# Patient Record
Sex: Male | Born: 1970
Health system: Southern US, Community
[De-identification: ages and names within clinical notes are randomized; demographics above are authoritative.]

## PROBLEM LIST (undated history)

## (undated) DIAGNOSIS — R000 Tachycardia, unspecified: Secondary | ICD-10-CM

## (undated) DIAGNOSIS — F419 Anxiety disorder, unspecified: Secondary | ICD-10-CM

## (undated) DIAGNOSIS — I471 Supraventricular tachycardia, unspecified: Secondary | ICD-10-CM

## (undated) DIAGNOSIS — I519 Heart disease, unspecified: Secondary | ICD-10-CM

## (undated) DIAGNOSIS — F32A Depression, unspecified: Secondary | ICD-10-CM

## (undated) DIAGNOSIS — T8859XA Other complications of anesthesia, initial encounter: Secondary | ICD-10-CM

## (undated) DIAGNOSIS — F329 Major depressive disorder, single episode, unspecified: Secondary | ICD-10-CM

## (undated) DIAGNOSIS — M1990 Unspecified osteoarthritis, unspecified site: Secondary | ICD-10-CM

## (undated) DIAGNOSIS — E785 Hyperlipidemia, unspecified: Secondary | ICD-10-CM

## (undated) DIAGNOSIS — F319 Bipolar disorder, unspecified: Secondary | ICD-10-CM

## (undated) DIAGNOSIS — J45909 Unspecified asthma, uncomplicated: Secondary | ICD-10-CM

## (undated) DIAGNOSIS — I10 Essential (primary) hypertension: Secondary | ICD-10-CM

## (undated) DIAGNOSIS — K602 Anal fissure, unspecified: Secondary | ICD-10-CM

## (undated) DIAGNOSIS — M5030 Other cervical disc degeneration, unspecified cervical region: Secondary | ICD-10-CM

## (undated) DIAGNOSIS — I2510 Atherosclerotic heart disease of native coronary artery without angina pectoris: Secondary | ICD-10-CM

## (undated) DIAGNOSIS — T4145XA Adverse effect of unspecified anesthetic, initial encounter: Secondary | ICD-10-CM

## (undated) DIAGNOSIS — F99 Mental disorder, not otherwise specified: Secondary | ICD-10-CM

## (undated) DIAGNOSIS — D649 Anemia, unspecified: Secondary | ICD-10-CM

## (undated) DIAGNOSIS — K219 Gastro-esophageal reflux disease without esophagitis: Secondary | ICD-10-CM

## (undated) HISTORY — DX: Other cervical disc degeneration, unspecified cervical region: M50.30

## (undated) HISTORY — DX: Hyperlipidemia, unspecified: E78.5

## (undated) HISTORY — DX: Anal fissure, unspecified: K60.2

## (undated) HISTORY — PX: CARDIAC CATHETERIZATION: SHX172

## (undated) HISTORY — DX: Unspecified osteoarthritis, unspecified site: M19.90

## (undated) HISTORY — PX: MECHANICAL AORTIC VALVE REPLACEMENT: SHX2013

## (undated) HISTORY — DX: Supraventricular tachycardia: I47.1

## (undated) HISTORY — DX: Anemia, unspecified: D64.9

## (undated) HISTORY — DX: Gastro-esophageal reflux disease without esophagitis: K21.9

## (undated) HISTORY — PX: BONE TUMOR EXCISION: SHX1254

## (undated) HISTORY — DX: Supraventricular tachycardia, unspecified: I47.10

---

## 2000-11-15 ENCOUNTER — Encounter: Payer: Self-pay | Admitting: Emergency Medicine

## 2000-11-15 ENCOUNTER — Emergency Department (HOSPITAL_COMMUNITY): Admission: EM | Admit: 2000-11-15 | Discharge: 2000-11-15 | Payer: Self-pay | Admitting: Emergency Medicine

## 2002-05-08 ENCOUNTER — Inpatient Hospital Stay (HOSPITAL_COMMUNITY): Admission: EM | Admit: 2002-05-08 | Discharge: 2002-05-14 | Payer: Self-pay | Admitting: Psychiatry

## 2003-05-06 ENCOUNTER — Emergency Department (HOSPITAL_COMMUNITY): Admission: EM | Admit: 2003-05-06 | Discharge: 2003-05-07 | Payer: Self-pay | Admitting: Emergency Medicine

## 2003-05-07 ENCOUNTER — Encounter: Payer: Self-pay | Admitting: Emergency Medicine

## 2003-12-14 ENCOUNTER — Ambulatory Visit (HOSPITAL_COMMUNITY): Admission: RE | Admit: 2003-12-14 | Discharge: 2003-12-14 | Payer: Self-pay | Admitting: Gastroenterology

## 2004-01-12 ENCOUNTER — Ambulatory Visit (HOSPITAL_COMMUNITY): Admission: RE | Admit: 2004-01-12 | Discharge: 2004-01-12 | Payer: Self-pay | Admitting: General Surgery

## 2004-05-16 ENCOUNTER — Emergency Department (HOSPITAL_COMMUNITY): Admission: EM | Admit: 2004-05-16 | Discharge: 2004-05-16 | Payer: Self-pay | Admitting: Emergency Medicine

## 2004-08-15 ENCOUNTER — Emergency Department (HOSPITAL_COMMUNITY): Admission: EM | Admit: 2004-08-15 | Discharge: 2004-08-15 | Payer: Self-pay | Admitting: Family Medicine

## 2004-08-15 ENCOUNTER — Ambulatory Visit (HOSPITAL_COMMUNITY): Admission: RE | Admit: 2004-08-15 | Discharge: 2004-08-15 | Payer: Self-pay | Admitting: Family Medicine

## 2004-11-01 ENCOUNTER — Emergency Department (HOSPITAL_COMMUNITY): Admission: EM | Admit: 2004-11-01 | Discharge: 2004-11-01 | Payer: Self-pay | Admitting: Family Medicine

## 2007-08-04 ENCOUNTER — Ambulatory Visit: Payer: Self-pay | Admitting: *Deleted

## 2007-08-04 ENCOUNTER — Inpatient Hospital Stay (HOSPITAL_COMMUNITY): Admission: RE | Admit: 2007-08-04 | Discharge: 2007-08-10 | Payer: Self-pay | Admitting: *Deleted

## 2008-03-09 ENCOUNTER — Emergency Department (HOSPITAL_COMMUNITY): Admission: EM | Admit: 2008-03-09 | Discharge: 2008-03-09 | Payer: Self-pay | Admitting: Family Medicine

## 2011-03-02 NOTE — Op Note (Signed)
NAMEMarland Kitchen  COPE, MARTE                      ACCOUNT NO.:  [id number]   MEDICAL RECORD NO.:  [id number]                   PATIENT TYPE:  AMB   LOCATION:  DAY                                  FACILITY:  [facility]   PHYSICIAN:  Anselm Pancoast. Zachery Dakins, M.D.          DATE OF BIRTH:  [birth year]   DATE OF PROCEDURE:  01/12/2004  DATE OF DISCHARGE:                                 OPERATIVE REPORT   PREOPERATIVE DIAGNOSIS:  Acute and chronic posterior anal fissure, internal.   POSTOPERATIVE DIAGNOSIS:  Acute and chronic posterior anal fissure,  internal.   OPERATION:  Internal sphincterotomy, excision of anal fissure.   General anesthesia, lithotomy position.   HISTORY:  Max Ward is a 40 year old male who was seen in our office  approximately a week ago by [employer]. Young, M.D., for anal pain and a  posterior fissure was diagnosed.  The patient was given pain medication.  It  did not appear to be improving.  He is on Zoloft for his chronic problems  with anxiety and also Xanax and stated that he had had a hard bowel movement  when the symptoms occurred.  Approximately three or four months ago he had  been seen by Dr. Arlyce Dice, who had done a Botox injection after colonoscopy  for the fissure, and the patient stated that it was better for a few days  but that the symptoms recurred.  Since then he has just had acute recurring  symptoms.  He does [employer] work.  I suggested he receive an internal  sphincterotomy.  The patient has a history of cardiac problems as a child  and I talked with Dr. Aleen Campi, his cardiologist, and he recommended that we  cover him with Unasyn.  He said that he had had a rhythm problem and had had  an ablation, and he had a bicuspid valve, but he did not think any more than  just antibiotic coverage immediately around surgery was needed and it would  be safe to proceed with a general anesthesia as an outpatient.  He was  scheduled for today.   Preoperatively  he was given 3 g of Unasyn.  The patient was quite anxious,  and this goes along with his chronic problems.  He was taken back to the  operative suite, induction of general anesthesia, endotracheal tube, and  then placed in a lithotomy position.  After the patient was fully asleep and  the sphincter relaxed, you could see a posterior acute fissure with a little  sentinel tag posterior.  He stated that this little bump bothered him and he  wanted it excised.  As far as the fissure itself did not look all that  horrible, but I thought that a left lateral sphincterotomy would be better  than actually dividing the sphincter posterior.  A little incision was made  and the internal sphincter was elevated over a hemostat and then divided  with cautery.  There was  a little bleeding that was down deep in the wound.  I could not see it, so I opened the area over the little area where there  was a hemorrhoid, then could visualize the sphincter, the bleeder in the  sphincter.  This was coagulated and sutured with a 2-0 chromic.  After  hemostasis had been obtained, I then closed this little incision, the  incision was probably three-quarters of an inch in length, with two figure-  of-eight sutures of 2-0 chromic and then a 3-0 chromic on the exterior  portion.  I did not actually remove the little hemorrhoid that appears to  have been more irritated by the sphincter spasm than actually significant  hemorrhoids.  Next I directed our attention over to this little posterior  anal tag and removed it.  There was actually two little fissures kind of  right there together, and cleaned the little area with debridement, and then  I actually sutured the mucosal edges with two sutures of 2-0 chromic to  hopefully speed up the postoperative healing period.  I then inspected the  area with an anoscope.  Did put one additional 3-0 chromic suture in the  little area  where I had done the sphincterotomy and then  anesthetized the sphincter with  about 20 mL of Marcaine with adrenalin for postoperative pain relief.  It  appeared that the sphincter internal portion is adequately divided as it was  not tight, and waking up, and we will release him after a short stay in the  recovery room.                                               Anselm Pancoast. Zachery Dakins, M.D.    WJW/MEDQ  D:  01/12/2004  T:  01/12/2004  Job:  [id number]   cc:   Barbette Hair. Arlyce Dice, M.D. [facility]
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
## 2011-07-25 LAB — URINE DRUGS OF ABUSE SCREEN W ALC, ROUTINE (REF LAB)
Barbiturate Quant, Ur: NEGATIVE
Benzodiazepines.: POSITIVE — AB
Cocaine Metabolites: NEGATIVE
Ethyl Alcohol: <5
Methadone: NEGATIVE
Phencyclidine (PCP): NEGATIVE

## 2011-07-25 LAB — BENZODIAZEPINE, QUANTITATIVE, URINE
Alprazolam (GC/LC/MS), ur confirm: 56 ng/mL
Flurazepam GC/MS Conf: NEGATIVE
Oxazepam GC/MS Conf: NEGATIVE

## 2011-07-25 LAB — URINALYSIS, ROUTINE W REFLEX MICROSCOPIC
Bilirubin Urine: NEGATIVE
Glucose, UA: NEGATIVE
Ketones, ur: NEGATIVE
pH: 6

## 2011-07-25 LAB — CBC
HCT: 45.1
Hemoglobin: 14.9
MCHC: 33.1
RDW: 11.6

## 2011-07-25 LAB — TSH: TSH: 2.521

## 2011-07-25 LAB — COMPREHENSIVE METABOLIC PANEL
BUN: 13
Calcium: 9.3
Creatinine, Ser: 1.04
Glucose, Bld: 98
Total Protein: 6.6

## 2011-08-09 DIAGNOSIS — R4589 Other symptoms and signs involving emotional state: Secondary | ICD-10-CM | POA: Insufficient documentation

## 2011-08-09 DIAGNOSIS — I471 Supraventricular tachycardia: Secondary | ICD-10-CM | POA: Insufficient documentation

## 2011-08-09 DIAGNOSIS — R079 Chest pain, unspecified: Secondary | ICD-10-CM | POA: Insufficient documentation

## 2011-08-09 DIAGNOSIS — I4719 Other supraventricular tachycardia: Secondary | ICD-10-CM | POA: Insufficient documentation

## 2011-08-09 DIAGNOSIS — F319 Bipolar disorder, unspecified: Secondary | ICD-10-CM | POA: Diagnosis present

## 2011-08-09 DIAGNOSIS — Z720 Tobacco use: Secondary | ICD-10-CM | POA: Insufficient documentation

## 2011-08-09 DIAGNOSIS — F419 Anxiety disorder, unspecified: Secondary | ICD-10-CM | POA: Insufficient documentation

## 2012-01-17 ENCOUNTER — Emergency Department (HOSPITAL_COMMUNITY)
Admission: EM | Admit: 2012-01-17 | Discharge: 2012-01-18 | Disposition: A | Discharge status: Other Acute Inpatient Hospital | Payer: Medicare Other | Source: Home / Self Care | Attending: Emergency Medicine | Admitting: Emergency Medicine

## 2012-01-17 ENCOUNTER — Encounter (HOSPITAL_COMMUNITY): Payer: Self-pay | Admitting: Licensed Clinical Social Worker

## 2012-01-17 ENCOUNTER — Other Ambulatory Visit: Payer: Self-pay

## 2012-01-17 ENCOUNTER — Encounter (HOSPITAL_COMMUNITY): Payer: Self-pay

## 2012-01-17 DIAGNOSIS — R45851 Suicidal ideations: Secondary | ICD-10-CM | POA: Insufficient documentation

## 2012-01-17 DIAGNOSIS — F319 Bipolar disorder, unspecified: Secondary | ICD-10-CM | POA: Insufficient documentation

## 2012-01-17 HISTORY — DX: Anxiety disorder, unspecified: F41.9

## 2012-01-17 HISTORY — DX: Atherosclerotic heart disease of native coronary artery without angina pectoris: I25.10

## 2012-01-17 HISTORY — DX: Tachycardia, unspecified: R00.0

## 2012-01-17 HISTORY — DX: Bipolar disorder, unspecified: F31.9

## 2012-01-17 LAB — COMPREHENSIVE METABOLIC PANEL
Albumin: 3.9 g/dL (ref 3.5–5.2)
Alkaline Phosphatase: 66 U/L (ref 39–117)
BUN: 11 mg/dL (ref 6–23)
Chloride: 103 mEq/L (ref 96–112)
Potassium: 3.7 mEq/L (ref 3.5–5.1)
Total Bilirubin: 0.4 mg/dL (ref 0.3–1.2)

## 2012-01-17 LAB — RAPID URINE DRUG SCREEN, HOSP PERFORMED
Amphetamines: NOT DETECTED
Barbiturates: NOT DETECTED
Tetrahydrocannabinol: NOT DETECTED

## 2012-01-17 LAB — CBC
HCT: 46 % (ref 39.0–52.0)
Hemoglobin: 16.1 g/dL (ref 13.0–17.0)
RDW: 12.3 % (ref 11.5–15.5)
WBC: 7.7 10*3/uL (ref 4.0–10.5)

## 2012-01-17 LAB — ETHANOL: Alcohol, Ethyl (B): <11 mg/dL (ref 0–11)

## 2012-01-17 MED ORDER — LORAZEPAM 1 MG PO TABS
1.0000 mg | ORAL_TABLET | Freq: Three times a day (TID) | ORAL | Status: DC | PRN
  Administered 2012-01-17: 1 mg via ORAL
  Filled 2012-01-17: qty 1

## 2012-01-17 MED ORDER — ONDANSETRON HCL 4 MG PO TABS: 4.0000 mg | ORAL_TABLET | Freq: Three times a day (TID) | ORAL | Status: DC | PRN

## 2012-01-17 MED ORDER — ACETAMINOPHEN 325 MG PO TABS: 650.0000 mg | ORAL_TABLET | ORAL | Status: DC | PRN

## 2012-01-17 MED ORDER — NICOTINE 21 MG/24HR TD PT24
21.0000 mg | MEDICATED_PATCH | Freq: Every day | TRANSDERMAL | Status: DC
  Administered 2012-01-17 – 2012-01-18 (×2): 21 mg via TRANSDERMAL
  Filled 2012-01-17 (×2): qty 1

## 2012-01-17 MED ORDER — ZOLPIDEM TARTRATE 5 MG PO TABS
5.0000 mg | ORAL_TABLET | Freq: Every evening | ORAL | Status: DC | PRN
  Administered 2012-01-17: 5 mg via ORAL
  Filled 2012-01-17: qty 1

## 2012-01-17 MED ORDER — PANTOPRAZOLE SODIUM 40 MG PO TBEC
40.0000 mg | DELAYED_RELEASE_TABLET | Freq: Every day | ORAL | Status: DC
  Administered 2012-01-17 – 2012-01-18 (×2): 40 mg via ORAL
  Filled 2012-01-17 (×2): qty 1

## 2012-01-17 MED ORDER — IBUPROFEN 600 MG PO TABS
600.0000 mg | ORAL_TABLET | Freq: Three times a day (TID) | ORAL | Status: DC | PRN
  Administered 2012-01-17: 600 mg via ORAL
  Filled 2012-01-17: qty 1

## 2012-01-17 MED ORDER — QUETIAPINE FUMARATE 100 MG PO TABS
900.0000 mg | ORAL_TABLET | Freq: Every day | ORAL | Status: DC
  Administered 2012-01-17: 900 mg via ORAL
  Filled 2012-01-17: qty 9

## 2012-01-17 MED ORDER — DIVALPROEX SODIUM ER 500 MG PO TB24
1000.0000 mg | ORAL_TABLET | Freq: Every day | ORAL | Status: DC
  Administered 2012-01-17: 1000 mg via ORAL
  Filled 2012-01-17 (×2): qty 2

## 2012-01-17 NOTE — ED Notes (Signed)
Pt. Belongings locked up in [place] [address] near the psy-ed. Pt. Has 1 belongings bag and 1 green bag.
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
## 2012-01-17 NOTE — ED Notes (Signed)
EKG given to EDP, Pickering, MD. 
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
## 2012-01-17 NOTE — ED Provider Notes (Signed)
History     CSN: [id number]  Arrival date & time 01/17/12  1946   First MD Initiated Contact with Patient 01/17/12 2055      Chief Complaint  Patient presents with  . Medical Clearance    (Consider location/radiation/quality/duration/timing/severity/associated sxs/prior treatment) Patient is a 41 y.o. male presenting with mental health disorder. The history is provided by the patient.  Mental Health Problem  Pt states he has had a lot of depression. States having problems with his x-wife and kids. States recently has had a lot more anxiety, anger and thoughts about hurting himself. States he was unable to eat or drink for last week because he is worried about everything. States went to [facility], sent here for med clearance. Pt denies medical problems. No other complaints.   Past Medical History  Diagnosis Date  . Heart disease   . Anxiety   . Coronary artery disease   . Bipolar 1 disorder   . Tachycardia     Past Surgical History  Procedure Date  . Cardiac catheterization     No family history on file.  History  Substance Use Topics  . Smoking status: Current Everyday Smoker -- 1.0 packs/day for 15 years  . Smokeless tobacco: Not on file  . Alcohol Use: Yes      Review of Systems  Constitutional: Negative for fever and chills.  HENT: Negative.   Eyes: Negative.   Respiratory: Negative.   Cardiovascular: Negative.   Gastrointestinal: Negative.   Genitourinary: Negative.   Musculoskeletal: Negative.   Skin: Negative.   Neurological: Negative.   Psychiatric/Behavioral: Positive for behavioral problems. The patient is nervous/anxious.     Allergies  Review of patient's allergies indicates no known allergies.  Home Medications   Current Outpatient Rx  Name Route Sig Dispense Refill  . ACETAMINOPHEN 325 MG PO TABS Oral Take 650 mg by mouth every 6 (six) hours as needed. pain    . ALPRAZOLAM 1 MG PO TABS Oral Take 1-3 mg by mouth See admin instructions. Takes 1  tablet tid ,and takes 3 tablets at bedtime    . DIVALPROEX SODIUM ER 500 MG PO TB24 Oral Take 1,000 mg by mouth at bedtime.    Marland Kitchen ESOMEPRAZOLE MAGNESIUM 40 MG PO CPDR Oral Take 40 mg by mouth 2 (two) times daily.    . MOMETASONE FUROATE 50 MCG/ACT NA SUSP Nasal Place 1-2 sprays into the nose daily.    . OXYCODONE-ACETAMINOPHEN 7.5-325 MG PO TABS Oral Take 1 tablet by mouth every 6 (six) hours as needed. pain    . QUETIAPINE FUMARATE 300 MG PO TABS Oral Take 900 mg by mouth at bedtime.    Marland Kitchen RAMELTEON 8 MG PO TABS Oral Take 8 mg by mouth at bedtime.      BP 132/93  Pulse 99  Temp(Src) 97.9 F (36.6 C) (Oral)  Resp 20  SpO2 100%  Physical Exam  Nursing note and vitals reviewed. Constitutional: He is oriented to person, place, and time. He appears well-developed and well-nourished. No distress.  HENT:  Head: Normocephalic.  Eyes: Conjunctivae are normal.  Neck: Neck supple.  Cardiovascular: Normal rate, regular rhythm and normal heart sounds.   Pulmonary/Chest: Effort normal and breath sounds normal. No respiratory distress.  Abdominal: Soft. Bowel sounds are normal. There is no tenderness.  Neurological: He is alert and oriented to person, place, and time.  Skin: Skin is warm and dry.  Psychiatric: His speech is normal. Judgment and thought content normal. His mood appears  anxious. Cognition and memory are normal. He exhibits a depressed mood.    ED Course  Procedures (including critical care time)  Results for orders placed during the hospital encounter of 01/17/12  CBC      Component Value Range   WBC 7.7  4.0 - 10.5 (K/uL)   RBC 5.26  4.22 - 5.81 (MIL/uL)   Hemoglobin 16.1  13.0 - 17.0 (g/dL)   HCT 40.9  81.1 - 91.4 (%)   MCV 87.5  78.0 - 100.0 (fL)   MCH 30.6  26.0 - 34.0 (pg)   MCHC 35.0  30.0 - 36.0 (g/dL)   RDW 78.2  95.6 - 21.3 (%)   Platelets 251  150 - 400 (K/uL)  COMPREHENSIVE METABOLIC PANEL      Component Value Range   Sodium 137  135 - 145 (mEq/L)    Potassium 3.7  3.5 - 5.1 (mEq/L)   Chloride 103  96 - 112 (mEq/L)   CO2 25  19 - 32 (mEq/L)   Glucose, Bld 109 (*) 70 - 99 (mg/dL)   BUN 11  6 - 23 (mg/dL)   Creatinine, Ser 0.86  0.50 - 1.35 (mg/dL)   Calcium 9.1  8.4 - 57.8 (mg/dL)   Total Protein 6.9  6.0 - 8.3 (g/dL)   Albumin 3.9  3.5 - 5.2 (g/dL)   AST 12  0 - 37 (U/L)   ALT 18  0 - 53 (U/L)   Alkaline Phosphatase 66  39 - 117 (U/L)   Total Bilirubin 0.4  0.3 - 1.2 (mg/dL)   GFR calc non Af Amer >90  >90 (mL/min)   GFR calc Af Amer >90  >90 (mL/min)  ETHANOL      Component Value Range   Alcohol, Ethyl (B) <11  0 - 11 (mg/dL)   No results found.  9:40 PM pts vitals are normal. He is in no distressed. Spoke with ACT. Pt has already been assessed by [facility]. Accepted? But No beds at this time.   No diagnosis found.    MDM          Lottie Mussel, PA 01/21/12 1547

## 2012-01-18 ENCOUNTER — Inpatient Hospital Stay (HOSPITAL_COMMUNITY)
Admission: RE | Admit: 2012-01-18 | Discharge: 2012-01-25 | DRG: 885 | Disposition: A | Discharge status: Home / Self Care | Payer: Medicare Other | Attending: Psychiatry | Admitting: Psychiatry

## 2012-01-18 ENCOUNTER — Encounter (HOSPITAL_COMMUNITY): Payer: Self-pay | Admitting: *Deleted

## 2012-01-18 DIAGNOSIS — R45851 Suicidal ideations: Secondary | ICD-10-CM

## 2012-01-18 DIAGNOSIS — I2510 Atherosclerotic heart disease of native coronary artery without angina pectoris: Secondary | ICD-10-CM | POA: Diagnosis present

## 2012-01-18 DIAGNOSIS — G4700 Insomnia, unspecified: Secondary | ICD-10-CM | POA: Diagnosis present

## 2012-01-18 DIAGNOSIS — F250 Schizoaffective disorder, bipolar type: Secondary | ICD-10-CM

## 2012-01-18 DIAGNOSIS — F259 Schizoaffective disorder, unspecified: Principal | ICD-10-CM | POA: Diagnosis present

## 2012-01-18 DIAGNOSIS — F319 Bipolar disorder, unspecified: Secondary | ICD-10-CM | POA: Diagnosis present

## 2012-01-18 DIAGNOSIS — F17200 Nicotine dependence, unspecified, uncomplicated: Secondary | ICD-10-CM | POA: Diagnosis present

## 2012-01-18 DIAGNOSIS — F411 Generalized anxiety disorder: Secondary | ICD-10-CM | POA: Diagnosis present

## 2012-01-18 HISTORY — DX: Heart disease, unspecified: I51.9

## 2012-01-18 HISTORY — DX: Mental disorder, not otherwise specified: F99

## 2012-01-18 HISTORY — DX: Depression, unspecified: F32.A

## 2012-01-18 HISTORY — DX: Major depressive disorder, single episode, unspecified: F32.9

## 2012-01-18 LAB — VALPROIC ACID LEVEL: Valproic Acid Lvl: 29.6 ug/mL — ABNORMAL LOW (ref 50.0–100.0)

## 2012-01-18 MED ORDER — QUETIAPINE FUMARATE 400 MG PO TABS
800.0000 mg | ORAL_TABLET | Freq: Every day | ORAL | Status: DC
  Administered 2012-01-18 – 2012-01-20 (×3): 800 mg via ORAL
  Filled 2012-01-18 (×4): qty 2

## 2012-01-18 MED ORDER — PANTOPRAZOLE SODIUM 40 MG PO TBEC
40.0000 mg | DELAYED_RELEASE_TABLET | Freq: Every day | ORAL | Status: DC
  Administered 2012-01-18 – 2012-01-24 (×7): 40 mg via ORAL
  Filled 2012-01-18: qty 1
  Filled 2012-01-18: qty 14
  Filled 2012-01-18 (×4): qty 1
  Filled 2012-01-18: qty 14
  Filled 2012-01-18 (×3): qty 1

## 2012-01-18 MED ORDER — MAGNESIUM HYDROXIDE 400 MG/5ML PO SUSP: 30.0000 mL | Freq: Every day | ORAL | Status: DC | PRN

## 2012-01-18 MED ORDER — ALUM & MAG HYDROXIDE-SIMETH 200-200-20 MG/5ML PO SUSP: 30.0000 mL | ORAL | Status: DC | PRN

## 2012-01-18 MED ORDER — ACETAMINOPHEN 325 MG PO TABS: 650.0000 mg | ORAL_TABLET | Freq: Four times a day (QID) | ORAL | Status: DC | PRN

## 2012-01-18 MED ORDER — QUETIAPINE FUMARATE 300 MG PO TABS: 900.0000 mg | ORAL_TABLET | Freq: Every day | ORAL | Status: DC

## 2012-01-18 MED ORDER — DIVALPROEX SODIUM ER 500 MG PO TB24
1000.0000 mg | ORAL_TABLET | Freq: Every day | ORAL | Status: DC
  Administered 2012-01-18 – 2012-01-20 (×3): 1000 mg via ORAL
  Filled 2012-01-18 (×4): qty 2

## 2012-01-18 MED ORDER — ACETAMINOPHEN 325 MG PO TABS
650.0000 mg | ORAL_TABLET | Freq: Four times a day (QID) | ORAL | Status: DC | PRN
  Administered 2012-01-21: 650 mg via ORAL

## 2012-01-18 MED ORDER — RAMELTEON 8 MG PO TABS
8.0000 mg | ORAL_TABLET | Freq: Every day | ORAL | Status: DC
  Administered 2012-01-18 – 2012-01-20 (×3): 8 mg via ORAL
  Filled 2012-01-18 (×4): qty 1

## 2012-01-18 MED ORDER — TRAZODONE HCL 50 MG PO TABS
150.0000 mg | ORAL_TABLET | Freq: Every evening | ORAL | Status: DC | PRN
  Administered 2012-01-18: 150 mg via ORAL
  Filled 2012-01-18: qty 3

## 2012-01-18 NOTE — Progress Notes (Addendum)
Patient ID: Max Ward, male   DOB: Sep 10, 1971, 41 y.o.   MRN: [id number] Pt is awake and active on the unit this PM. Pt denies SI/HI and AVH. Pt is interacting well in the milieu and is cooperative with staff. Pt c/o non compliance with his medications due to weight gain and would like to learn about alternatives. Pt mood and affect are appropriate to the situation. Writer will continue to monitor.
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
## 2012-01-18 NOTE — ED Provider Notes (Signed)
Patient is here with complaints of depression and suicidal ideation. Patient still awaiting evaluation by the psychiatric team.  Filed Vitals:   01/18/12 0511  BP: 120/72  Pulse: 90  Temp: 97.9 F (36.6 C)  Resp: 20   Heart: Regular rate and rhythm Lungs: Clear to auscultation  We are awaiting evaluation by the ACT team. Plan at this time is for psychiatric admission  Celene Kras, MD 01/18/12 [phone]

## 2012-01-18 NOTE — ED Notes (Signed)
Pt accepted to [facility]. Verne Spurr, PA to Dr. Allena Katz ([phone]). Completed support documentation. Updated RN & EDP.

## 2012-01-19 DIAGNOSIS — F3160 Bipolar disorder, current episode mixed, unspecified: Secondary | ICD-10-CM

## 2012-01-19 LAB — TSH: TSH: 1.735 u[IU]/mL (ref 0.350–4.500)

## 2012-01-19 MED ORDER — CHLORDIAZEPOXIDE HCL 25 MG PO CAPS
25.0000 mg | ORAL_CAPSULE | Freq: Four times a day (QID) | ORAL | Status: DC | PRN
  Administered 2012-01-21 – 2012-01-25 (×4): 25 mg via ORAL
  Filled 2012-01-19 (×4): qty 1

## 2012-01-19 MED ORDER — NICOTINE 21 MG/24HR TD PT24
21.0000 mg | MEDICATED_PATCH | Freq: Every day | TRANSDERMAL | Status: DC
  Administered 2012-01-19: 21 mg via TRANSDERMAL
  Filled 2012-01-19 (×4): qty 1

## 2012-01-19 MED ORDER — TRAZODONE HCL 100 MG PO TABS
200.0000 mg | ORAL_TABLET | Freq: Every evening | ORAL | Status: DC | PRN
  Administered 2012-01-19 – 2012-01-20 (×2): 200 mg via ORAL
  Filled 2012-01-19 (×2): qty 2

## 2012-01-19 NOTE — Progress Notes (Signed)
Pt slept most of the morning   He is up active on the milue  His thinking is logical and coherent  His interactions are appropriate  He is not suicidal or homicidal  And denies psychotic symptoms   Verbal support given  Medications administered and effectiveness monitored  Q 15 min checks  Pt safe at present

## 2012-01-19 NOTE — Progress Notes (Signed)
[facility] Group Notes:  (Counselor/Nursing/MHT/Case Management/Adjunct)  01/19/2012 3:54 PM  Type of Therapy:  Group Therapy  Participation Level:  Active  Participation Quality:  Appropriate, Attentive and Sharing  Affect:  Appropriate  Cognitive:  Appropriate  Insight:  Good  Engagement in Group:  Good  Engagement in Therapy:  Good  Modes of Intervention:  Clarification, Problem-solving, Socialization and Support  Summary of Progress/Problems: pt. Participated in counseling group on self sabotaging behaviors and discussed how he tends to live in the past and is unable to move forward. Pt. Discussed changing negative thoughts to positive and moving forward instead of looking back in the past.   Neila Gear 01/19/2012, 3:54 PM
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
## 2012-01-19 NOTE — Progress Notes (Signed)
[facility] Group Notes:  (Counselor/Nursing/MHT/Case Management/Adjunct)  01/19/2012 3:47 PM  Type of Therapy:  Discharge Planning  Participation Level:  Active  Participation Quality:  Appropriate and Attentive  Affect:  Appropriate  Cognitive:  Appropriate  Insight:  Good  Engagement in Group:  Good  Engagement in Therapy:  Good  Modes of Intervention:  Clarification, Problem-solving, Socialization and   Summary of Progress/Problems: Pt. . participated in after care planning group and was given the [facility]  pamphlet and crisis and hotline numbers and agreed to use them if needed. The pt. Stated he came from [facility] to [facility] yesterday. Pt. stated he was having an okay day and talked about his medications and his issues with Bipolar.   Neila Gear 01/19/2012, 3:47 PM
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
## 2012-01-19 NOTE — Progress Notes (Signed)
Pt in bed when I took over his care at 2330.  Pt unable to sleep, called doctor and received order.  Pt pleasant and cooperative.  Did stated that he had tried Trazodone before and it stopped up his nose. Did go ahead and take it tonight because he sated "I have to get some sleep".  Denies SI/HI/hallucinations at this time.  Support and encouragement offered, will continue to monitor.

## 2012-01-19 NOTE — Progress Notes (Signed)
Writer observed patient sitting in the dayroom watching tv. Writer met with patient 1:1 and informed patient of his medications and pt. C/o insomnia. Patient was informed of prn of trazadone available and patient requested this along with his scheduled hs medications. Writer encouraged patient to take his hs medications first and if he still had trouble sleeping or woke up later on he would have trazadone available but he requested trazadone with his hs medications. Patient currently denies having pain, -si/hi/a/v hall. Safety maintained on unit, will continue to monitor.

## 2012-01-20 MED ORDER — NICOTINE 21 MG/24HR TD PT24
21.0000 mg | MEDICATED_PATCH | Freq: Every day | TRANSDERMAL | Status: DC
  Filled 2012-01-20: qty 1

## 2012-01-20 NOTE — Progress Notes (Signed)
Pt. attended and participated in aftercare planning group. Pt. accepted information on suicide prevention, warning signs to look for with suicide and crisis line numbers to use. The pt. agreed to call crisis line numbers if having warning signs or having thoughts of suicide. Pt. listed their current energy level as "middle". He avoided eye contact and was unable to stay in group very long.

## 2012-01-20 NOTE — Progress Notes (Addendum)
[facility] Group Notes:  (Counselor/Nursing/MHT/Case Management/Adjunct)  01/20/2012 10:30 AM  Type of Therapy:  Group Therapy, Dance/Movement Therapy   Participation Level:  Minimal  Participation Quality:  Drowsy and Supportive  Affect:  Depressed  Cognitive:  Confused  Insight:  Limited  Engagement in Group:  Limited  Engagement in Therapy:  None  Modes of Intervention:  Clarification, Problem-solving, Role-play, Socialization and Support  Summary of Progress/Problems: pt spoke about supports to use at D/C. Pt was confused and unable to stay in group for very long and left after a few minutes to go "ay down".    Gevena Mart
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
## 2012-01-20 NOTE — Progress Notes (Signed)
Pt has been pleasant and appropriate   He attends and participates in groups  He denies suicidal and homicidal ideation   He is cooperative with treatment and is logical and coherent   Verbal suppoprt given  Medications administered and effectiveness monitored  Q 15 min checks  Pt safe at present

## 2012-01-20 NOTE — H&P (Signed)
I have read the H&P, interviewed the patient, and I agree with the findings above.  Iyannah Blake, MD   
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
## 2012-01-20 NOTE — Progress Notes (Signed)
[facility] Adult Inpatient Family/Significant Other Suicide Prevention Education  Suicide Prevention Education:  Education Completed; John Hutherson-brother-[phone]- has been identified by the patient as the family member/significant other with whom the patient will be residing, and identified as the person(s) who will aid the patient in the event of a mental health crisis (suicidal ideations/suicide attempt).  With written consent from the patient, the family member/significant other has been provided the following suicide prevention education, prior to the and/or following the discharge of the patient.  The suicide prevention education provided includes the following:  Suicide risk factors  Suicide prevention and interventions  National Suicide Hotline telephone number  [facility] Inpatient assessment telephone number  [facility] Emergency Assistance 911  [facility] and/or Residential Mobile Crisis Unit telephone number  Request made of family/significant other to:  Remove weapons (e.g., guns, rifles, knives), all items previously/currently identified as safety concern. Pt.'s brother states that there are no guns in the home.   Remove drugs/medications (over-the-counter, prescriptions, illicit drugs), all items previously/currently identified as a safety concern. Pt.'s brother  states he has no concerns and will secure the home before he leaves for [place] today for his job.   Pt.'s brother will be leaving to go out of town for his job in [place]. Pt.'s brother reports past SI attempt  and SI thoughts in past with the pt.  The family member/significant other verbalizes understanding of the suicide prevention education information provided.  The family member/significant other agrees to remove the items of safety concern listed above.  Lamar Blinks [place] 01/20/2012, 12:18 PM
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
## 2012-01-21 MED ORDER — QUETIAPINE FUMARATE 300 MG PO TABS
600.0000 mg | ORAL_TABLET | Freq: Every day | ORAL | Status: DC
  Administered 2012-01-21 – 2012-01-24 (×4): 600 mg via ORAL
  Filled 2012-01-21: qty 2
  Filled 2012-01-21: qty 28
  Filled 2012-01-21 (×3): qty 2
  Filled 2012-01-21: qty 28
  Filled 2012-01-21: qty 2

## 2012-01-21 MED ORDER — NICOTINE 14 MG/24HR TD PT24
14.0000 mg | MEDICATED_PATCH | Freq: Every day | TRANSDERMAL | Status: DC
  Administered 2012-01-21 – 2012-01-25 (×5): 14 mg via TRANSDERMAL
  Filled 2012-01-21 (×8): qty 1

## 2012-01-21 MED ORDER — QUETIAPINE FUMARATE 50 MG PO TABS
50.0000 mg | ORAL_TABLET | Freq: Three times a day (TID) | ORAL | Status: DC
  Administered 2012-01-21 – 2012-01-24 (×8): 50 mg via ORAL
  Filled 2012-01-21 (×11): qty 1

## 2012-01-21 MED ORDER — DIVALPROEX SODIUM ER 500 MG PO TB24
1500.0000 mg | ORAL_TABLET | Freq: Every day | ORAL | Status: DC
  Administered 2012-01-21 – 2012-01-24 (×4): 1500 mg via ORAL
  Filled 2012-01-21 (×2): qty 3
  Filled 2012-01-21: qty 42
  Filled 2012-01-21: qty 3
  Filled 2012-01-21: qty 42
  Filled 2012-01-21: qty 3

## 2012-01-21 NOTE — Progress Notes (Signed)
Pt told MHT that he would like to have his xanax re-ordered because it is what helps when he has "chest pain".  Pt was taking xanax 1 mg TID and 3 mg at HS at home per prescription.  He wishes to speak to the doctor about this tomorrow.  Pt does have anxiety attacks, he reports.

## 2012-01-21 NOTE — Progress Notes (Signed)
Pt pleasant on approach, positive for group this evening.  Pt played cards in dayroom appropriately with peer.  No complaints voiced, does continue to ask for his xanax that he was taking at home.  Denies SI/HI/hallucinaitons.  Support and encouragement offered, will continue to monitor.

## 2012-01-21 NOTE — Progress Notes (Signed)
[facility] Group Notes:  (Counselor/Nursing/MHT/Case Management/Adjunct)  01/21/2012 2:45 PM  Type of Therapy:  Group Therapy at 11:00 and 1:15  Participation Level:  Did Not Attend  Max Ward 01/21/2012, 2:45 PM

## 2012-01-21 NOTE — Discharge Planning (Addendum)
Max Ward attended AM group, good participation.  States he is here due to SI; vague about precipitant other than to say he has multiple stressors. Denies substance use/abuse.  States he was supposed to be on 500 but they were full.  Says family is not supportive, but then states he stays with brother.  Sees Dr Betti Cruz at [facility]; been with him since 39.  No individual therapist as he cannot afford it.  Open to referral to [facility]. Per State Regulation 482.30 This chart was reviewed for medical necessity with respect to the patient's Admission/Duration of stay. Max Ward, [place]  01/21/2012  Next Review Date:  01/24/12

## 2012-01-21 NOTE — Progress Notes (Signed)
[facility] MD Progress Note  01/21/2012 2:50 PM   S/O: Patient seen and evaluated. Chart reviewed. Patient stated that his mood was "ok". His affect was mood congruent and euthymic. He denied any current thoughts of self injurious behavior, suicidal ideation or homicidal ideation. Past Hx reported mania and psychotic s/s.  There were no auditory or visual hallucinations, paranoia or mania noted at this time.  Thought process was linear and goal directed.  No psychomotor agitation or retardation was noted. His speech was normal rate, tone and volume. Eye contact was good. Judgment and insight are fair.  Patient has been up and engaged on the unit.  No acute safety concerns reported from team.  Sleep:  Number of Hours: 4.5    Vital Signs:Blood pressure 113/76, pulse 101, temperature 96.9 F (36.1 C), temperature source Oral, resp. rate 18, height 5' 7.5" (1.715 m), weight 90.719 kg (200 lb), SpO2 96.00%.  Lab Results:  Results for orders placed during the hospital encounter of 01/18/12 (from the past 48 hour(s))  VALPROIC ACID LEVEL     Status: Abnormal   Collection Time   01/21/12  6:10 AM      Component Value Range Comment   Valproic Acid Lvl <10.0 (*) 50.0 - 100.0 (ug/mL)     A/P: Schizoaffective Disorder, Bipolar Type per Hx  1. Continue current medications with increase in Depakote to 1500 mg qhs per dosage that Dr. Betti Cruz had pt on in past.  Also, changed Seroquel from 800 mg qhs to 600 mg  qhs and 50 mg tid.  Also, discontinued Rozerem & Trazodone to decrease polypharmacy.  Va level suspiciously low, repeat ordered for am.  Pt denied cheeking medication.  Medication education completed.  Pros, cons, risks, potential side effects and benefits were discussed with pt.  Pt agreeable with the plan.   2. Repeat Depakote level tomorrow as noted above and adjust dose as appropriate. 3. Encouraged participation in groups. 4. Maintain standard level of observation. 5. Pt may program on [facility]. 6.  Collateral information pending. 7. EKG ordered to r/o cardiac origin of reported CP.  CP most likely anxiety, past c/o of relief with Xanax.  Improved s/p our interview.  No reported SOB, N, V, D, UE weakness/numbness or HA.  Will follow. 8. Potential transfer to [facility] if bed becomes available.  Lupe Carney 01/21/2012, 2:50 PM

## 2012-01-21 NOTE — Progress Notes (Signed)
[facility] Group Notes:  (Counselor/Nursing/MHT/Case Management/Adjunct)  01/21/2012 12:36 PM  Type of Therapy:  Group Therapy  Participation Level:  Did Not Attend   Clide Dales 01/21/2012, 12:36 PM  [facility] Group Notes:  (Counselor/Nursing/MHT/Case Management/Adjunct)  01/21/2012 12:37 PM  Type of Therapy:  Group Therapy at 1:15  Participation Level:  Minimal  Participation Quality:  Attentive  Affect:  Flat  Cognitive:  Oriented  Insight:  None shared  Engagement in Group:  Limited  Engagement in Therapy:  Limited  Modes of Intervention:  Orientation and Socialization  Summary of Progress/Problems:  Panayiotis attended entire session which included representative from [facility] sharing his own story and specifics on support available from [facility].    Clide Dales 01/21/2012, 12:37 PM

## 2012-01-22 LAB — VALPROIC ACID LEVEL: Valproic Acid Lvl: 72.1 ug/mL (ref 50.0–100.0)

## 2012-01-22 MED ORDER — TRAZODONE HCL 50 MG PO TABS
50.0000 mg | ORAL_TABLET | Freq: Every evening | ORAL | Status: DC | PRN
  Administered 2012-01-23 – 2012-01-24 (×2): 50 mg via ORAL
  Filled 2012-01-22 (×2): qty 1
  Filled 2012-01-22: qty 14

## 2012-01-22 NOTE — Progress Notes (Signed)
Patient ID: Max Ward, male   DOB: 08-27-1971, 41 y.o.   MRN: [id number]  Patient was pleasant and cooperative during the assessment. Informed the writer that he may be discharged on Thursday, the 11th. States that he plans to attend more groups tomorrow. "I need to learn to let the past go." Says he sits and thinks about things that we "wish he hadn't done and things he wished he had done.". States his plans are to live with a brother and brother's gf until his "Other brother comes back from [place]". "I wish I had never gotten married"  Pt spent most of the assessment talking about his wife and her current lover. Explained events leading up to their separation, and what he hopes happens with their relationship in the future. Writer attempted to encourage pt to move forward, and attempt to be co parents for his children. Support and encouragement was offered.

## 2012-01-22 NOTE — Progress Notes (Signed)
Patient ID: Max Ward, male   DOB: [birth year], 41 y.o.   MRN: [id number] Pt was irritable, anxious and agitated when speaking to the writer about his meds. Was upset that that his sleep med had been discharged. Informed the pt that his depakote and seroquel had been increased. "I take more than that at home".  Writer encouraged pt to speak to his Dr in the a.m. Support and encouragement was offered.

## 2012-01-22 NOTE — Progress Notes (Signed)
[facility] Group Notes:  (Counselor/Nursing/MHT/Case Management/Adjunct)  01/22/2012 5:55 PM  Type of Therapy:  Group Therapy at 11:00 and 1:15  Participation Level:  Did Not Attend  Clide Dales 01/22/2012, 5:55 PM

## 2012-01-22 NOTE — Progress Notes (Signed)
Pt rated both his depression and hopelessness both a 2 and when asked about anxiety he stated,"I don't feel like I am stressed out yet" He does have a order for prn trazodone to start tonight and to program on 500 hall.  He has not been up for many groups today.  He has remained in his bed but did get up for meals.  He is very somatic and now he is insisting that he has reflux so bad that he needs to have more medication than has been ordered.  He plans to talk with the doctor tomorrow to see if she will either raise the dosage he is on or change it altogether. He has been playing cards with his visitor in his room since coming back from dinner.  Pt had told the treatment team this morning that he was having some "stomach upset" but did not request any medication to alleviate his symptoms.  No other complaints voiced.

## 2012-01-22 NOTE — Progress Notes (Signed)
[facility] MD Progress Note  01/22/2012 1:52 PM   S/O: Patient seen and evaluated in team. Chart reviewed. Patient stated that his mood was "bad because his stomach was upset". His affect was mood congruent and dysthymic. He denied any current thoughts of self injurious behavior, suicidal ideation or homicidal ideation. Past Hx reported mania and psychotic s/s.  There were no auditory or visual hallucinations, paranoia or mania noted at this time.  Thought process was linear and goal directed.  No psychomotor agitation or retardation was noted. His speech was normal rate, tone and volume. Eye contact was good. Judgment and insight are fair.  Patient has been up and engaged on the unit.  No acute safety concerns reported from team.  Slept better last night.  Noted main problems with "racing thoughts and decreased sleep" in past.  Sleep:  Number of Hours: 5.5    Vital Signs:Blood pressure 128/89, pulse 99, temperature 97.2 F (36.2 C), temperature source Oral, resp. rate 18, height 5' 7.5" (1.715 m), weight 90.719 kg (200 lb), SpO2 95.00%.  Lab Results:  Results for orders placed during the hospital encounter of 01/18/12 (from the past 48 hour(s))  VALPROIC ACID LEVEL     Status: Abnormal   Collection Time   01/21/12  6:10 AM      Component Value Range Comment   Valproic Acid Lvl <10.0 (*) 50.0 - 100.0 (ug/mL)   VALPROIC ACID LEVEL     Status: Normal   Collection Time   01/21/12  8:04 PM      Component Value Range Comment   Valproic Acid Lvl 53.5  50.0 - 100.0 (ug/mL)   VALPROIC ACID LEVEL     Status: Normal   Collection Time   01/22/12  6:15 AM      Component Value Range Comment   Valproic Acid Lvl 72.1  50.0 - 100.0 (ug/mL)     A/P: Schizoaffective Disorder, Bipolar Type per Hx  1. Continue current medications which were changed yesterday.    2. Repeat Depakote level as noted above. 3. Encouraged participation in groups. 4. Maintain standard level of observation. 5. Pt may program on 500  Hall. 6. Collateral information pending.  Lupe Carney 01/22/2012, 1:52 PM

## 2012-01-22 NOTE — Treatment Plan (Signed)
Interdisciplinary Treatment Plan Update (Adult)  Date: 01/22/2012  Time Reviewed: 9:24 AM   Progress in Treatment: Attending groups: Yes Participating in groups: Yes Taking medication as prescribed: Yes Tolerating medication: Yes   Family/Significant other contact made: No  Patient understands diagnosis:  Yes As evidenced by asking for help with mood stabilization Discussing patient identified problems/goals with staff:  Yes  See below Medical problems stabilized or resolved:  Yes Denies suicidal/homicidal ideation: Yes  In tx team Issues/concerns per patient self-inventory:  Yes  C/O upset stomach-thinks he might have a stomach bug Other:  New problem(s) identified: N/A  Reason for Continuation of Hospitalization: Depression Medication stabilization  Interventions implemented related to continuation of hospitalization: Work with medications to address symptoms, encourage group attendance and participation on 500 hall  Additional comments:  Estimated length of stay:2-3 days Discharge Plan:Return home, follow up outpt  New goal(s): N/A  Review of initial/current patient goals per problem list:   1.  Goal(s):Eliminate SI  Met:  Yes  Target date:4/8  As evidenced WJ:XBJY report  2.  Goal (s):Decrease depression  Met:  No  Target date:4/11  As evidenced NW:GNFA inventory rating of 4 or less  3.  Goal(s):Identify comprehensive mental health wellness plan  Met:  Yes  Target date:4/9  As evidenced OZ:HYQMV reports he will return to his psychiatrist, outpt therapist and [facility] the [facility]  4.  Goal(s):  Met:  No  Target date:  As evidenced by:  Attendees: Patient:  Max Ward 01/22/2012 9:24 AM  Family:     Physician:  Lupe Carney 01/22/2012 9:24 AM   Nursing: Robbie Louis   01/22/2012 9:24 AM   Case Manager:  Richelle Ito, LCSW 01/22/2012 9:24 AM   Counselor:  Ronda Fairly, LCSWA 01/22/2012 9:24 AM   Other:     Other:     Other:       Other:      Scribe for Treatment Team:   Ida Rogue, 01/22/2012 9:24 AM

## 2012-01-23 NOTE — Progress Notes (Signed)
[facility] Group Notes:  (Counselor/Nursing/MHT/Case Management/Adjunct)  01/23/2012 2:20 PM  Type of Therapy:  Group Therapy  Participation Level:  Did Not Attend  Wilmon Arms 01/23/2012, 2:20 PM  Cosigned by Carney Bern, LCSWA 4/10/20132:44 PM

## 2012-01-23 NOTE — Progress Notes (Signed)
Patient appeared bright on approach. Reported doing well. Mood and affect seemed appropriate. He denied SI/Hi and denied hallucinations. He is attending groups. Pt reported more improvement in mood and less racing thoughts; "since they increased my Depakote". Pt encouraged and supported. Q 15 minute check continues to maintain safety.

## 2012-01-23 NOTE — Progress Notes (Signed)
Pt was in bed upon first assessment.  He did fill out his self-inventory and rated both his depression and hopelessness a 1 and denied any anxiety today.  He denies any S/H ideation or A/V hallucinations.  He did not want to get up for discharge planning group he stated,"I don't go to groups over here"  Explained to him that this is the only chance he has to meet with his case manager so it is important to attend.  He finally got up but left before group was over.  When the MHT asked why he left he stated,"I don't want to hear what everyone else is saying"  He then tried to say he felt it was d/t his medication making him "sleepy".  Pt's discharge plan is to live with his one brother until his other brother comes back from [place].  He plans to see Dr. Betti Cruz at [facility].  No complaints voiced thus far except feeling drowsy from his medication.  He plans to talk with the doctor to see if she will decrease his seroquel to 25 mg TID from 50 mg TID.
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
## 2012-01-23 NOTE — Discharge Planning (Addendum)
Pt was present in morning group. Pt states that his Depakote levels are up and that he feels like he will be ready to d/c tomorrow. Pt will f/u with his psychiatrist Dr. Betti Cruz at [facility] on Monday 4/15 and plans to start counseling sessions with Dedra Skeens, a counselor at [facility]. Per State Regulation 482.30 This chart was reviewed for medical necessity with respect to the patient's Admission/Duration of stay. Daryel Gerald, [place]  01/23/2012  Next Review Date:  01/25/2012

## 2012-01-24 MED ORDER — QUETIAPINE FUMARATE 25 MG PO TABS
25.0000 mg | ORAL_TABLET | Freq: Three times a day (TID) | ORAL | Status: DC
  Administered 2012-01-24 – 2012-01-25 (×3): 25 mg via ORAL
  Filled 2012-01-24 (×4): qty 1
  Filled 2012-01-24 (×3): qty 9
  Filled 2012-01-24 (×2): qty 1

## 2012-01-24 NOTE — Progress Notes (Signed)
Pt laying in bed resting with eyes closed. Respirations even and unlabored. No distress noted.  
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
## 2012-01-24 NOTE — Treatment Plan (Signed)
Interdisciplinary Treatment Plan Update (Adult)  Date: 01/24/2012  Time Reviewed: 9:44 AM   Progress in Treatment: Attending groups: Yes Participating in groups: Yes Taking medication as prescribed: Yes Tolerating medication: Yes   Family/Significant othe contact made:   Patient understands diagnosis:  Yes Discussing patient identified problems/goals with staff:  Yes Medical problems stabilized or resolved:  Yes Denies suicidal/homicidal ideation: Yes  In tx team Issues/concerns per patient self-inventory:  Yes  Seroquel is making me sleepy  Other:  New problem(s) identified: N/A  Reason for Continuation of Hospitalization:   Interventions implemented related to continuation of hospitalization:   Additional comments:  Estimated length of stay: D/C today, possibly tomorrow if not able to get results from Depakote level  Discharge Plan: Return home, follow up outpt.,    New goal(s): N/A  Review of initial/current patient goals per problem list:   1.  Goal(s):Eliminate SI  Met:  Yes  Target date: 4/8  As evidenced ZO:XWRU report  2.  Goal (s): Decrease depression  Met:  Yes  Target date:4/11  As evidenced by: Rating on self inventory of 1  3.  Goal(s): Identify comprehensive mental health wellness plan  Met:  Yes  Target date:4/11  As evidenced by: Chanetta Marshall states he will follow up with [facility] and [facility] groups  4.  Goal(s):  Met:  Yes  Target date:  As evidenced by:  Attendees: Patient:  Max Ward 01/24/2012 9:44 AM  Family:     Physician:  Lupe Carney 01/24/2012 9:44 AM   Nursing:  Manuela Schwartz 01/24/2012 9:44 AM   Case Manager:  Richelle Ito, LCSW 01/24/2012 9:44 AM   Counselor:  Ronda Fairly, LCSWA 01/24/2012 9:44 AM   Other:     Other:     Other:     Other:      Scribe for Treatment Team:   Ida Rogue, 01/24/2012 9:44 AM

## 2012-01-24 NOTE — Progress Notes (Signed)
Patient"s mood and affect somewhat irritable this evening. Pt stated "the doctor said I would be going home tomorrow and also that she would order my Depakote level for me, if she doesn't let me go tomorrow, I will show her my bott". Patient stated he's feeling better and he is ready to go home. He denied SI/HI, denied hallucinations and denied withdrawal symptoms.

## 2012-01-24 NOTE — Progress Notes (Signed)
[facility] Group Notes:  (Counselor/Nursing/MHT/Case Management/Adjunct)  01/24/2012 5:14 PM  Type of Therapy:  Group Therapy at 1:15 PM  Participation Level:  Active  Participation Quality:  Attentive and Sharing  Affect:  Anxious and Depressed  Cognitive:  Alert and Oriented  Insight:  Good  Engagement in Group:  Good  Engagement in Therapy:  Limited  Modes of Intervention:  Clarification, Socialization and Support  Summary of Progress/Problems:  Max Ward attended 1:15 PM group on 300 hall today and was active in group session. He shared at several points stating he enjoys being alone that isolation is something positive to him. Patient not willing to consider that isolation can be harmful. He also  participated in activity and chose two photographs to represent life out of,one a picture of a prison fence and another of a broken down bicycle. He did choose to photographs to represent life imbalance which was of a single person sitting on a park bench in reading what looked like a beautiful spring day. "I enjoy being outside and look forward to planting a garden, I enjoyed 3 days camping in [place]."   Clide Dales 01/24/2012, 5:14 PM

## 2012-01-24 NOTE — Progress Notes (Signed)
Patient ID: Max Ward, male   DOB: 11-01-1970, 41 y.o.   MRN: [id number]   Patient pleasant on approach today. Reports wanting to go home today but met with treatment team. Physician wants to change his med and order labwork prior to discharge so it may be late today or in the am. Gives depression and hopelessness "1" today. Currently denies any SI at this time. Staff will monitor and encourage group attendance.

## 2012-01-24 NOTE — Progress Notes (Signed)
Recreation Therapy Group Note  Date: 01/24/2012        Time: 1415       Group Topic/Focus: The focus of this group is on enhancing the patient's understanding of leisure, barriers to leisure, and the importance of engaging in positive leisure activities upon discharge for improved total health.  Participation Level: Active  Participation Quality: Appropriate and Attentive  Affect: Appropriate  Cognitive: Appropriate and Oriented   Additional Comments: Patient able to identify positive leisure activities he can engage in during his free time. Group also discussed adjusting to doing activities sober and using leisure activities as a coping strategy.

## 2012-01-25 LAB — HEPATIC FUNCTION PANEL
AST: 12 U/L (ref 0–37)
Albumin: 3.5 g/dL (ref 3.5–5.2)
Total Bilirubin: 0.3 mg/dL (ref 0.3–1.2)

## 2012-01-25 MED ORDER — QUETIAPINE FUMARATE 300 MG PO TABS: ORAL_TABLET | ORAL | Status: DC

## 2012-01-25 MED ORDER — ESOMEPRAZOLE MAGNESIUM 40 MG PO CPDR: 40.0000 mg | DELAYED_RELEASE_CAPSULE | Freq: Two times a day (BID) | ORAL | Status: DC

## 2012-01-25 MED ORDER — TRAZODONE HCL 50 MG PO TABS: 50.0000 mg | ORAL_TABLET | Freq: Every evening | ORAL | Status: DC | PRN

## 2012-01-25 MED ORDER — QUETIAPINE FUMARATE 25 MG PO TABS: 25.0000 mg | ORAL_TABLET | Freq: Three times a day (TID) | ORAL | Status: DC

## 2012-01-25 MED ORDER — DIVALPROEX SODIUM ER 500 MG PO TB24: 1500.0000 mg | ORAL_TABLET | Freq: Every day | ORAL | Status: DC

## 2012-01-25 MED ORDER — MOMETASONE FUROATE 50 MCG/ACT NA SUSP: 1.0000 | Freq: Every day | NASAL | Status: DC

## 2012-01-25 NOTE — Progress Notes (Signed)
Patient ID: Max Ward, male   DOB: [birth year], 41 y.o.   MRN: [id number]   Patient wanting discharge. Treatment team met and felt patient ready for discharge. Obtained all belongings, meds, prescriptions, and f/u appointment. Patient left in own vehicle.

## 2012-01-25 NOTE — Progress Notes (Signed)
[facility] Case Management Discharge Plan:  Will you be returning to the same living situation after discharge: Yes,  home with brother At discharge, do you have transportation home?:Yes,  patient will drive himself home Do you have the ability to pay for your medications:Yes,  pt has access to meds  Interagency Information:     Release of information consent forms completed and in the chart;  Patient's signature needed at discharge.  Patient to Follow up at:  Follow-up Information    Follow up with [facility] on 01/28/2012. (10:15AM with Dr. Betti Cruz. Can schedule appointment with [facility] on Monday  when you go for your psychiatric appointment)    Contact information:   [address]  [place] [phone]      Follow up with [facility]. (Call them to set up a start time and date)    Contact information:   [address]  [address]  [place]  [336] [phone]         Patient denies SI/HI:   Yes,  pt verbally denies SI/HI     Safety Planning and Suicide Prevention discussed:  Yes,  discussed with pt  Barrier to discharge identified:No.  Summary and Recommendations:Pt will f/u with Dr. Betti Cruz on 4/15. Pt is also open to returning to Bipolar support groups and to going to divorce support groups.   Gustavus Haskin 01/25/2012, 1:37 PM

## 2012-01-25 NOTE — Progress Notes (Signed)
Cosigned by Dalana Pfahler C Lynnita Somma, LCSWA 4/12/20134:04 PM   

## 2012-01-25 NOTE — Progress Notes (Signed)
[facility] Group Notes:  (Counselor/Nursing/MHT/Case Management/Adjunct)  01/25/2012 2:49 PM  Type of Therapy:  1:15PM Group Therapy  Participation Level:  Did Not Attend  Wilmon Arms 01/25/2012, 2:49 PM

## 2012-01-25 NOTE — Progress Notes (Signed)
[facility] Group Notes:  (Counselor/Nursing/MHT/Case Management/Adjunct)  01/25/2012 12:56 PM  Type of Therapy:  Group Therapy  Participation Level:  Did Not Attend  Max Ward 01/25/2012, 12:56 PM

## 2012-01-25 NOTE — Progress Notes (Signed)
Cosigned by Carney Bern, LCSWA 4/12/20134:04 PM

## 2012-01-26 NOTE — ED Provider Notes (Signed)
Medical screening examination/treatment/procedure(s) were performed by non-physician practitioner and as supervising physician I was immediately available for consultation/collaboration.    Nelia Shi, MD 01/26/12 1134

## 2012-01-29 NOTE — Progress Notes (Signed)
Patient Discharge Instructions:  Psychiatric Admission Assessment Note Provided,  01/29/2012 After Visit Summary (AVS) Provided,  01/29/2012 Face Sheet Provided, 01/29/2012 Faxed/Sent to the Next Level Care provider:  01/29/2012 Provided Suicide Risk Assessment - Discharge Assessment 01/29/2012  Faxed to [facility] - Dr. Betti Cruz @ [phone]  Wandra Scot, 01/29/2012, 4:39 PM

## 2012-02-06 NOTE — Discharge Summary (Signed)
Physician Discharge Summary Note  Patient:  Max Ward is an 41 y.o., male MRN:  [id number] DOB:  06-18-1971 Patient phone:  [phone] (home)  Patient address:   [address] [place] [address],   Date of Admission:  01/18/2012 Date of Discharge: 01/25/2012  Axis Diagnosis:   AXIS I:  Schizoaffective Disorder, Bipolar Type AXIS II:  No diagnosis AXIS III:  No diagnosis Past Medical History  Diagnosis Date  . Heart disease   . Anxiety   . Coronary artery disease   . Bipolar 1 disorder   . Tachycardia   . Mental disorder   . Depression    AXIS IV:  Marital crisis with separation AXIS V:  61-70 mild symptoms  Level of Care:  OP  Hospital Course:    Max Ward is a 41 year old male who presents with suicidal thoughts after a marital crisis, he and his wife had agreed to separate. He denied any homicidal thoughts. He was asking for help getting stabilized after the crisis. He was feeling very depressed, having fleeting suicidal thoughts without a clear plan not sleeping, feeling mildly internally agitated, with poor appetite. Also felt very anhedonic and had difficulty concentrating and focusing.   He was admitted to our detox unit initially in his home dose of alprazolam was discontinued and he was treated with Librium 25 mg as needed. He experienced no acute withdrawal symptoms. He attended group therapy and her mood disorders program to address his depressive symptoms and is homesick. He was restarted in his home dose of Seroquel 300 mg each bedtime and daytime Seroquel dose was added for mood stabilization. He was also restarted on his Depakote which he had not been taking regularly.  By April 12, he felt more stable, mood improved, and ready for outpatient treatment.  Consults:  None  Significant Diagnostic Studies:  Admitting valproic acid level less than 10, valproic acid level at discharge 61.1 on current dose. Liver enzymes normal. TSH 1.735, free T4 1.26.  Alcohol screen negative urine drug screen negative.  Discharge Vitals:   Blood pressure 120/72, pulse 82, temperature 97.4 F (36.3 C), temperature source Oral, resp. rate 18, height 5' 7.5" (1.715 m), weight 90.719 kg (200 lb), SpO2 96.00%.  Mental Status Exam: See Mental Status Examination and Suicide Risk Assessment completed by Attending Physician prior to discharge.  Discharge destination:  Home  Is patient on multiple antipsychotic therapies at discharge:  No   Has Patient had three or more failed trials of antipsychotic monotherapy by history:  No  Recommended Plan for Multiple Antipsychotic Therapies: n/a   Medication List  As of 02/06/2012  8:09 AM   STOP taking these medications         acetaminophen 325 MG tablet      ALPRAZolam 1 MG tablet      oxyCODONE-acetaminophen 7.5-325 MG per tablet      ROZEREM 8 MG tablet         TAKE these medications      Indication    divalproex 500 MG 24 hr tablet   Commonly known as: DEPAKOTE ER   Take 3 tablets (1,500 mg total) by mouth at bedtime. For mood stability       esomeprazole 40 MG capsule   Commonly known as: NEXIUM   Take 1 capsule (40 mg total) by mouth 2 (two) times daily. For acid reflux.       mometasone 50 MCG/ACT nasal spray   Commonly known as: NASONEX  Place 1-2 sprays into the nose daily. For allergies       QUEtiapine 300 MG tablet   Commonly known as: SEROQUEL   Take two tablets, (600mg  total) daily at bedtime. For psychosis.       QUEtiapine 25 MG tablet   Commonly known as: SEROQUEL   Take 1 tablet (25 mg total) by mouth 3 (three) times daily. For psychosis and mood stability.       traZODone 50 MG tablet   Commonly known as: DESYREL   Take 1 tablet (50 mg total) by mouth at bedtime as needed for sleep. For sleep            Follow-up Information    Follow up with [facility] on 01/28/2012. (10:15AM with Dr. Betti Cruz. Can schedule appointment with [facility] on Monday  when you go for your  psychiatric appointment)    Contact information:   [address]  [place] [phone]      Follow up with [facility]. (Call them to set up a start time and date)    Contact information:   [address]  [address]  [place]  [336] [phone]         Follow-up recommendations:  Activity:  unrestricted Diet:  regular  Signed: Areil Ottey A 02/06/2012, 8:09 AM

## 2014-09-27 ENCOUNTER — Encounter (HOSPITAL_COMMUNITY): Payer: Self-pay

## 2014-09-27 ENCOUNTER — Emergency Department (HOSPITAL_COMMUNITY): Payer: Medicare Other

## 2014-09-27 ENCOUNTER — Emergency Department (HOSPITAL_COMMUNITY)
Admission: EM | Admit: 2014-09-27 | Discharge: 2014-09-27 | Disposition: A | Discharge status: Home / Self Care | Payer: Medicare Other | Attending: Emergency Medicine | Admitting: Emergency Medicine

## 2014-09-27 DIAGNOSIS — I10 Essential (primary) hypertension: Secondary | ICD-10-CM | POA: Insufficient documentation

## 2014-09-27 DIAGNOSIS — R1084 Generalized abdominal pain: Secondary | ICD-10-CM

## 2014-09-27 DIAGNOSIS — R079 Chest pain, unspecified: Secondary | ICD-10-CM | POA: Insufficient documentation

## 2014-09-27 DIAGNOSIS — Z79899 Other long term (current) drug therapy: Secondary | ICD-10-CM | POA: Insufficient documentation

## 2014-09-27 DIAGNOSIS — Z7951 Long term (current) use of inhaled steroids: Secondary | ICD-10-CM | POA: Insufficient documentation

## 2014-09-27 DIAGNOSIS — F319 Bipolar disorder, unspecified: Secondary | ICD-10-CM | POA: Insufficient documentation

## 2014-09-27 DIAGNOSIS — R531 Weakness: Secondary | ICD-10-CM | POA: Diagnosis not present

## 2014-09-27 DIAGNOSIS — Z7901 Long term (current) use of anticoagulants: Secondary | ICD-10-CM | POA: Insufficient documentation

## 2014-09-27 DIAGNOSIS — R0602 Shortness of breath: Secondary | ICD-10-CM | POA: Insufficient documentation

## 2014-09-27 DIAGNOSIS — G40909 Epilepsy, unspecified, not intractable, without status epilepticus: Secondary | ICD-10-CM | POA: Diagnosis not present

## 2014-09-27 DIAGNOSIS — Z720 Tobacco use: Secondary | ICD-10-CM | POA: Diagnosis not present

## 2014-09-27 DIAGNOSIS — I2510 Atherosclerotic heart disease of native coronary artery without angina pectoris: Secondary | ICD-10-CM | POA: Insufficient documentation

## 2014-09-27 DIAGNOSIS — F419 Anxiety disorder, unspecified: Secondary | ICD-10-CM | POA: Insufficient documentation

## 2014-09-27 DIAGNOSIS — Z954 Presence of other heart-valve replacement: Secondary | ICD-10-CM | POA: Diagnosis not present

## 2014-09-27 HISTORY — DX: Essential (primary) hypertension: I10

## 2014-09-27 LAB — CBC WITH DIFFERENTIAL/PLATELET
BASOS ABS: 0 10*3/uL (ref 0.0–0.1)
BASOS PCT: 0 % (ref 0–1)
Eosinophils Absolute: 0.2 10*3/uL (ref 0.0–0.7)
Eosinophils Relative: 2 % (ref 0–5)
HEMATOCRIT: 45.6 % (ref 39.0–52.0)
Hemoglobin: 15.6 g/dL (ref 13.0–17.0)
LYMPHS PCT: 16 % (ref 12–46)
Lymphs Abs: 1.7 10*3/uL (ref 0.7–4.0)
MCH: 31 pg (ref 26.0–34.0)
MCHC: 34.2 g/dL (ref 30.0–36.0)
MCV: 90.5 fL (ref 78.0–100.0)
MONO ABS: 0.7 10*3/uL (ref 0.1–1.0)
Monocytes Relative: 7 % (ref 3–12)
NEUTROS ABS: 8.1 10*3/uL — AB (ref 1.7–7.7)
NEUTROS PCT: 75 % (ref 43–77)
Platelets: 238 10*3/uL (ref 150–400)
RBC: 5.04 MIL/uL (ref 4.22–5.81)
RDW: 12.8 % (ref 11.5–15.5)
WBC: 10.8 10*3/uL — AB (ref 4.0–10.5)

## 2014-09-27 LAB — BASIC METABOLIC PANEL
ANION GAP: 13 (ref 5–15)
BUN: 18 mg/dL (ref 6–23)
CHLORIDE: 102 meq/L (ref 96–112)
CO2: 25 meq/L (ref 19–32)
CREATININE: 0.97 mg/dL (ref 0.50–1.35)
Calcium: 9.5 mg/dL (ref 8.4–10.5)
GFR calc non Af Amer: >90 mL/min (ref >90)
Glucose, Bld: 100 mg/dL — ABNORMAL HIGH (ref 70–99)
POTASSIUM: 4.3 meq/L (ref 3.7–5.3)
SODIUM: 140 meq/L (ref 137–147)

## 2014-09-27 LAB — VALPROIC ACID LEVEL

## 2014-09-27 LAB — PROTIME-INR
INR: 1.99 — ABNORMAL HIGH (ref 0.00–1.49)
PROTHROMBIN TIME: 22.8 s — AB (ref 11.6–15.2)

## 2014-09-27 LAB — I-STAT TROPONIN, ED: TROPONIN I, POC: 0 ng/mL (ref 0.00–0.08)

## 2014-09-27 MED ORDER — IOHEXOL 350 MG/ML SOLN
100.0000 mL | Freq: Once | INTRAVENOUS | Status: AC | PRN
Start: 2014-09-27 — End: 2014-09-27
  Administered 2014-09-27: 100 mL via INTRAVENOUS

## 2014-09-27 MED ORDER — WARFARIN SODIUM 5 MG PO TABS: 5.0000 mg | ORAL_TABLET | Freq: Every day | ORAL | Status: DC

## 2014-09-27 NOTE — Discharge Instructions (Signed)
Abdominal Pain °Many things can cause abdominal pain. Usually, abdominal pain is not caused by a disease and will improve without treatment. It can often be observed and treated at home. Your health care provider will do a physical exam and possibly order blood tests and X-rays to help determine the seriousness of your pain. However, in many cases, more time must pass before a clear cause of the pain can be found. Before that point, your health care provider may not know if you need more testing or further treatment. °HOME CARE INSTRUCTIONS  °Monitor your abdominal pain for any changes. The following actions may help to alleviate any discomfort you are experiencing: °· Only take over-the-counter or prescription medicines as directed by your health care provider. °· Do not take laxatives unless directed to do so by your health care provider. °· Try a clear liquid diet (broth, tea, or water) as directed by your health care provider. Slowly move to a bland diet as tolerated. °SEEK MEDICAL CARE IF: °· You have unexplained abdominal pain. °· You have abdominal pain associated with nausea or diarrhea. °· You have pain when you urinate or have a bowel movement. °· You experience abdominal pain that wakes you in the night. °· You have abdominal pain that is worsened or improved by eating food. °· You have abdominal pain that is worsened with eating fatty foods. °· You have a fever. °SEEK IMMEDIATE MEDICAL CARE IF:  °· Your pain does not go away within 2 hours. °· You keep throwing up (vomiting). °· Your pain is felt only in portions of the abdomen, such as the right side or the left lower portion of the abdomen. °· You pass bloody or black tarry stools. °MAKE SURE YOU: °· Understand these instructions.   °· Will watch your condition.   °· Will get help right away if you are not doing well or get worse.   °Document Released: 07/11/2005 Document Revised: 10/06/2013 Document Reviewed: 06/10/2013 °ExitCare® Patient Information  ©2015 [employer]. This information is not intended to replace advice given to you by your health care provider. Make sure you discuss any questions you have with your health care provider. ° °Chest Pain (Nonspecific) °It is often hard to give a specific diagnosis for the cause of chest pain. There is always a chance that your pain could be related to something serious, such as a heart attack or a blood clot in the lungs. You need to follow up with your health care provider for further evaluation. °CAUSES  °· Heartburn. °· Pneumonia or bronchitis. °· Anxiety or stress. °· Inflammation around your heart (pericarditis) or lung (pleuritis or pleurisy). °· A blood clot in the lung. °· A collapsed lung (pneumothorax). It can develop suddenly on its own (spontaneous pneumothorax) or from trauma to the chest. °· Shingles infection (herpes zoster virus). °The chest wall is composed of bones, muscles, and cartilage. Any of these can be the source of the pain. °· The bones can be bruised by injury. °· The muscles or cartilage can be strained by coughing or overwork. °· The cartilage can be affected by inflammation and become sore (costochondritis). °DIAGNOSIS  °Lab tests or other studies may be needed to find the cause of your pain. Your health care provider may have you take a test called an ambulatory electrocardiogram (ECG). An ECG records your heartbeat patterns over a 24-hour period. You may also have other tests, such as: °· Transthoracic echocardiogram (TTE). During echocardiography, sound waves are used to evaluate how blood   flows through your heart. °· Transesophageal echocardiogram (TEE). °· Cardiac monitoring. This allows your health care provider to monitor your heart rate and rhythm in real time. °· Holter monitor. This is a portable device that records your heartbeat and can help diagnose heart arrhythmias. It allows your health care provider to track your heart activity for several days, if needed. °· Stress  tests by exercise or by giving medicine that makes the heart beat faster. °TREATMENT  °· Treatment depends on what may be causing your chest pain. Treatment may include: °¨ Acid blockers for heartburn. °¨ Anti-inflammatory medicine. °¨ Pain medicine for inflammatory conditions. °¨ Antibiotics if an infection is present. °· You may be advised to change lifestyle habits. This includes stopping smoking and avoiding alcohol, caffeine, and chocolate. °· You may be advised to keep your head raised (elevated) when sleeping. This reduces the chance of acid going backward from your stomach into your esophagus. °Most of the time, nonspecific chest pain will improve within 2-3 days with rest and mild pain medicine.  °HOME CARE INSTRUCTIONS  °· If antibiotics were prescribed, take them as directed. Finish them even if you start to feel better. °· For the next few days, avoid physical activities that bring on chest pain. Continue physical activities as directed. °· Do not use any tobacco products, including cigarettes, chewing tobacco, or electronic cigarettes. °· Avoid drinking alcohol. °· Only take medicine as directed by your health care provider. °· Follow your health care provider's suggestions for further testing if your chest pain does not go away. °· Keep any follow-up appointments you made. If you do not go to an appointment, you could develop lasting (chronic) problems with pain. If there is any problem keeping an appointment, call to reschedule. °SEEK MEDICAL CARE IF:  °· Your chest pain does not go away, even after treatment. °· You have a rash with blisters on your chest. °· You have a fever. °SEEK IMMEDIATE MEDICAL CARE IF:  °· You have increased chest pain or pain that spreads to your arm, neck, jaw, back, or abdomen. °· You have shortness of breath. °· You have an increasing cough, or you cough up blood. °· You have severe back or abdominal pain. °· You feel nauseous or vomit. °· You have severe weakness. °· You  faint. °· You have chills. °This is an emergency. Do not wait to see if the pain will go away. Get medical help at once. Call your local emergency services (911 in [place] Do not drive yourself to the hospital. °MAKE SURE YOU:  °· Understand these instructions. °· Will watch your condition. °· Will get help right away if you are not doing well or get worse. °Document Released: 07/11/2005 Document Revised: 10/06/2013 Document Reviewed: 05/06/2008 °ExitCare® Patient Information ©2015 [employer]. This information is not intended to replace advice given to you by your health care provider. Make sure you discuss any questions you have with your health care provider. ° °

## 2014-09-27 NOTE — ED Notes (Signed)
Per EMS, patient c/o weakness and dizziness x1 week and CP x2-3 days. States CP in central, no radiation, 5/10. Hx of mechanical aortic valve placement and aortic and ventricle dilation. Pt. On warfarin, states he has been trying to check levels and states "my blood is too thick".
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
## 2015-05-30 DIAGNOSIS — I10 Essential (primary) hypertension: Secondary | ICD-10-CM | POA: Insufficient documentation

## 2015-05-30 DIAGNOSIS — Z7901 Long term (current) use of anticoagulants: Secondary | ICD-10-CM | POA: Insufficient documentation

## 2015-05-30 DIAGNOSIS — R55 Syncope and collapse: Secondary | ICD-10-CM

## 2015-05-30 DIAGNOSIS — R42 Dizziness and giddiness: Secondary | ICD-10-CM | POA: Insufficient documentation

## 2015-10-18 DIAGNOSIS — G4700 Insomnia, unspecified: Secondary | ICD-10-CM | POA: Insufficient documentation

## 2015-10-18 DIAGNOSIS — G894 Chronic pain syndrome: Secondary | ICD-10-CM | POA: Insufficient documentation

## 2015-10-18 DIAGNOSIS — J45909 Unspecified asthma, uncomplicated: Secondary | ICD-10-CM | POA: Insufficient documentation

## 2015-10-18 DIAGNOSIS — I471 Supraventricular tachycardia, unspecified: Secondary | ICD-10-CM | POA: Insufficient documentation

## 2015-10-18 DIAGNOSIS — J309 Allergic rhinitis, unspecified: Secondary | ICD-10-CM | POA: Insufficient documentation

## 2015-10-18 DIAGNOSIS — F909 Attention-deficit hyperactivity disorder, unspecified type: Secondary | ICD-10-CM | POA: Insufficient documentation

## 2015-10-18 DIAGNOSIS — M5126 Other intervertebral disc displacement, lumbar region: Secondary | ICD-10-CM | POA: Insufficient documentation

## 2015-10-18 DIAGNOSIS — K219 Gastro-esophageal reflux disease without esophagitis: Secondary | ICD-10-CM | POA: Insufficient documentation

## 2015-10-18 DIAGNOSIS — D649 Anemia, unspecified: Secondary | ICD-10-CM | POA: Insufficient documentation

## 2015-11-03 ENCOUNTER — Encounter (HOSPITAL_COMMUNITY): Payer: Self-pay | Admitting: Family Medicine

## 2015-11-03 ENCOUNTER — Emergency Department (HOSPITAL_COMMUNITY)
Admission: EM | Admit: 2015-11-03 | Discharge: 2015-11-03 | Disposition: A | Discharge status: Home / Self Care | Payer: Medicare Other | Attending: Emergency Medicine | Admitting: Emergency Medicine

## 2015-11-03 DIAGNOSIS — K0381 Cracked tooth: Secondary | ICD-10-CM | POA: Diagnosis not present

## 2015-11-03 DIAGNOSIS — K002 Abnormalities of size and form of teeth: Secondary | ICD-10-CM | POA: Diagnosis not present

## 2015-11-03 DIAGNOSIS — Z7951 Long term (current) use of inhaled steroids: Secondary | ICD-10-CM | POA: Insufficient documentation

## 2015-11-03 DIAGNOSIS — Z9889 Other specified postprocedural states: Secondary | ICD-10-CM | POA: Insufficient documentation

## 2015-11-03 DIAGNOSIS — Z7901 Long term (current) use of anticoagulants: Secondary | ICD-10-CM | POA: Diagnosis not present

## 2015-11-03 DIAGNOSIS — J45909 Unspecified asthma, uncomplicated: Secondary | ICD-10-CM | POA: Insufficient documentation

## 2015-11-03 DIAGNOSIS — F419 Anxiety disorder, unspecified: Secondary | ICD-10-CM | POA: Insufficient documentation

## 2015-11-03 DIAGNOSIS — K0889 Other specified disorders of teeth and supporting structures: Secondary | ICD-10-CM

## 2015-11-03 DIAGNOSIS — I2510 Atherosclerotic heart disease of native coronary artery without angina pectoris: Secondary | ICD-10-CM | POA: Insufficient documentation

## 2015-11-03 DIAGNOSIS — F319 Bipolar disorder, unspecified: Secondary | ICD-10-CM | POA: Insufficient documentation

## 2015-11-03 DIAGNOSIS — I10 Essential (primary) hypertension: Secondary | ICD-10-CM | POA: Insufficient documentation

## 2015-11-03 DIAGNOSIS — Z79899 Other long term (current) drug therapy: Secondary | ICD-10-CM | POA: Insufficient documentation

## 2015-11-03 DIAGNOSIS — F17210 Nicotine dependence, cigarettes, uncomplicated: Secondary | ICD-10-CM | POA: Insufficient documentation

## 2015-11-03 HISTORY — DX: Unspecified asthma, uncomplicated: J45.909

## 2015-11-03 MED ORDER — PENICILLIN V POTASSIUM 500 MG PO TABS: 500.0000 mg | ORAL_TABLET | Freq: Four times a day (QID) | ORAL | Status: AC

## 2015-11-03 NOTE — ED Notes (Signed)
Pt stable, ambulatory, states understanding of discharge instructions 

## 2015-11-03 NOTE — ED Notes (Signed)
Pt here for broken tooth on the bottom left side. sts no dentist will see him because he is on blood thinner.

## 2015-11-03 NOTE — Discharge Instructions (Signed)
Dental Care: Organization         Address  Phone  Notes  [facility] Department of [facility] [place] [phone] Accepts children up to age 45 who are enrolled in [place] or [place]; pregnant women with a Medicaid card; and children who have applied for Medicaid or [place] Health Choice, but were declined, whose parents can pay a reduced fee at time of service.  [facility] Department of [facility]  [address], [place] [phone] Accepts children up to age 2 who are enrolled in [place] or [place]; pregnant women with a Medicaid card; and children who have applied for Medicaid or [place] Health Choice, but were declined, whose parents can pay a reduced fee at time of service.  [facility]  [place] [phone] Patients are seen by appointment only. Walk-ins are not accepted. [place] will see patients 55 years of age and older. Monday - Tuesday (8am-5pm) Most Wednesdays (8:30-5pm) $30 per visit, cash only  [facility]  [address], [place] [phone] Patients are seen by appointment only. Walk-ins are not accepted. [place] will see patients 34 years of age and older. One Wednesday Evening (Monthly: Volunteer Based).  $30 per visit, cash only  [place]  [phone] for adults; Children under age 53, call Graduate Pediatric Dentistry at [phone]. Children aged 90-14, please call [phone] to request a pediatric application.  Dental services are provided in all areas of dental care including fillings, crowns and bridges, complete and partial dentures, implants, gum treatment, root canals, and extractions. Preventive care is also provided. Treatment is provided to both adults and children. Patients are selected via a lottery and there is often a waiting list.    [facility] [address], [place]  [phone] [email or web address]   [facility] [address] [place], [place] [phone], Ext. [phone] Second and Fourth Thursday of each month, opens at 6:30 AM; Clinic ends at 9 AM.  Patients are seen on a first-come first-served basis, and a limited number are seen during each clinic.   [facility]  [address] [place]er [place], [place] [phone]   Eligibility Requirements You must have lived in [place], [place], or [place] counties for at least the last three months.   You cannot be eligible for state or federal sponsored [employer], including [employer], [place], or [employer].   You generally cannot be eligible for healthcare insurance through your employer.    How to apply: Eligibility screenings are held every Tuesday and Wednesday afternoon from 1:00 pm until 4:00 pm. You do not need an appointment for the interview!  [facility] [address] [place], [place]   [place]  [place] Department  [place] Department  [phone]    Dental Pain    Dental pain may be caused by many things, including:  Tooth decay (cavities or caries). Cavities expose the nerve of your tooth to air and hot or cold temperatures. This can cause pain or discomfort.  Abscess or infection. A dental abscess is a collection of infected pus from a bacterial infection in the inner part of the tooth (pulp). It usually occurs at the end of the tooth's  root.  Injury.  An unknown reason (idiopathic). Your pain may be mild or severe. It may only occur when:  You are chewing.  You are exposed to hot or cold temperature.  You are eating or drinking sugary foods or beverages, such as soda or candy. Your pain may also be constant.  HOME CARE INSTRUCTIONS  Watch your dental pain for any changes.  The following actions may help to lessen any discomfort that you are feeling:  Take medicines only as directed by your dentist.  If you were prescribed an antibiotic medicine, finish all of it even if you start to feel better.  Keep all follow-up visits as directed by your dentist. This is important.  Do not apply heat to the outside of your face.  Rinse your mouth or gargle with salt water if directed by your dentist. This helps with pain and swelling.  You can make salt water by adding  tsp of salt to 1 cup of warm water. Apply ice to the painful area of your face:  Put ice in a plastic bag.  Place a towel between your skin and the bag.  Leave the ice on for 20 minutes, 2-3 times per day. Avoid foods or drinks that cause you pain, such as:  Very hot or very cold foods or drinks.  Sweet or sugary foods or drinks. SEEK MEDICAL CARE IF:  Your pain is not controlled with medicines.  Your symptoms are worse.  You have new symptoms. SEEK IMMEDIATE MEDICAL CARE IF:  You are unable to open your mouth.  You are having trouble breathing or swallowing.  You have a fever.  Your face, neck, or jaw is swollen. This information is not intended to replace advice given to you by your health care provider. Make sure you discuss any questions you have with your health care provider.  Document Released: 10/01/2005 Document Revised: 02/15/2015 Document Reviewed: 09/27/2014  Elsevier Interactive Patient Education [employer].

## 2015-11-03 NOTE — ED Provider Notes (Signed)
History  By signing my name below, I, Marlowe Kays, attest that this documentation has been prepared under the direction and in the presence of Kierria Feigenbaum, PA-C. Electronically Signed: Marlowe Kays, ED Scribe. 11/03/2015. 8:07 PM.  Chief Complaint  Patient presents with  . Dental Pain   The history is provided by the patient and medical records. No language interpreter was used.    HPI Comments:  Max Ward is a 45 y.o. male who presents to the Emergency Department complaining of severe lower left sided dental pain secondary to having a fractured tooth that occurred about one month ago. He states the pain began worsening in the past few days. He states he is on anticoagulant therapy so he has not been able to have the tooth surgically removed but has not seen a dentist yet. He states "I figured you guys could do emergency dental surgery like this". Denies difficulty swallowing or breathing. He has not taken anything for pain. Eating increases the pain. He denies alleviating factors. He denies fever, chills, nausea, vomiting. He reports being in pain management and cannot receive a prescription for narcotics.  Past Medical History  Diagnosis Date  . Heart disease   . Anxiety   . Coronary artery disease   . Bipolar 1 disorder ([place])   . Tachycardia   . Mental disorder   . Depression   . Hypertension   . Seizures ([place])   . Asthma    Past Surgical History  Procedure Laterality Date  . Cardiac catheterization    . Mechanical aortic valve replacement    . Bone tumor excision      skull   History reviewed. No pertinent family history. Social History  Substance Use Topics  . Smoking status: Current Every Day Smoker -- 1.00 packs/day for 15 years    Types: Cigarettes  . Smokeless tobacco: None  . Alcohol Use: Yes    Review of Systems  HENT: Positive for dental problem.   All other systems reviewed and are negative.   Allergies  Molds & smuts and  Valium  Home Medications   Prior to Admission medications   Medication Sig Start Date End Date Taking? Authorizing Provider  divalproex (DEPAKOTE ER) 500 MG 24 hr tablet Take 3 tablets (1,500 mg total) by mouth at bedtime. For mood stability 01/25/12 01/24/13  Greig Castilla, FNP  esomeprazole (NEXIUM) 40 MG capsule Take 1 capsule (40 mg total) by mouth 2 (two) times daily. For acid reflux. 01/25/12   Greig Castilla, FNP  mometasone (NASONEX) 50 MCG/ACT nasal spray Place 1-2 sprays into the nose daily. For allergies 01/25/12   Greig Castilla, FNP  penicillin v potassium (VEETID) 500 MG tablet Take 1 tablet (500 mg total) by mouth 4 (four) times daily. 11/03/15 11/10/15  Reggie Bise, PA-C  QUEtiapine (SEROQUEL) 25 MG tablet Take 1 tablet (25 mg total) by mouth 3 (three) times daily. For psychosis and mood stability. 01/25/12 02/24/12  Greig Castilla, FNP  QUEtiapine (SEROQUEL) 300 MG tablet Take two tablets, (600mg  total) daily at bedtime. For psychosis. 01/25/12   Greig Castilla, FNP  traZODone (DESYREL) 50 MG tablet Take 1 tablet (50 mg total) by mouth at bedtime as needed for sleep. For sleep 01/25/12 02/24/12  Greig Castilla, FNP  warfarin (COUMADIN) 5 MG tablet Take 1 tablet (5 mg total) by mouth daily. 09/27/14   Kristen N Ward, DO   Triage Vitals: BP 140/108 mmHg  Pulse 63  Temp(Src) 98.1  F (36.7 C) (Oral)  Resp 18  SpO2 98% Physical Exam  Constitutional: He appears well-developed and well-nourished. No distress.  HENT:  Head: Normocephalic and atraumatic.  Right Ear: External ear normal.  Left Ear: External ear normal.  Mouth/Throat: Uvula is midline, oropharynx is clear and moist and mucous membranes are normal. Mucous membranes are not dry. No trismus in the jaw. Abnormal dentition. No dental abscesses or uvula swelling.    Fractured tooth noted in left lower jawline. No erythema of the surrounding gumline. No obvious dental abscess. No trismus. No erythema, induration or  tenderness of sublingual area.   Eyes: Conjunctivae are normal. Right eye exhibits no discharge. Left eye exhibits no discharge. No scleral icterus.  Neck: Normal range of motion. Neck supple.  FROM of the neck without pain. No soft tissue swelling of the neck.  Cardiovascular: Normal rate.   Pulmonary/Chest: Effort normal.  Musculoskeletal: Normal range of motion.  Moves all extremities spontaneously  Lymphadenopathy:    He has no cervical adenopathy.  Neurological: He is alert. Coordination normal.  Skin: Skin is warm and dry.  Psychiatric: He has a normal mood and affect. His behavior is normal.  Nursing note and vitals reviewed.   ED Course  Procedures (including critical care time) DIAGNOSTIC STUDIES: Oxygen Saturation is 98% on RA, normal by my interpretation.   COORDINATION OF CARE: 8:06 PM- Will give dental referral and prescribe antibiotics. Pt verbalizes understanding and agrees to plan.  Medications - No data to display   MDM   Final diagnoses:  Dentalgia   Patient presenting with tooth pain. No obvious abscess on exam. No soft tissue swelling of the cheek or neck. Exam unconcerning for Ludwig's angina or spread of infection. Will discharge with penicillin and encouraged pt to follow-up with dentist. Resource guide given in discharge paperwork. Return precautions discussed with pt and given in discharge paperwork. Stable for discharge.   I personally performed the services described in this documentation, which was scribed in my presence. The recorded information has been reviewed and is accurate.     Lahoma Crocker Halen [place]ger, PA-C 11/03/15 2032  Jola Schmidt, MD 11/04/15 [phone]

## 2015-11-25 DIAGNOSIS — Z952 Presence of prosthetic heart valve: Secondary | ICD-10-CM | POA: Insufficient documentation

## 2016-02-15 DIAGNOSIS — M542 Cervicalgia: Secondary | ICD-10-CM | POA: Insufficient documentation

## 2016-02-15 DIAGNOSIS — G43009 Migraine without aura, not intractable, without status migrainosus: Secondary | ICD-10-CM | POA: Insufficient documentation

## 2016-02-15 DIAGNOSIS — M545 Low back pain, unspecified: Secondary | ICD-10-CM | POA: Insufficient documentation

## 2016-02-15 DIAGNOSIS — Z87828 Personal history of other (healed) physical injury and trauma: Secondary | ICD-10-CM | POA: Insufficient documentation

## 2016-02-15 DIAGNOSIS — Z79899 Other long term (current) drug therapy: Secondary | ICD-10-CM | POA: Insufficient documentation

## 2016-02-16 DIAGNOSIS — Z5181 Encounter for therapeutic drug level monitoring: Secondary | ICD-10-CM | POA: Insufficient documentation

## 2016-02-21 DIAGNOSIS — J301 Allergic rhinitis due to pollen: Secondary | ICD-10-CM | POA: Insufficient documentation

## 2016-02-21 DIAGNOSIS — E782 Mixed hyperlipidemia: Secondary | ICD-10-CM | POA: Insufficient documentation

## 2016-07-11 DIAGNOSIS — R791 Abnormal coagulation profile: Secondary | ICD-10-CM | POA: Insufficient documentation

## 2016-07-11 DIAGNOSIS — H6981 Other specified disorders of Eustachian tube, right ear: Secondary | ICD-10-CM | POA: Insufficient documentation

## 2016-07-11 DIAGNOSIS — H6983 Other specified disorders of Eustachian tube, bilateral: Secondary | ICD-10-CM | POA: Insufficient documentation

## 2016-07-11 DIAGNOSIS — H9201 Otalgia, right ear: Secondary | ICD-10-CM | POA: Insufficient documentation

## 2016-07-11 DIAGNOSIS — Z206 Contact with and (suspected) exposure to human immunodeficiency virus [HIV]: Secondary | ICD-10-CM | POA: Insufficient documentation

## 2016-10-15 HISTORY — PX: CARDIAC CATHETERIZATION: SHX172

## 2017-03-03 ENCOUNTER — Encounter (HOSPITAL_COMMUNITY): Payer: Self-pay | Admitting: Emergency Medicine

## 2017-03-03 ENCOUNTER — Ambulatory Visit (HOSPITAL_COMMUNITY)
Admission: EM | Admit: 2017-03-03 | Discharge: 2017-03-03 | Disposition: A | Discharge status: Home / Self Care | Payer: Medicare Other | Attending: Internal Medicine | Admitting: Internal Medicine

## 2017-03-03 DIAGNOSIS — K611 Rectal abscess: Secondary | ICD-10-CM | POA: Diagnosis not present

## 2017-03-03 MED ORDER — CEPHALEXIN 500 MG PO CAPS: 500.0000 mg | ORAL_CAPSULE | Freq: Four times a day (QID) | ORAL | 0 refills | Status: DC

## 2017-03-03 NOTE — ED Triage Notes (Signed)
The patient presented to the [facility] with a possible abscess on his left buttock x 3 days.

## 2017-03-03 NOTE — ED Provider Notes (Signed)
CSN: [id number]     Arrival date & time 03/03/17  1426 History   First MD Initiated Contact with Patient 03/03/17 1603     Chief Complaint  Patient presents with  . Abscess   (Consider location/radiation/quality/duration/timing/severity/associated sxs/prior Treatment) HPI Patient comes in today with complaints of a perirectal abscess. States that pain and swelling started about 3 days ago. No injury. Early on this was about the size of a "golf ball". Swelling has decreased but this still continues to be very painful. No points of fever chills dysuria hematuria constipation diarrhea abdominal pain nausea vomiting. Has not noticed any drainage. Pain with sitting and walking. Has had abscesses drained on his back before but never in this area. Past Medical History:  Diagnosis Date  . Anxiety   . Asthma   . Bipolar 1 disorder ([place])   . Coronary artery disease   . Depression   . Heart disease   . Hypertension   . Mental disorder   . Seizures ([place])   . Tachycardia    Past Surgical History:  Procedure Laterality Date  . BONE TUMOR EXCISION     skull  . CARDIAC CATHETERIZATION    . MECHANICAL AORTIC VALVE REPLACEMENT     History reviewed. No pertinent family history. Social History  Substance Use Topics  . Smoking status: Current Every Day Smoker    Packs/day: 1.00    Years: 15.00    Types: Cigarettes  . Smokeless tobacco: Not on file  . Alcohol use Yes    Review of Systems  Constitutional: Negative for chills, fatigue and fever.  HENT: Negative.   Respiratory: Negative.   Cardiovascular: Negative.   Gastrointestinal: Negative for abdominal pain, anal bleeding, blood in stool, constipation, diarrhea, nausea, rectal pain and vomiting.  Genitourinary: Negative.   Musculoskeletal: Negative.   Skin:       Skin abscess  Neurological: Negative.   Psychiatric/Behavioral: Negative.     Allergies  Molds & smuts and Valium [diazepam]  Home Medications   Prior to Admission  medications   Medication Sig Start Date End Date Taking? Authorizing Provider  traZODone (DESYREL) 50 MG tablet Take 1 tablet (50 mg total) by mouth at bedtime as needed for sleep. For sleep 01/25/12 03/03/17 Yes Greig Castilla, FNP  warfarin (COUMADIN) 5 MG tablet Take 1 tablet (5 mg total) by mouth daily. 09/27/14  Yes Ward, Delice Bison, DO  cephALEXin (KEFLEX) 500 MG capsule Take 1 capsule (500 mg total) by mouth 4 (four) times daily. 03/03/17   Lanae Crumbly, PA-C   Meds Ordered and Administered this Visit  Medications - No data to display  BP 117/83 (BP Location: Right Arm)   Pulse 80   Temp 98.6 F (37 C) (Oral)   Resp 16   SpO2 97%  No data found.   Physical Exam  Constitutional: He is oriented to person, place, and time. He appears well-developed. No distress.  HENT:  Head: Normocephalic and atraumatic.  Eyes: EOM are normal. Pupils are equal, round, and reactive to light.  Pulmonary/Chest: No respiratory distress.  Abdominal: He exhibits no distension.  Genitourinary: Rectal exam shows tenderness.     Genitourinary Comments: There is some redness.  Neurological: He is alert and oriented to person, place, and time.  Skin: Skin is warm and dry.  Psychiatric: He has a normal mood and affect.    Urgent Care Course     Procedures (including critical care time)  Labs Review Labs Reviewed - No  data to display  Imaging Review No results found.   Visual Acuity Review  Right Eye Distance:   Left Eye Distance:   Bilateral Distance:    Right Eye Near:   Left Eye Near:    Bilateral Near:         MDM   1. Perirectal abscess    I do not think this area needs to be drained today. Patient's conservative measures have been helping. Given a prescription for Keflex 500 mg 1 tab by mouth every 6 hours 10 days. If the area worsens or not getting better over the next 5-7 days he will return to the clinic. All questions answered. Can use over-the-counter Tylenol  and/or an appropriate and when necessary for pain. Patient is under a pain med contract so no narcotics are given today.    Lanae Crumbly, PA-C 03/03/17 [phone]

## 2017-03-03 NOTE — Discharge Instructions (Signed)
Can use over-the-counter Tylenol and/or ibuprofen as needed for pain.  Patient states that he is being followed by pain management so no narcotic pain medication given today.  If not getting better over the next 5-7 days return back to the urgent care or go to the emergency room.

## 2017-03-10 ENCOUNTER — Emergency Department (HOSPITAL_COMMUNITY)
Admission: EM | Admit: 2017-03-10 | Discharge: 2017-03-10 | Disposition: A | Discharge status: Home / Self Care | Payer: Medicare Other | Attending: Emergency Medicine | Admitting: Emergency Medicine

## 2017-03-10 ENCOUNTER — Encounter (HOSPITAL_COMMUNITY): Payer: Self-pay

## 2017-03-10 ENCOUNTER — Emergency Department (HOSPITAL_COMMUNITY): Payer: Medicare Other

## 2017-03-10 DIAGNOSIS — I2510 Atherosclerotic heart disease of native coronary artery without angina pectoris: Secondary | ICD-10-CM | POA: Diagnosis not present

## 2017-03-10 DIAGNOSIS — Y999 Unspecified external cause status: Secondary | ICD-10-CM | POA: Insufficient documentation

## 2017-03-10 DIAGNOSIS — Y929 Unspecified place or not applicable: Secondary | ICD-10-CM | POA: Diagnosis not present

## 2017-03-10 DIAGNOSIS — S80811A Abrasion, right lower leg, initial encounter: Secondary | ICD-10-CM | POA: Diagnosis not present

## 2017-03-10 DIAGNOSIS — Z23 Encounter for immunization: Secondary | ICD-10-CM | POA: Diagnosis not present

## 2017-03-10 DIAGNOSIS — F17210 Nicotine dependence, cigarettes, uncomplicated: Secondary | ICD-10-CM | POA: Insufficient documentation

## 2017-03-10 DIAGNOSIS — Y939 Activity, unspecified: Secondary | ICD-10-CM | POA: Diagnosis not present

## 2017-03-10 DIAGNOSIS — S80812A Abrasion, left lower leg, initial encounter: Secondary | ICD-10-CM | POA: Insufficient documentation

## 2017-03-10 DIAGNOSIS — Z79899 Other long term (current) drug therapy: Secondary | ICD-10-CM | POA: Diagnosis not present

## 2017-03-10 DIAGNOSIS — I10 Essential (primary) hypertension: Secondary | ICD-10-CM | POA: Insufficient documentation

## 2017-03-10 DIAGNOSIS — J45909 Unspecified asthma, uncomplicated: Secondary | ICD-10-CM | POA: Diagnosis not present

## 2017-03-10 LAB — CBC
HEMATOCRIT: 41.1 % (ref 39.0–52.0)
HEMOGLOBIN: 14.3 g/dL (ref 13.0–17.0)
MCH: 31.8 pg (ref 26.0–34.0)
MCHC: 34.8 g/dL (ref 30.0–36.0)
MCV: 91.3 fL (ref 78.0–100.0)
Platelets: 227 10*3/uL (ref 150–400)
RBC: 4.5 MIL/uL (ref 4.22–5.81)
RDW: 12.6 % (ref 11.5–15.5)
WBC: 6.3 10*3/uL (ref 4.0–10.5)

## 2017-03-10 LAB — BASIC METABOLIC PANEL
ANION GAP: 8 (ref 5–15)
BUN: 19 mg/dL (ref 6–20)
CALCIUM: 8.6 mg/dL — AB (ref 8.9–10.3)
CO2: 20 mmol/L — AB (ref 22–32)
Chloride: 112 mmol/L — ABNORMAL HIGH (ref 101–111)
Creatinine, Ser: 0.76 mg/dL (ref 0.61–1.24)
GFR calc Af Amer: >60 mL/min (ref >60)
GFR calc non Af Amer: >60 mL/min (ref >60)
Glucose, Bld: 123 mg/dL — ABNORMAL HIGH (ref 65–99)
POTASSIUM: 3.7 mmol/L (ref 3.5–5.1)
Sodium: 140 mmol/L (ref 135–145)

## 2017-03-10 LAB — I-STAT TROPONIN, ED: Troponin i, poc: 0 ng/mL (ref 0.00–0.08)

## 2017-03-10 MED ORDER — TETANUS-DIPHTH-ACELL PERTUSSIS 5-2.5-18.5 LF-MCG/0.5 IM SUSP
0.5000 mL | Freq: Once | INTRAMUSCULAR | Status: AC
  Administered 2017-03-10: 0.5 mL via INTRAMUSCULAR
  Filled 2017-03-10: qty 0.5

## 2017-03-10 MED ORDER — BACITRACIN ZINC 500 UNIT/GM EX OINT
TOPICAL_OINTMENT | Freq: Two times a day (BID) | CUTANEOUS | Status: DC
  Filled 2017-03-10: qty 0.9

## 2017-03-10 NOTE — Discharge Instructions (Signed)
Wash the abrasions on your legs and elbow daily with soap and water and place a thin layer of bacitracin ointment over the wounds The cut and cover with a sterile bandage. Signs of infection include redness around, swelling, drainage from the wounds or fever. See your doctor or an urgent care center if you think he might be developing an infection. Don't remove the splint from your finger until you're seen by the hand surgeon Dr. Amedeo Plenty. Call his office in 2 days to schedule the next available appointment. Return if concern for any reason
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
## 2017-03-10 NOTE — ED Notes (Signed)
Per physician apply antibiotic ointment and apply dressing to wounds. Wounds were cleaned. Ortho tech applied finger splint.
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
## 2017-03-10 NOTE — Progress Notes (Signed)
Orthopedic Tech Progress Note Patient Details:  Max Ward [facility] 19-Feb-1971 [id number]  Ortho Devices Type of Ortho Device: Finger splint Ortho Device/Splint Location: RUE pinky Ortho Device/Splint Interventions: Ordered, Application   Braulio Bosch 03/10/2017, 3:22 PM

## 2017-03-13 DIAGNOSIS — M25561 Pain in right knee: Secondary | ICD-10-CM

## 2017-03-13 DIAGNOSIS — G8929 Other chronic pain: Secondary | ICD-10-CM | POA: Insufficient documentation

## 2017-06-27 DIAGNOSIS — K029 Dental caries, unspecified: Secondary | ICD-10-CM | POA: Insufficient documentation

## 2017-08-29 DIAGNOSIS — R002 Palpitations: Secondary | ICD-10-CM | POA: Insufficient documentation

## 2017-12-19 DIAGNOSIS — M6740 Ganglion, unspecified site: Secondary | ICD-10-CM

## 2017-12-19 DIAGNOSIS — S6991XS Unspecified injury of right wrist, hand and finger(s), sequela: Secondary | ICD-10-CM | POA: Insufficient documentation

## 2018-06-20 ENCOUNTER — Encounter (HOSPITAL_COMMUNITY): Payer: Self-pay

## 2018-06-20 ENCOUNTER — Ambulatory Visit (HOSPITAL_COMMUNITY)
Admission: EM | Admit: 2018-06-20 | Discharge: 2018-06-20 | Disposition: A | Discharge status: Home / Self Care | Payer: Medicare Other | Attending: Family Medicine | Admitting: Family Medicine

## 2018-06-20 DIAGNOSIS — M67431 Ganglion, right wrist: Secondary | ICD-10-CM

## 2018-06-20 DIAGNOSIS — R0602 Shortness of breath: Secondary | ICD-10-CM

## 2018-06-20 NOTE — ED Notes (Signed)
Pt advised to go down to the ER given symptoms, pt refused. Stated he just wanted Dr. To look at his cyst on his wrist and that is it. Refusing to go to er. Dr. Meda Coffee at bedside

## 2018-06-20 NOTE — ED Provider Notes (Signed)
[facility]    CSN: [id number] Arrival date & time: 06/20/18  0806     History   Chief Complaint Chief Complaint  Patient presents with  . Shortness of Breath  . Palpitations  . Cyst  . Otalgia    HPI Max Ward is a 47 y.o. male.   HPI  Patient comes in with multiple complaints.  Initially complained of palpitations and shortness of breath.  An EKG was performed.  The EKG is abnormal.  It shows sinus rhythm with frequent PVCs, possible left atrial enlargement, T wave abnormality consider lateral ischemia.  When triage reported the EKG to me with history of palpitations and shortness of breath, I advised the patient go to the emergency room for care.  He refused.  I went in to speak with the patient.  He tells me that he always has irregular heartbeats and frequently has shortness of breath, and that he has a "dilated heart".  He is not under the care of a cardiologist.  He has had a cardiac ablation and a valve replacement in the past and is on long-standing Coumadin.  I looked at his prior EKG from 2018 and it is the same.  The findings are therefore not acute. I asked the patient about his shortness of breath and he states the reason he is here is because of his ganglion.  He shows me a ganglion on the volar aspect of his right wrist and states it is making his thumb Numbness.  He states it severely painful.  He requests treatment for his ganglion today.  I explained to him that this is not something we can do at an urgent care center.  Told him I will refer him to an orthopedist.  He has been seen by [facility] orthopedics in the past.  I called this office and they stated that they could see him next week.  I told the patient I would not be giving him pain medicine but if it was uncomfortable because it was inflamed, some prednisone might help.  He refused prednisone.  I reviewed the medical record and his last family practice visit was in March of this year.  At that  time his ganglion was evaluated.  He was offered an orthopedic referral at that time.   Past Medical History:  Diagnosis Date  . Anxiety   . Asthma   . Bipolar 1 disorder ([place])   . Coronary artery disease   . Depression   . Heart disease   . Hypertension   . Mental disorder   . Seizures ([place])   . Tachycardia     Patient Active Problem List   Diagnosis Date Noted  . Schizoaffective disorder, bipolar type ([place]) 01/18/2012    Past Surgical History:  Procedure Laterality Date  . BONE TUMOR EXCISION     skull  . CARDIAC CATHETERIZATION    . MECHANICAL AORTIC VALVE REPLACEMENT         Home Medications    Prior to Admission medications   Medication Sig Start Date End Date Taking? Authorizing Provider  albuterol (ACCUNEB) 1.25 MG/3ML nebulizer solution Take 1 ampule by nebulization every 6 (six) hours as needed for wheezing.   Yes [provider]  alprazolam Duanne Moron) 2 MG tablet Take 2 mg by mouth 4 (four) times daily as needed for sleep.   Yes [provider]  amphetamine-dextroamphetamine (ADDERALL XR) 25 MG 24 hr capsule Take 25 mg by mouth 2 (two) times daily.  Yes [provider]  Asenapine Maleate (SAPHRIS) 10 MG SUBL Place 30 mg under the tongue at bedtime.   Yes [provider]  azelastine (ASTELIN) 0.1 % nasal spray Place 1 spray into both nostrils 2 (two) times daily. Use in each nostril as directed   Yes [provider]  cetirizine (ZYRTEC) 10 MG chewable tablet Chew 10 mg by mouth daily.   Yes [provider]  metoprolol succinate (TOPROL-XL) 50 MG 24 hr tablet Take 50 mg by mouth daily. Take with or immediately following a meal.   Yes [provider]  warfarin (COUMADIN) 5 MG tablet Take 1 tablet (5 mg total) by mouth daily. 09/27/14  Yes Ward, Delice Bison, DO  traZODone (DESYREL) 50 MG tablet Take 1 tablet (50 mg total) by mouth at bedtime as needed for sleep. For sleep Patient taking differently: Take  100 mg by mouth at bedtime as needed for sleep.  01/25/12 03/10/17  Greig Castilla, FNP    Family History Family History  Problem Relation Age of Onset  . Cancer Mother   . Emphysema Father     Social History Social History   Tobacco Use  . Smoking status: Current Every Day Smoker    Packs/day: 1.00    Years: 15.00    Pack years: 15.00    Types: Cigarettes  . Smokeless tobacco: Never Used  Substance Use Topics  . Alcohol use: Yes  . Drug use: No     Allergies   Molds & smuts and Valium [diazepam]   Review of Systems Review of Systems  Constitutional: Negative for chills and fever.  HENT: Negative for ear pain and sore throat.   Eyes: Negative for pain and visual disturbance.  Respiratory: Positive for shortness of breath. Negative for cough.   Cardiovascular: Positive for palpitations. Negative for chest pain.  Gastrointestinal: Negative for abdominal pain and vomiting.  Genitourinary: Negative for dysuria and hematuria.  Musculoskeletal: Positive for joint swelling. Negative for arthralgias and back pain.  Skin: Negative for color change and rash.  Neurological: Negative for seizures and syncope.  All other systems reviewed and are negative.    Physical Exam Triage Vital Signs ED Triage Vitals [06/20/18 0835]  Enc Vitals Group     BP 140/84     Pulse Rate (!) 141     Resp 18     Temp 97.9 F (36.6 C)     Temp src      SpO2 100 %     Weight      Height      Head Circumference      Peak Flow      Pain Score 8     Pain Loc      Pain Edu?      Excl. in [place]?    No data found.  Updated Vital Signs BP 140/84   Pulse (!) 141   Temp 97.9 F (36.6 C)   Resp 18   SpO2 100%  :     Physical Exam  Constitutional: He appears well-developed and well-nourished. He does not appear ill. No distress.  HENT:  Head: Normocephalic and atraumatic.  Mouth/Throat: Oropharynx is clear and moist.  Eyes: Pupils are equal, round, and reactive to light.  Conjunctivae are normal.  Neck: Normal range of motion.  Cardiovascular: Normal rate.  Murmur heard. Frequent ectopy  Pulmonary/Chest: Effort normal. No respiratory distress.  Abdominal: Soft. He exhibits no distension.  Musculoskeletal: Normal range of motion. He exhibits  no edema.  Right wrist has a ganglion volar aspect over the radial carpal region.  It is nontender to touch.  Neurological: He is alert.  Skin: Skin is warm and dry.  Psychiatric:  Patient is irritable.  Somewhat angry when challenged about his heart disease.     UC Treatments / Results  Labs (all labs ordered are listed, but only abnormal results are displayed) Labs Reviewed - No data to display  EKG None  Radiology No results found.  Procedures Procedures (including critical care time)  Medications Ordered in UC Medications - No data to display  Initial Impression / Assessment and Plan / UC Course  I have reviewed the triage vital signs and the nursing notes.  Pertinent labs & imaging results that were available during my care of the patient were reviewed by me and considered in my medical decision making (see chart for details).     I explained to the patient that he needs to be under the care of cardiologist.  I recommend he go back to his primary care doctor for referral.  I did call [facility] orthopedics for him.  They can see him next week.  Patient is advised to call the office for an appointment. He went back into discharge the patient discuss his follow-up appointment with him but he would not get off the telephone on it up to speak with me.  When the nurse went to discharge him he complained that his ear had not been examined.  Patient did not complain to me about ear pain when asked in the room with him, and since he did not get off the phone long enough to speak with me when I went in to discharge him this was omitted.  He was advised he could wait if he wanted me to come back in the room and  see him, and he decided to leave. Final Clinical Impressions(s) / UC Diagnoses   Final diagnoses:  Ganglion cyst of volar aspect of right wrist  Shortness of breath     Discharge Instructions     [facility] is now in [place] Their phone number is [phone] The earliest appointment is next week.  You must call the office to schedule an appointment    ED Prescriptions    None     Controlled Substance Prescriptions [place] Controlled Substance Registry consulted? Not Applicable   Raylene Everts, MD 06/20/18 1202

## 2018-06-20 NOTE — Discharge Instructions (Addendum)
[facility] is now in [place] Their phone number is [phone] The earliest appointment is next week.  You must call the office to schedule an appointment

## 2018-06-20 NOTE — ED Triage Notes (Signed)
Pt presents with multiple complaints. Complains of shortness of breath, palpitations, a cyst on his right hand and pain in right ear. Patient is tachycardic in triage. Reports history of same.

## 2018-06-24 ENCOUNTER — Ambulatory Visit (INDEPENDENT_AMBULATORY_CARE_PROVIDER_SITE_OTHER): Payer: Medicare Other

## 2018-06-24 ENCOUNTER — Encounter (INDEPENDENT_AMBULATORY_CARE_PROVIDER_SITE_OTHER): Payer: Self-pay | Admitting: Orthopaedic Surgery

## 2018-06-24 ENCOUNTER — Ambulatory Visit (INDEPENDENT_AMBULATORY_CARE_PROVIDER_SITE_OTHER): Payer: Medicare Other | Admitting: Orthopaedic Surgery

## 2018-06-24 DIAGNOSIS — M25531 Pain in right wrist: Secondary | ICD-10-CM

## 2018-06-24 DIAGNOSIS — M25521 Pain in right elbow: Secondary | ICD-10-CM

## 2018-06-24 DIAGNOSIS — M79644 Pain in right finger(s): Secondary | ICD-10-CM
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
## 2018-07-02 ENCOUNTER — Other Ambulatory Visit (INDEPENDENT_AMBULATORY_CARE_PROVIDER_SITE_OTHER): Payer: Self-pay | Admitting: Orthopaedic Surgery

## 2018-07-02 DIAGNOSIS — T1590XA Foreign body on external eye, part unspecified, unspecified eye, initial encounter: Secondary | ICD-10-CM

## 2018-07-07 ENCOUNTER — Ambulatory Visit
Admission: RE | Admit: 2018-07-07 | Discharge: 2018-07-07 | Disposition: A | Discharge status: Home / Self Care | Payer: Medicare Other | Source: Ambulatory Visit | Attending: Orthopaedic Surgery | Admitting: Orthopaedic Surgery

## 2018-07-07 DIAGNOSIS — T1590XA Foreign body on external eye, part unspecified, unspecified eye, initial encounter: Secondary | ICD-10-CM

## 2018-07-13 ENCOUNTER — Ambulatory Visit
Admission: RE | Admit: 2018-07-13 | Discharge: 2018-07-13 | Disposition: A | Discharge status: Home / Self Care | Payer: Medicare Other | Source: Ambulatory Visit | Attending: Orthopaedic Surgery | Admitting: Orthopaedic Surgery

## 2018-07-13 DIAGNOSIS — M25521 Pain in right elbow: Secondary | ICD-10-CM

## 2018-07-13 DIAGNOSIS — M25531 Pain in right wrist: Secondary | ICD-10-CM

## 2018-07-14 DIAGNOSIS — I493 Ventricular premature depolarization: Secondary | ICD-10-CM | POA: Insufficient documentation

## 2018-07-14 DIAGNOSIS — R0602 Shortness of breath: Secondary | ICD-10-CM | POA: Insufficient documentation

## 2018-07-15 ENCOUNTER — Encounter (INDEPENDENT_AMBULATORY_CARE_PROVIDER_SITE_OTHER): Payer: Self-pay | Admitting: Orthopaedic Surgery

## 2018-07-15 ENCOUNTER — Ambulatory Visit (INDEPENDENT_AMBULATORY_CARE_PROVIDER_SITE_OTHER): Payer: Medicare Other | Admitting: Orthopaedic Surgery

## 2018-07-15 DIAGNOSIS — M25521 Pain in right elbow: Secondary | ICD-10-CM | POA: Diagnosis not present

## 2018-07-15 DIAGNOSIS — M25531 Pain in right wrist: Secondary | ICD-10-CM

## 2018-07-15 NOTE — Progress Notes (Signed)
Office Visit Note   Patient: Max Ward           Date of Birth: 29-Dec-1970           MRN: [id number] Visit Date: 07/15/2018              Requested by: Houston Siren., MD [address] [place], [place] [address] PCP: Houston Siren., MD   Assessment & Plan: Visit Diagnoses:  1. Pain in right elbow   2. Pain in right wrist     Plan: MRI findings are consistent with subcutaneous ganglion cysts of his right medial elbow and right volar wrist.  The wrist one seems to be more symptomatic and causing him problems and pain with ADLs.  Unfortunately he has been having issues with his heart and he is supposed to follow-up with his cardiologist regarding this on Friday.  I recommend that he get this sorted out first before considering any elective surgery is to have his ganglion cyst removed.  He is in agreement with the plan.  Follow-up as needed.  He will call us back when he is ready to schedule surgery.  He is on Coumadin for mechanical heart valve which means that he will need to be bridged with Lovenox preoperatively. Total face to face encounter time was greater than 25 minutes and over half of this time was spent in counseling and/or coordination of care.  Follow-Up Instructions: Return if symptoms worsen or fail to improve.   Orders:  No orders of the defined types were placed in this encounter.  No orders of the defined types were placed in this encounter.     Procedures: No procedures performed   Clinical Data: No additional findings.   Subjective: Chief Complaint  Patient presents with  . Right Elbow - Pain    Max Ward comes in today for MRI review of his right wrist and right elbow.  He states that he has to see his cardiologist this Friday regarding possible heart blockage.   Review of Systems  Constitutional: Negative.   All other systems reviewed and are negative.    Objective: Vital Signs: There were no vitals taken for this visit.  Physical  Exam  Constitutional: He is oriented to person, place, and time. He appears well-developed and well-nourished.  Pulmonary/Chest: Effort normal.  Abdominal: Soft.  Neurological: He is alert and oriented to person, place, and time.  Skin: Skin is warm.  Psychiatric: He has a normal mood and affect. His behavior is normal. Judgment and thought content normal.  Nursing note and vitals reviewed.   Ortho Exam Right wrist and right elbow exam show to subcutaneous ganglion cyst that transilluminates with light.  There is no overlying skin changes. Specialty Comments:  No specialty comments available.  Imaging: No results found.   PMFS History: Patient Active Problem List   Diagnosis Date Noted  . Finger pain, right 06/24/2018  . Pain in right elbow 06/24/2018  . Pain in right wrist 06/24/2018  . Schizoaffective disorder, bipolar type ([place]) 01/18/2012   Past Medical History:  Diagnosis Date  . Anxiety   . Asthma   . Bipolar 1 disorder ([place])   . Coronary artery disease   . Depression   . Heart disease   . Hypertension   . Mental disorder   . Seizures ([place])   . Tachycardia     Family History  Problem Relation Age of Onset  . Cancer Mother   . Emphysema Father  Past Surgical History:  Procedure Laterality Date  . BONE TUMOR EXCISION     skull  . CARDIAC CATHETERIZATION    . MECHANICAL AORTIC VALVE REPLACEMENT     Social History   Occupational History  . Not on file  Tobacco Use  . Smoking status: Current Every Day Smoker    Packs/day: 1.00    Years: 15.00    Pack years: 15.00    Types: Cigarettes  . Smokeless tobacco: Never Used  Substance and Sexual Activity  . Alcohol use: Yes  . Drug use: No  . Sexual activity: Not on file

## 2018-07-30 ENCOUNTER — Telehealth (INDEPENDENT_AMBULATORY_CARE_PROVIDER_SITE_OTHER): Payer: Self-pay | Admitting: Orthopaedic Surgery

## 2018-07-30 NOTE — Telephone Encounter (Signed)
Patient left a message stating that he is needing for Dr. Erlinda Hong to request clearance from [facility] (Fax:[phone]) his cardiologist so that he can get his Ganglion Cyst removed.  Patient's CB#[phone].  Thank you.

## 2018-07-30 NOTE — Telephone Encounter (Signed)
See message below °

## 2018-07-30 NOTE — Telephone Encounter (Signed)
Ok let's get clearance.  Thanks.

## 2018-07-31 ENCOUNTER — Ambulatory Visit (INDEPENDENT_AMBULATORY_CARE_PROVIDER_SITE_OTHER): Payer: Medicare Other | Admitting: Pulmonary Disease

## 2018-07-31 ENCOUNTER — Encounter: Payer: Self-pay | Admitting: Pulmonary Disease

## 2018-07-31 VITALS — BP 104/64 | HR 85 | Ht 68.5 in | Wt 193.0 lb

## 2018-07-31 DIAGNOSIS — R0602 Shortness of breath: Secondary | ICD-10-CM | POA: Diagnosis not present

## 2018-07-31 DIAGNOSIS — Z952 Presence of prosthetic heart valve: Secondary | ICD-10-CM | POA: Diagnosis not present

## 2018-07-31 MED ORDER — ALBUTEROL SULFATE HFA 108 (90 BASE) MCG/ACT IN AERS: INHALATION_SPRAY | RESPIRATORY_TRACT | 5 refills | Status: DC

## 2018-07-31 NOTE — Patient Instructions (Signed)
We will order breathing test (full pulmonary function test) before you next visit next week. We discussed smoking cessation. Quit Smoking handout was given today.  Albuterol inhaler refilled. Please continue your albuterol every 3-4 hours as needed for shortness of breath or wheezing. We will place referral to Cardiology at [facility] as requested

## 2018-07-31 NOTE — Progress Notes (Signed)
Synopsis: Self-referred in 2019 for shortness of breath  Subjective:   PATIENT ID: Max Ward GENDER: male DOB: 21-Jul-1971, MRN: [id number]   HPI  Chief Complaint  Patient presents with  . 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  Breathed in dust and feels this was the cause.  Was told he has asthma and emphysema.  Wants another opinion   Max Ward is a 47 year old male with asthma, bicuspid AV with regurg s/p mechanical aortic valve replacement (in 2014 at [facility]), SVT and anxiety/depression who presents to Pulmonary clinic for chronic dyspnea.  Since childhood, he reports having respiratory issues however was not on any medications due to financial burden. He was clinically diagnosed with asthma/COPD in the 1990s and has intermittently used albuterol. For the last two months, he has had worsening dyspnea and wheezing and began using his Advair and albuterol more routinely. He has been taking Advair 2 puffs daily and and albuterol inhaler two times daily. He will wake up short of breath along with productive cough. He feels his symptoms are limiting his ability to perform regular activities. He is able to go grocery shopping and perform activities however feels dyspneic. Symptoms are aggravated by activity, weather changes and dust. Symptoms are improved with rest. Denies any hospitalizations or urgent visits related to his lungs.  He complains of headaches, sore throat, cough, wheezing and shortness of breath. Also has ear aches, nasal drainage, reflux.  Has not received influenza vaccine. States he need to receive high dose due to his multiple health problems.  Social: Current smoker.15 pack years Previously worked in Teacher, music, Architect, Biomedical scientist  Past Medical History:  Diagnosis Date  . Anxiety   . Asthma   . Bipolar 1 disorder ([place])   . Coronary artery disease   . Depression   . Heart disease    . Hypertension   . Mental disorder   . Seizures ([place])   . Tachycardia      Family History  Problem Relation Age of Onset  . Cancer Mother   . Emphysema Father      Social History   Socioeconomic History  . Marital status: Legally Separated    Spouse name: Not on file  . Number of children: Not on file  . Years of education: Not on file  . Highest education level: Not on file  Occupational History  . Not on file  Social Needs  . Financial resource strain: Not on file  . Food insecurity:    Worry: Not on file    Inability: Not on file  . Transportation needs:    Medical: Not on file    Non-medical: Not on file  Tobacco Use  . Smoking status: Current Every Day Smoker    Packs/day: 1.00    Years: 15.00    Pack years: 15.00    Types: Cigarettes  . Smokeless tobacco: Never Used  Substance and Sexual Activity  . Alcohol use: Yes  . Drug use: No  . Sexual activity: Not on file  Lifestyle  . Physical activity:    Days per week: Not on file    Minutes per session: Not on file  . Stress: Not on file  Relationships  . Social connections:    Talks on phone: Not on file    Gets together: Not on file    Attends religious service: Not on file    Active member of  club or organization: Not on file    Attends meetings of clubs or organizations: Not on file    Relationship status: Not on file  . Intimate partner violence:    Fear of current or ex partner: Not on file    Emotionally abused: Not on file    Physically abused: Not on file    Forced sexual activity: Not on file  Other Topics Concern  . Not on file  Social History Narrative  . Not on file     Allergies  Allergen Reactions  . Molds & Smuts Other (See Comments)    Unknown  . Valium [Diazepam] Palpitations     Outpatient Medications Prior to Visit  Medication Sig Dispense Refill  . alprazolam (XANAX) 2 MG tablet Take 2 mg by mouth 4 (four) times daily as needed for sleep.    Marland Kitchen  amphetamine-dextroamphetamine (ADDERALL XR) 25 MG 24 hr capsule Take 25 mg by mouth 2 (two) times daily.    . Asenapine Maleate (SAPHRIS) 10 MG SUBL Place 30 mg under the tongue at bedtime.    Marland Kitchen azelastine (ASTELIN) 0.1 % nasal spray Place 1 spray into both nostrils 2 (two) times daily. Use in each nostril as directed    . metoprolol succinate (TOPROL-XL) 50 MG 24 hr tablet Take 50 mg by mouth daily. Take with or immediately following a meal.     . warfarin (COUMADIN) 5 MG tablet Take 1 tablet (5 mg total) by mouth daily. 30 tablet 0  . albuterol (ACCUNEB) 1.25 MG/3ML nebulizer solution Take 1 ampule by nebulization every 6 (six) hours as needed for wheezing.     . cetirizine (ZYRTEC) 10 MG chewable tablet Chew 10 mg by mouth daily.    . traZODone (DESYREL) 50 MG tablet Take 1 tablet (50 mg total) by mouth at bedtime as needed for sleep. For sleep (Patient taking differently: Take 100 mg by mouth at bedtime as needed for sleep. ) 30 tablet 0   No facility-administered medications prior to visit.     Review of Systems  Constitutional: Positive for malaise/fatigue. Negative for chills, fever and weight loss.  HENT: Positive for congestion, ear discharge and sore throat.   Eyes: Negative for blurred vision, pain and redness.  Respiratory: Positive for cough, sputum production, shortness of breath and wheezing. Negative for hemoptysis.   Cardiovascular: Positive for chest pain and PND. Negative for palpitations, orthopnea and leg swelling.  Gastrointestinal: Positive for heartburn. Negative for abdominal pain and nausea.  Musculoskeletal: Positive for joint pain.  Skin: Negative for rash.  Neurological: Positive for dizziness and headaches.  Endo/Heme/Allergies: Bruises/bleeds easily.  Psychiatric/Behavioral: Positive for depression. Negative for substance abuse and suicidal ideas.      Objective:  Physical Exam   Vitals:   07/31/18 1342  BP: 104/64  Pulse: 85  SpO2: 95%  Weight: 193  lb (87.5 kg)  Height: 5' 8.5" (1.74 m)    Gen: well appearing, no acute distress HENT: NCAT, OP clear, neck supple without masses Eyes: PERRL, EOMi Lymph: no cervical lymphadenopathy PULM: CTA B CV: irregular heart rate, no mgr, no JVD GI: BS+, soft, nontender, no hsm Derm: no rash or skin breakdown MSK: normal bulk and tone Neuro: A&Ox4, CN II-XII intact, strength 5/5 in all 4 extremities Psyche: normal mood and affect  CBC    Component Value Date/Time   WBC 6.3 03/10/2017 1220   RBC 4.50 03/10/2017 1220   HGB 14.3 03/10/2017 1220   HCT 41.1 03/10/2017 1220  PLT 227 03/10/2017 1220   MCV 91.3 03/10/2017 1220   MCH 31.8 03/10/2017 1220   MCHC 34.8 03/10/2017 1220   RDW 12.6 03/10/2017 1220   LYMPHSABS 1.7 09/27/2014 2002   MONOABS 0.7 09/27/2014 2002   EOSABS 0.2 09/27/2014 2002   BASOSABS 0.0 09/27/2014 2002    Chest imaging: CXR 03/10/17 - No acute abnormality CTA 09/27/14 - Lung fields with no abnormality  PFT: None on file  Echo: TTE@ [facility] 07/18/18 SUMMARY  The left ventricular size is normal.  There is normal left ventricular wall thickness.  LV ejection fraction = 60-65%.    Left ventricular systolic function is normal.  No segmental wall motion abnormalities seen in the left ventricle  The right ventricle is normal in size and function.  There is a mechanical aortic valve.  Aortic valve mean pressure gradient is 23.3 mmHg.  There is mild tricuspid regurgitation.  Trace pulmonic valvular regurgitation.  The aortic root is normal size.  IVC size was mildly dilated.  There is no pericardial effusion.  There is no comparison study available.  -   I personally reviewed the above images and test results as noted.   Assessment & Plan:   Shortness of breath - Plan: Pulmonary function test  H/O aortic valve replacement - Plan: Ambulatory referral to Cardiology  Discussion: 47 year old male with mechanical aortic valve,  palpitations who presents for shortness of breath. Patient with  Shortness of breath: We will order breathing test (full pulmonary function test) before you next visit next week. We discussed smoking cessation. Quit Smoking handout was given today.  Albuterol inhaler refilled. Continue Advair as directed. Will reassess need after breathing test. Please continue your albuterol every 3-4 hours as needed for shortness of breath or wheezing. We will place referral to Cardiology at [facility] as requested Administer high dose influenza vaccine at next visit  Follow-up next week to discuss PFTs and additional work-up/treatment as warranted.  Foster Sonnier Rodman Pickle, MD [facility] 07/31/2018 2:45 PM  Personal pager: [phone] If unanswered, please page CCM On-call: [phone]    Current Outpatient Medications:  .  alprazolam (XANAX) 2 MG tablet, Take 2 mg by mouth 4 (four) times daily as needed for sleep., Disp: , Rfl:  .  amphetamine-dextroamphetamine (ADDERALL XR) 25 MG 24 hr capsule, Take 25 mg by mouth 2 (two) times daily., Disp: , Rfl:  .  Asenapine Maleate (SAPHRIS) 10 MG SUBL, Place 30 mg under the tongue at bedtime., Disp: , Rfl:  .  azelastine (ASTELIN) 0.1 % nasal spray, Place 1 spray into both nostrils 2 (two) times daily. Use in each nostril as directed, Disp: , Rfl:  .  metoprolol succinate (TOPROL-XL) 50 MG 24 hr tablet, Take 50 mg by mouth daily. Take with or immediately following a meal. , Disp: , Rfl:  .  warfarin (COUMADIN) 5 MG tablet, Take 1 tablet (5 mg total) by mouth daily., Disp: 30 tablet, Rfl: 0 .  albuterol (ACCUNEB) 1.25 MG/3ML nebulizer solution, Take 1 ampule by nebulization every 6 (six) hours as needed for wheezing. , Disp: , Rfl:  .  cetirizine (ZYRTEC) 10 MG chewable tablet, Chew 10 mg by mouth daily., Disp: , Rfl:  .  traZODone (DESYREL) 50 MG tablet, Take 1 tablet (50 mg total) by mouth at bedtime as needed for sleep. For sleep (Patient  taking differently: Take 100 mg by mouth at bedtime as needed for sleep. ), Disp: 30 tablet, Rfl: 0

## 2018-08-05 ENCOUNTER — Ambulatory Visit (INDEPENDENT_AMBULATORY_CARE_PROVIDER_SITE_OTHER): Payer: Medicare Other | Admitting: Pulmonary Disease

## 2018-08-05 ENCOUNTER — Encounter: Payer: Self-pay | Admitting: Pulmonary Disease

## 2018-08-05 ENCOUNTER — Other Ambulatory Visit (INDEPENDENT_AMBULATORY_CARE_PROVIDER_SITE_OTHER): Payer: Medicare Other

## 2018-08-05 VITALS — BP 106/78 | HR 65 | Ht 67.5 in | Wt 193.0 lb

## 2018-08-05 DIAGNOSIS — J45909 Unspecified asthma, uncomplicated: Secondary | ICD-10-CM

## 2018-08-05 DIAGNOSIS — R0602 Shortness of breath: Secondary | ICD-10-CM

## 2018-08-05 DIAGNOSIS — J4540 Moderate persistent asthma, uncomplicated: Secondary | ICD-10-CM

## 2018-08-05 LAB — PULMONARY FUNCTION TEST
DL/VA % pred: 85 %
DL/VA: 3.82 ml/min/mmHg/L
DLCO UNC % PRED: 89 %
DLCO unc: 25.83 ml/min/mmHg
FEF 25-75 PRE: 2.23 L/s
FEF 25-75 Post: 4.06 L/sec
FEF2575-%CHANGE-POST: 81 %
FEF2575-%PRED-POST: 119 %
FEF2575-%PRED-PRE: 65 %
FEV1-%Change-Post: 21 %
FEV1-%Pred-Post: 99 %
FEV1-%Pred-Pre: 81 %
FEV1-PRE: 3.01 L
FEV1-Post: 3.68 L
FEV1FVC-%CHANGE-POST: 7 %
FEV1FVC-%Pred-Pre: 92 %
FEV6-%Change-Post: 13 %
FEV6-%PRED-POST: 103 %
FEV6-%Pred-Pre: 91 %
FEV6-PRE: 4.17 L
FEV6-Post: 4.73 L
FEV6FVC-%CHANGE-POST: 0 %
FEV6FVC-%PRED-PRE: 103 %
FEV6FVC-%Pred-Post: 103 %
FVC-%Change-Post: 13 %
FVC-%PRED-POST: 100 %
FVC-%Pred-Pre: 88 %
FVC-Post: 4.73 L
FVC-Pre: 4.17 L
POST FEV1/FVC RATIO: 78 %
POST FEV6/FVC RATIO: 100 %
PRE FEV1/FVC RATIO: 72 %
Pre FEV6/FVC Ratio: 100 %

## 2018-08-05 LAB — CBC WITH DIFFERENTIAL/PLATELET
Basophils Absolute: 0 10*3/uL (ref 0.0–0.1)
Basophils Relative: 0.4 % (ref 0.0–3.0)
EOS PCT: 1.1 % (ref 0.0–5.0)
Eosinophils Absolute: 0.1 10*3/uL (ref 0.0–0.7)
HCT: 50 % (ref 39.0–52.0)
Hemoglobin: 17.1 g/dL — ABNORMAL HIGH (ref 13.0–17.0)
LYMPHS ABS: 1.8 10*3/uL (ref 0.7–4.0)
Lymphocytes Relative: 16.8 % (ref 12.0–46.0)
MCHC: 34.1 g/dL (ref 30.0–36.0)
MCV: 91.8 fl (ref 78.0–100.0)
MONO ABS: 0.7 10*3/uL (ref 0.1–1.0)
MONOS PCT: 6.9 % (ref 3.0–12.0)
NEUTROS ABS: 8 10*3/uL — AB (ref 1.4–7.7)
NEUTROS PCT: 74.8 % (ref 43.0–77.0)
PLATELETS: 234 10*3/uL (ref 150.0–400.0)
RBC: 5.45 Mil/uL (ref 4.22–5.81)
RDW: 13 % (ref 11.5–15.5)
WBC: 10.6 10*3/uL — ABNORMAL HIGH (ref 4.0–10.5)

## 2018-08-05 MED ORDER — FLUTICASONE-SALMETEROL 250-50 MCG/DOSE IN AEPB: 1.0000 | INHALATION_SPRAY | Freq: Two times a day (BID) | RESPIRATORY_TRACT | 11 refills | Status: DC

## 2018-08-05 MED ORDER — ALBUTEROL SULFATE (2.5 MG/3ML) 0.083% IN NEBU: 2.5000 mg | INHALATION_SOLUTION | Freq: Four times a day (QID) | RESPIRATORY_TRACT | 5 refills | Status: DC | PRN

## 2018-08-05 NOTE — Progress Notes (Signed)
Synopsis: Initially seen in 07/2018 for shortness of breath. Previously diagnosed with asthma/COPD in the 1990s and recently had worsening dyspnea and wheezing.  Subjective:   PATIENT ID: Max Ward GENDER: male DOB: 1971/03/05, MRN: [id number]   HPI  Chief Complaint  Patient presents with  . CONSULT    Still has some cough.  Throat feels "dry".  PFT today.  Doing about the same overall he says   Mr. Max Ward is a 47 year old male with asthma, bicuspid AV with regurg s/p mechanical aortic valve replacement (in 2014 at [facility]), SVT and anxiety/depression who presents for follow-up of chronic dyspnea.  Last seen on 07/31/18 with me in clinic. For the last two months, he has had worsening dyspnea and wheezing. Has symptoms during the day but also reports paroxysmal nocturnal dyspnea associated with productive cough. He feels his symptoms are limiting his ability to perform regular activities. Symptoms are aggravated by smoking and activity. Symptoms are improved with bronchodilators. Denies any hospitalizations or urgent visits related to his lungs. Since our last visit, he has used his albuterol more consistently however has not taken the Advair over the weekend.  Still active smoker. Trying to cut down. Only smoked 1/2 ppd, which is improved from 1ppd.   Social: Current smoker. 15 pack years Previously worked in Teacher, music, Architect, Biomedical scientist  Past Medical History:  Diagnosis Date  . Anxiety   . Asthma   . Bipolar 1 disorder ([place])   . Coronary artery disease   . Depression   . Heart disease   . Hypertension   . Mental disorder   . Seizures ([place])   . Tachycardia      Family History  Problem Relation Age of Onset  . Cancer Mother   . Emphysema Father      Social History   Socioeconomic History  . Marital status: Legally Separated    Spouse name: Not on file  . Number of children: Not on file  . Years of education: Not on file  . Highest  education level: Not on file  Occupational History  . Not on file  Social Needs  . Financial resource strain: Not on file  . Food insecurity:    Worry: Not on file    Inability: Not on file  . Transportation needs:    Medical: Not on file    Non-medical: Not on file  Tobacco Use  . Smoking status: Current Every Day Smoker    Packs/day: 1.00    Years: 15.00    Pack years: 15.00    Types: Cigarettes  . Smokeless tobacco: Never Used  Substance and Sexual Activity  . Alcohol use: Yes  . Drug use: No  . Sexual activity: Not on file  Lifestyle  . Physical activity:    Days per week: Not on file    Minutes per session: Not on file  . Stress: Not on file  Relationships  . Social connections:    Talks on phone: Not on file    Gets together: Not on file    Attends religious service: Not on file    Active member of club or organization: Not on file    Attends meetings of clubs or organizations: Not on file    Relationship status: Not on file  . Intimate partner violence:    Fear of current or ex partner: Not on file    Emotionally abused: Not on file    Physically abused: Not on file  Forced sexual activity: Not on file  Other Topics Concern  . Not on file  Social History Narrative  . Not on file     Allergies  Allergen Reactions  . Molds & Smuts Other (See Comments)    Unknown  . Valium [Diazepam] Palpitations     Outpatient Medications Prior to Visit  Medication Sig Dispense Refill  . albuterol (ACCUNEB) 1.25 MG/3ML nebulizer solution Take 1 ampule by nebulization every 6 (six) hours as needed for wheezing.     Marland Kitchen albuterol (PROVENTIL HFA;VENTOLIN HFA) 108 (90 Base) MCG/ACT inhaler INHALE 1-2 PUFFS BY MOUTH EVERY 4-6 HOURS AS NEEDED AND AS DIRECTED 1 Inhaler 5  . alprazolam (XANAX) 2 MG tablet Take 2 mg by mouth 4 (four) times daily as needed for sleep.    Marland Kitchen amphetamine-dextroamphetamine (ADDERALL XR) 25 MG 24 hr capsule Take 25 mg by mouth 2 (two) times daily.    .  Asenapine Maleate (SAPHRIS) 10 MG SUBL Place 30 mg under the tongue at bedtime.    Marland Kitchen azelastine (ASTELIN) 0.1 % nasal spray Place 1 spray into both nostrils 2 (two) times daily. Use in each nostril as directed    . cetirizine (ZYRTEC) 10 MG chewable tablet Chew 10 mg by mouth daily.    . metoprolol succinate (TOPROL-XL) 50 MG 24 hr tablet Take 50 mg by mouth daily. Take with or immediately following a meal.     . warfarin (COUMADIN) 5 MG tablet Take 1 tablet (5 mg total) by mouth daily. 30 tablet 0  . traZODone (DESYREL) 50 MG tablet Take 1 tablet (50 mg total) by mouth at bedtime as needed for sleep. For sleep (Patient taking differently: Take 100 mg by mouth at bedtime as needed for sleep. ) 30 tablet 0   No facility-administered medications prior to visit.     Review of Systems  Constitutional: Positive for malaise/fatigue. Negative for chills and fever.  HENT: Positive for congestion and sinus pain.   Eyes: Negative for redness.  Respiratory: Positive for cough, sputum production and shortness of breath. Negative for hemoptysis.   Cardiovascular: Positive for palpitations and PND. Negative for chest pain and leg swelling.  Gastrointestinal: Negative for heartburn and nausea.  Musculoskeletal: Negative for myalgias.  Skin: Negative for rash.  Neurological: Positive for dizziness. Negative for tremors, weakness and headaches.  Endo/Heme/Allergies: Positive for environmental allergies.  Psychiatric/Behavioral: Positive for depression. The patient is nervous/anxious.       Objective:  Physical Exam   Vitals:   08/05/18 1449  BP: 106/78  Pulse: 65  SpO2: 97%  Weight: 193 lb (87.5 kg)  Height: 5' 7.5" (1.715 m)    Gen: well appearing, no acute distress HENT: NCAT Eyes: EOMi, no scleral icterus PULM: CTA B, no wheezing or rhonchi CV: irregular heart rate, no mgr, no JVD Derm: no rash or skin breakdown MSK: normal bulk and tone Neuro: A&Ox4, CN II-XII intact, strength 5/5 in  all 4 extremities Psyche: anxious, normal judgement  CBC    Component Value Date/Time   WBC 6.3 03/10/2017 1220   RBC 4.50 03/10/2017 1220   HGB 14.3 03/10/2017 1220   HCT 41.1 03/10/2017 1220   PLT 227 03/10/2017 1220   MCV 91.3 03/10/2017 1220   MCH 31.8 03/10/2017 1220   MCHC 34.8 03/10/2017 1220   RDW 12.6 03/10/2017 1220   LYMPHSABS 1.7 09/27/2014 2002   MONOABS 0.7 09/27/2014 2002   EOSABS 0.2 09/27/2014 2002   BASOSABS 0.0 09/27/2014 2002   PFT  08/05/18: Significant bronchodilator response in FEV1 and FVC consistent asthma. Mild restrictive defect also noted.    I personally reviewed and interpreted test results as noted.   Assessment & Plan:   Uncomplicated asthma, unspecified asthma severity, unspecified whether persistent - Plan: Ambulatory Referral for DME  Moderate persistent asthma in adult without complication - Plan: IgE, CBC w/Diff  Discussion: 47 year old male with mechanical aortic valve, palpitations who presents for follow-up for moderate persistent asthma. Reviewed PFTs in-clinic. Testing and symptoms consistent with asthma. Will treat with medium dose ICS-LABA and PRN albuterol for symptom control. Will also obtain labs to assess severity of allergic component of asthma.   Moderate persistent asthma, uncontrolled, not in active exacerbation START ADVAIR 250-59mcg 1 puff twice daily CONTINUE albuterol every 4-6 hours as needed for shortness of breath or wheezing. We have ORDERED your nebulizer Labs ordered: CBC w/diff and IgE Declined influenza vaccine  Follow-up in 3 months  [place], MD [facility] 08/05/2018 3:06 PM  Personal pager: [phone] If unanswered, please page CCM On-call: [phone]    Current Outpatient Medications:  .  albuterol (ACCUNEB) 1.25 MG/3ML nebulizer solution, Take 1 ampule by nebulization every 6 (six) hours as needed for wheezing. , Disp: , Rfl:  .  albuterol (PROVENTIL  HFA;VENTOLIN HFA) 108 (90 Base) MCG/ACT inhaler, INHALE 1-2 PUFFS BY MOUTH EVERY 4-6 HOURS AS NEEDED AND AS DIRECTED, Disp: 1 Inhaler, Rfl: 5 .  alprazolam (XANAX) 2 MG tablet, Take 2 mg by mouth 4 (four) times daily as needed for sleep., Disp: , Rfl:  .  amphetamine-dextroamphetamine (ADDERALL XR) 25 MG 24 hr capsule, Take 25 mg by mouth 2 (two) times daily., Disp: , Rfl:  .  Asenapine Maleate (SAPHRIS) 10 MG SUBL, Place 30 mg under the tongue at bedtime., Disp: , Rfl:  .  azelastine (ASTELIN) 0.1 % nasal spray, Place 1 spray into both nostrils 2 (two) times daily. Use in each nostril as directed, Disp: , Rfl:  .  cetirizine (ZYRTEC) 10 MG chewable tablet, Chew 10 mg by mouth daily., Disp: , Rfl:  .  metoprolol succinate (TOPROL-XL) 50 MG 24 hr tablet, Take 50 mg by mouth daily. Take with or immediately following a meal. , Disp: , Rfl:  .  warfarin (COUMADIN) 5 MG tablet, Take 1 tablet (5 mg total) by mouth daily., Disp: 30 tablet, Rfl: 0 .  traZODone (DESYREL) 50 MG tablet, Take 1 tablet (50 mg total) by mouth at bedtime as needed for sleep. For sleep (Patient taking differently: Take 100 mg by mouth at bedtime as needed for sleep. ), Disp: 30 tablet, Rfl: 0

## 2018-08-05 NOTE — Patient Instructions (Addendum)
Moderate Persistent Asthma START ADVAIR 250-31mcg 1 puff twice daily CONTINUE albuterol every 4-6 hours as needed for shortness of breath or wheezing. We have ORDERED your nebulizer  Follow-up in 3 months   Asthma, Adult Asthma is a recurring condition in which the airways tighten and narrow. Asthma can make it difficult to breathe. It can cause coughing, wheezing, and shortness of breath. Asthma episodes, also called asthma attacks, range from minor to life-threatening. Asthma cannot be cured, but medicines and lifestyle changes can help control it. What are the causes? Asthma is believed to be caused by inherited (genetic) and environmental factors, but its exact cause is unknown. Asthma may be triggered by allergens, lung infections, or irritants in the air. Asthma triggers are different for each person. Common triggers include:  Animal dander.  Dust mites.  Cockroaches.  Pollen from trees or grass.  Mold.  Smoke.  Air pollutants such as dust, household cleaners, hair sprays, aerosol sprays, paint fumes, strong chemicals, or strong odors.  Cold air, weather changes, and winds (which increase molds and pollens in the air).  Strong emotional expressions such as crying or laughing hard.  Stress.  Certain medicines (such as aspirin) or types of drugs (such as beta-blockers).  Sulfites in foods and drinks. Foods and drinks that may contain sulfites include dried fruit, potato chips, and sparkling grape juice.  Infections or inflammatory conditions such as the flu, a cold, or an inflammation of the nasal membranes (rhinitis).  Gastroesophageal reflux disease (GERD).  Exercise or strenuous activity.  What are the signs or symptoms? Symptoms may occur immediately after asthma is triggered or many hours later. Symptoms include:  Wheezing.  Excessive nighttime or early morning coughing.  Frequent or severe coughing with a common cold.  Chest tightness.  Shortness of  breath.  How is this diagnosed? The diagnosis of asthma is made by a review of your medical history and a physical exam. Tests may also be performed. These may include:  Lung function studies. These tests show how much air you breathe in and out.  Allergy tests.  Imaging tests such as X-rays.  How is this treated? Asthma cannot be cured, but it can usually be controlled. Treatment involves identifying and avoiding your asthma triggers. It also involves medicines. There are 2 classes of medicine used for asthma treatment:  Controller medicines. These prevent asthma symptoms from occurring. They are usually taken every day.  Reliever or rescue medicines. These quickly relieve asthma symptoms. They are used as needed and provide short-term relief.  Your health care provider will help you create an asthma action plan. An asthma action plan is a written plan for managing and treating your asthma attacks. It includes a list of your asthma triggers and how they may be avoided. It also includes information on when medicines should be taken and when their dosage should be changed. An action plan may also involve the use of a device called a peak flow meter. A peak flow meter measures how well the lungs are working. It helps you monitor your condition. Follow these instructions at home:  Take medicines only as directed by your health care provider. Speak with your health care provider if you have questions about how or when to take the medicines.  Use a peak flow meter as directed by your health care provider. Record and keep track of readings.  Understand and use the action plan to help minimize or stop an asthma attack without needing to seek medical care.  Control your home environment in the following ways to help prevent asthma attacks: ? Do not smoke. Avoid being exposed to secondhand smoke. ? Change your heating and air conditioning filter regularly. ? Limit your use of fireplaces and wood  stoves. ? Get rid of pests (such as roaches and mice) and their droppings. ? Throw away plants if you see mold on them. ? Clean your floors and dust regularly. Use unscented cleaning products. ? Try to have someone else vacuum for you regularly. Stay out of rooms while they are being vacuumed and for a short while afterward. If you vacuum, use a dust mask from a hardware store, a double-layered or microfilter vacuum cleaner bag, or a vacuum cleaner with a HEPA filter. ? Replace carpet with wood, tile, or vinyl flooring. Carpet can trap dander and dust. ? Use allergy-proof pillows, mattress covers, and box spring covers. ? Wash bed sheets and blankets every week in hot water and dry them in a dryer. ? Use blankets that are made of polyester or cotton. ? Clean bathrooms and kitchens with bleach. If possible, have someone repaint the walls in these rooms with mold-resistant paint. Keep out of the rooms that are being cleaned and painted. ? Wash hands frequently. Contact a health care provider if:  You have wheezing, shortness of breath, or a cough even if taking medicine to prevent attacks.  The colored mucus you cough up (sputum) is thicker than usual.  Your sputum changes from clear or white to yellow, green, gray, or bloody.  You have any problems that may be related to the medicines you are taking (such as a rash, itching, swelling, or trouble breathing).  You are using a reliever medicine more than 2-3 times per week.  Your peak flow is still at 50-79% of your personal best after following your action plan for 1 hour.  You have a fever. Get help right away if:  You seem to be getting worse and are unresponsive to treatment during an asthma attack.  You are short of breath even at rest.  You get short of breath when doing very little physical activity.  You have difficulty eating, drinking, or talking due to asthma symptoms.  You develop chest pain.  You develop a fast  heartbeat.  You have a bluish color to your lips or fingernails.  You are light-headed, dizzy, or faint.  Your peak flow is less than 50% of your personal best. This information is not intended to replace advice given to you by your health care provider. Make sure you discuss any questions you have with your health care provider. Document Released: 10/01/2005 Document Revised: 03/14/2016 Document Reviewed: 04/30/2013 Elsevier Interactive Patient Education  2017 [employer].

## 2018-08-05 NOTE — Progress Notes (Signed)
PFT completed today.  

## 2018-08-06 ENCOUNTER — Telehealth (INDEPENDENT_AMBULATORY_CARE_PROVIDER_SITE_OTHER): Payer: Self-pay | Admitting: *Deleted

## 2018-08-06 LAB — IGE: IgE (Immunoglobulin E), Serum: 10 kU/L (ref <=114)

## 2018-08-06 NOTE — Telephone Encounter (Signed)
Pt called upset because he has not heard anything about his surgery that is suppose to be scheduled for his wrist. He states that his cardiologist has cleared him to have the surgery but has not heard back from our office. Pt has threatened that if he does not get something done soon or receive anything about his surgery he is going to change doctors. I advised pt I do not see anything where something was sent from his cardiologist to clear him and he stated that they said they had called our office and gave a verbal. I again informed him there was nothing in his chart stating this. Pt would like to have something done soon.

## 2018-08-06 NOTE — Progress Notes (Signed)
Labs reviewed. Please contact patient regarding labs. The labs related to a possible allergic component of his asthma are normal. He should continue his inhalers as directed and keep follow-up in January.

## 2018-08-06 NOTE — Telephone Encounter (Signed)
I called patient and left voice mail for return call. °

## 2018-08-07 ENCOUNTER — Telehealth: Payer: Self-pay | Admitting: Pulmonary Disease

## 2018-08-07 NOTE — Telephone Encounter (Signed)
Notes recorded by Margaretha Seeds, MD on 08/06/2018 at 10:04 PM EDT Labs reviewed. Please contact patient regarding labs. The labs related to a possible allergic component of his asthma are normal. He should continue his inhalers as directed and keep follow-up in January.  Spoke with pt and notified of results per Dr. Loanne Drilling. Pt verbalized understanding and denied any questions.

## 2018-08-19 ENCOUNTER — Ambulatory Visit (INDEPENDENT_AMBULATORY_CARE_PROVIDER_SITE_OTHER): Payer: Medicare Other | Admitting: Orthopaedic Surgery

## 2018-08-19 NOTE — Telephone Encounter (Signed)
Spoke with patient and scheduled.  He will contact Coumadin prescriber to get Lovenox bridge.

## 2018-08-21 ENCOUNTER — Telehealth: Payer: Self-pay | Admitting: Pulmonary Disease

## 2018-08-21 MED ORDER — FLUTICASONE PROPIONATE 50 MCG/ACT NA SUSP: 2.0000 | Freq: Every day | NASAL | 2 refills | Status: DC

## 2018-08-21 MED ORDER — ALBUTEROL SULFATE HFA 108 (90 BASE) MCG/ACT IN AERS: INHALATION_SPRAY | RESPIRATORY_TRACT | 5 refills | Status: DC

## 2018-08-21 MED ORDER — ALBUTEROL SULFATE (2.5 MG/3ML) 0.083% IN NEBU: 2.5000 mg | INHALATION_SOLUTION | Freq: Four times a day (QID) | RESPIRATORY_TRACT | 5 refills | Status: DC | PRN

## 2018-08-21 NOTE — Telephone Encounter (Signed)
I have called and confirmed the pharmacy  With patient and I have also received a verbal order for Flonase from the Karle Starch NP. Order placed and nothing further needed.

## 2018-08-28 ENCOUNTER — Telehealth: Payer: Self-pay

## 2018-08-28 ENCOUNTER — Encounter: Payer: Self-pay | Admitting: Internal Medicine

## 2018-08-28 ENCOUNTER — Telehealth: Payer: Self-pay | Admitting: Internal Medicine

## 2018-08-28 ENCOUNTER — Ambulatory Visit (INDEPENDENT_AMBULATORY_CARE_PROVIDER_SITE_OTHER): Payer: Medicare Other | Admitting: Internal Medicine

## 2018-08-28 VITALS — BP 122/74 | HR 81 | Ht 67.0 in | Wt 193.0 lb

## 2018-08-28 DIAGNOSIS — I471 Supraventricular tachycardia: Secondary | ICD-10-CM

## 2018-08-28 DIAGNOSIS — Q231 Congenital insufficiency of aortic valve: Secondary | ICD-10-CM | POA: Diagnosis not present

## 2018-08-28 DIAGNOSIS — F250 Schizoaffective disorder, bipolar type: Secondary | ICD-10-CM | POA: Diagnosis not present

## 2018-08-28 DIAGNOSIS — Z952 Presence of prosthetic heart valve: Secondary | ICD-10-CM

## 2018-08-28 DIAGNOSIS — I493 Ventricular premature depolarization: Secondary | ICD-10-CM

## 2018-08-28 DIAGNOSIS — M67431 Ganglion, right wrist: Secondary | ICD-10-CM

## 2018-08-28 NOTE — Progress Notes (Signed)
Cardiology Office Note:    Date:  09/16/2018   ID:  Max Ward, DOB 06-03-1971, MRN [id number]  PCP:  Houston Siren., MD  Cardiologist:  No primary care provider on file.  Electrophysiologist:  None   Referring MD: Houston Siren., MD   History of AVR for BAV  History of Present Illness:    Max Ward is a 47 y.o. male with a hx of anxiety, bipolar 1 disorder, CAD, and bicuspid aortic valve s/p AVR. He is here to establish cardiovascular care at [facility].  He has been followed by Dr. Alexandria Lodge Pu in the [facility] system. He was most recently seen on 07/14/2018.  He had been seen in the hospital just prior to that visit for atypical chest pain and an abnormal nuclear stress test with concern for inferior ischemia.  He underwent a left heart catheterization which showed no occlusive disease and very mild calcifications.  He endorsed intermittent chest discomfort associated with anxiety and panic, and he endorses this for me again today.  Per Dr. Maryjo Rochester note, adjustment of his bipolar and schizoaffective disorder medications have led to less panic attacks which then lead to less chest discomfort.  He has a history of PVCs and SVT.  The medical record also suggest that he had Wolff-Parkinson-White syndrome.  He has been told that he has an enlarged heart, likely related to his previous history of aortic valve regurgitation.  An echocardiogram was performed in the [facility] system in October.  The patient was quite keen to know if he had an enlarged heart, and his last echocardiogram demonstrates an LV end-diastolic dimension of 4.6 cm with a preserved ejection fraction.  I described to the patient that this is within the normal limits.  There was only trace aortic valve regurgitation.  Mechanical aortic valve had a mean pressure gradient of 23 mmHg, with no comparison study available for review at that time.  I have not independently reviewed these images which were  unavailable at the time of consultation.  With his history of PVCs he again demonstrates PVCs on ECG today and his metoprolol succinate had been increased to 100 mg a day, however this medication was subsequently decreased back to what the patient is taking today 50 mg.  He has an AVR for BAV with regurgitation performed in 2014.  His primary care physician is Dr. Malen Gauze in [facility].  Past Medical History:  Diagnosis Date  . Anxiety   . Asthma   . Bipolar 1 disorder ([place])   . Complication of anesthesia    woke up during colonoscoy  . Coronary artery disease   . Depression   . Heart disease   . Hypertension   . Mental disorder   . Tachycardia     Past Surgical History:  Procedure Laterality Date  . BONE TUMOR EXCISION     skull  . CARDIAC CATHETERIZATION    . GANGLION CYST EXCISION Right 09/03/2018   Procedure: REMOVAL GANGLION OF RIGHT WRIST AND RIGHT ELBOW;  Surgeon: Leandrew Koyanagi, MD;  Location: [place];  Service: Orthopedics;  Laterality: Right;  OTHER SITE IS ELBOW  . MECHANICAL AORTIC VALVE REPLACEMENT      Current Medications: Current Meds  Medication Sig  . albuterol (PROVENTIL HFA;VENTOLIN HFA) 108 (90 Base) MCG/ACT inhaler INHALE 1-2 PUFFS BY MOUTH EVERY 4-6 HOURS AS NEEDED AND AS DIRECTED  . alprazolam (XANAX) 2 MG tablet Take 2 mg by mouth 4 (four)  times daily as needed for sleep.  Marland Kitchen amphetamine-dextroamphetamine (ADDERALL XR) 25 MG 24 hr capsule Take 25 mg by mouth 2 (two) times daily.  . Asenapine Maleate (SAPHRIS) 10 MG SUBL Place 30 mg under the tongue at bedtime.  Marland Kitchen azelastine (ASTELIN) 0.1 % nasal spray Place 1 spray into both nostrils 2 (two) times daily. Use in each nostril as directed  . cetirizine (ZYRTEC) 10 MG chewable tablet Chew 10 mg by mouth daily.  . metoprolol succinate (TOPROL-XL) 50 MG 24 hr tablet Take 50 mg by mouth daily. Take with or immediately following a meal.   . warfarin (COUMADIN) 5 MG tablet Take 1 tablet (5 mg  total) by mouth daily.     Allergies:   Molds & smuts; Valium [diazepam]; and Vicodin [hydrocodone-acetaminophen]   Social History   Socioeconomic History  . Marital status: Legally Separated    Spouse name: Not on file  . Number of children: Not on file  . Years of education: Not on file  . Highest education level: Not on file  Occupational History  . Not on file  Social Needs  . Financial resource strain: Not on file  . Food insecurity:    Worry: Not on file    Inability: Not on file  . Transportation needs:    Medical: Not on file    Non-medical: Not on file  Tobacco Use  . Smoking status: Current Every Day Smoker    Packs/day: 1.00    Years: 15.00    Pack years: 15.00    Types: Cigarettes  . Smokeless tobacco: Never Used  Substance and Sexual Activity  . Alcohol use: Yes  . Drug use: No  . Sexual activity: Not on file  Lifestyle  . Physical activity:    Days per week: Not on file    Minutes per session: Not on file  . Stress: Not on file  Relationships  . Social connections:    Talks on phone: Not on file    Gets together: Not on file    Attends religious service: Not on file    Active member of club or organization: Not on file    Attends meetings of clubs or organizations: Not on file    Relationship status: Not on file  Other Topics Concern  . Not on file  Social History Narrative  . Not on file     Family History: The patient's family history includes Cancer in his mother; Emphysema in his father.  ROS:   Please see the history of present illness.    All other systems reviewed and are negative.  EKGs/Labs/Other Studies Reviewed:    The following studies were reviewed today:  EKG:  EKG is ordered today.  The ekg ordered today demonstrates SR with frequent PVCs and T wave abnormality in the inferior leads  Recent Labs: 08/05/2018: Hemoglobin 17.1; Platelets 234.0  Recent Lipid Panel No results found for: CHOL, TRIG, HDL, CHOLHDL, VLDL,  LDLCALC, LDLDIRECT  Physical Exam:    VS:  BP 122/74   Pulse 81   Ht 5\' 7"  (1.702 m)   Wt 193 lb (87.5 kg)   BMI 30.23 kg/m     Wt Readings from Last 3 Encounters:  09/03/18 190 lb 14.7 oz (86.6 kg)  08/28/18 193 lb (87.5 kg)  08/05/18 193 lb (87.5 kg)     Constitutional: No acute distress Eyes: pupils equally round and reactive to light, sclera non-icteric, normal conjunctiva and lids ENMT: normal dentition, moist mucous  membranes Cardiovascular: regular rhythm, normal rate, no murmurs.  Crisp mechanical valve closure sound at S2, normal S1.  Radial pulses normal bilaterally. No jugular venous distention.  Respiratory: clear to auscultation bilaterally GI : normal bowel sounds, soft and nontender. No distention.   MSK: extremities warm, well perfused. No edema.  NEURO: grossly nonfocal exam, moves all extremities. PSYCH: alert and oriented x 3, very anxious, talkative  ASSESSMENT:    1. Status post aortic valve replacement   2. Ganglion cyst of volar aspect of right wrist   3. Schizoaffective disorder, bipolar type ([place])   4. Bicuspid aortic valve   5. SVT (supraventricular tachycardia) ([place])   6. PVC's (premature ventricular contractions)    PLAN:    Bicuspid aortic valve with regurgitation s/p mechanical AVR - I have attempted to provide counseling on first degree family member screening for both BAV and aorta dilatation, however the patient had other questions about his extra-cardiac health and was not inclined to participate in a discussion on family screening.  We will address this again at our next appointment.  He recently had an echocardiogram and the report was relatively unremarkable for acute findings.  He would like for me to independently reviewed the images from this echocardiogram, so we will request a disc from the [facility] system.  I have informed the patient that the report was satisfactory and we can likely repeat our own echocardiogram in 1  years time if necessary.  I would certainly be happy to review any images as they become available.  He continues to have PVCs but is not inclined to change medication therapy today, I will see him back in 3 months and we will reassess at that time.  The patient was fixated on measurements from his echocardiogram, but described measurements and numbers that were not accurate anatomically or for the study.  I have encouraged him to focus on the overall cardiovascular picture and to not be overly concerned with variable measurements as there is variability from study to study.  I have assured him that we will meticulously review the gradients of his mechanical aortic prosthesis, the measurement of his acsending aorta, and his left ventricular size so as not to miss any important changes in dimensions or gradients that could impact his care.  I answered Mr. Matarese's varied questions to the best of my ability.  I will see him back in 3 months.   Greater than 60 minutes was spent in review of the record, review of testing, and medication review.  40 minutes of that time was spent in direct face-to-face patient care.   Medication Adjustments/Labs and Tests Ordered: Current medicines are reviewed at length with the patient today.  Concerns regarding medicines are outlined above.  Orders Placed This Encounter  Procedures  . EKG 12-Lead   No orders of the defined types were placed in this encounter.   Patient Instructions  Medication Instructions:  Your physician recommends that you continue on your current medications as directed. Please refer to the Current Medication list given to you today.  If you need a refill on your cardiac medications before your next appointment, please call your pharmacy.   Lab work: None ordered   Testing/Procedures: None ordered  Follow-Up: At [facility], you and your health needs are our priority.  As part of our continuing mission to provide you with  exceptional heart care, we have created designated Provider Care Teams.  These Care Teams include your primary Cardiologist (physician)  and Advanced Practice Providers (APPs -  Physician Assistants and Nurse Practitioners) who all work together to provide you with the care you need, when you need it. You will need a follow up appointment in 3 months.    You may see  Dr. Margaretann Loveless  or one of the following Advanced Practice Providers on your designated Care Team:   Rosaria Ferries, PA-C . Jory Sims, DNP, ANP  Any Other Special Instructions Will Be Listed Below (If Applicable). We will request a copy of your recent echocardiogram images       Signed, Elouise Munroe, MD  09/16/2018 9:34 PM    [place]

## 2018-08-28 NOTE — Telephone Encounter (Signed)
ROI form signed by patient and given to medical records to request a CD with images of the patients most recent echocardiogram/tee from [facility] ([place]).

## 2018-08-28 NOTE — Telephone Encounter (Signed)
Echo images requested from [facility] 08/28/18

## 2018-08-28 NOTE — Patient Instructions (Signed)
Medication Instructions:  Your physician recommends that you continue on your current medications as directed. Please refer to the Current Medication list given to you today.  If you need a refill on your cardiac medications before your next appointment, please call your pharmacy.   Lab work: None ordered   Testing/Procedures: None ordered  Follow-Up: At [facility], you and your health needs are our priority.  As part of our continuing mission to provide you with exceptional heart care, we have created designated Provider Care Teams.  These Care Teams include your primary Cardiologist (physician) and Advanced Practice Providers (APPs -  Physician Assistants and Nurse Practitioners) who all work together to provide you with the care you need, when you need it. You will need a follow up appointment in 3 months.    You may see  Dr. Margaretann Loveless  or one of the following Advanced Practice Providers on your designated Care Team:   Rosaria Ferries, PA-C . Jory Sims, DNP, ANP  Any Other Special Instructions Will Be Listed Below (If Applicable). We will request a copy of your recent echocardiogram images

## 2018-08-29 ENCOUNTER — Other Ambulatory Visit: Payer: Self-pay

## 2018-08-29 ENCOUNTER — Encounter (HOSPITAL_BASED_OUTPATIENT_CLINIC_OR_DEPARTMENT_OTHER): Payer: Self-pay | Admitting: *Deleted

## 2018-08-29 NOTE — Progress Notes (Signed)
Card notes, echo and clearance reviewed with Dr Smith Robert. Scobey for surgery 09/03/18.

## 2018-09-02 ENCOUNTER — Telehealth: Payer: Self-pay | Admitting: Internal Medicine

## 2018-09-02 ENCOUNTER — Encounter (HOSPITAL_BASED_OUTPATIENT_CLINIC_OR_DEPARTMENT_OTHER)
Admission: RE | Admit: 2018-09-02 | Discharge: 2018-09-02 | Disposition: A | Discharge status: Home / Self Care | Payer: Medicare Other | Source: Ambulatory Visit | Attending: Orthopaedic Surgery | Admitting: Orthopaedic Surgery

## 2018-09-02 DIAGNOSIS — F419 Anxiety disorder, unspecified: Secondary | ICD-10-CM | POA: Diagnosis not present

## 2018-09-02 DIAGNOSIS — I10 Essential (primary) hypertension: Secondary | ICD-10-CM | POA: Diagnosis not present

## 2018-09-02 DIAGNOSIS — F319 Bipolar disorder, unspecified: Secondary | ICD-10-CM | POA: Diagnosis not present

## 2018-09-02 DIAGNOSIS — Z952 Presence of prosthetic heart valve: Secondary | ICD-10-CM | POA: Diagnosis not present

## 2018-09-02 DIAGNOSIS — J45909 Unspecified asthma, uncomplicated: Secondary | ICD-10-CM | POA: Diagnosis not present

## 2018-09-02 DIAGNOSIS — M67431 Ganglion, right wrist: Secondary | ICD-10-CM | POA: Diagnosis not present

## 2018-09-02 DIAGNOSIS — F209 Schizophrenia, unspecified: Secondary | ICD-10-CM | POA: Diagnosis not present

## 2018-09-02 DIAGNOSIS — I2510 Atherosclerotic heart disease of native coronary artery without angina pectoris: Secondary | ICD-10-CM | POA: Diagnosis not present

## 2018-09-02 DIAGNOSIS — L720 Epidermal cyst: Secondary | ICD-10-CM | POA: Diagnosis not present

## 2018-09-02 DIAGNOSIS — Z79899 Other long term (current) drug therapy: Secondary | ICD-10-CM | POA: Diagnosis not present

## 2018-09-02 DIAGNOSIS — Z7901 Long term (current) use of anticoagulants: Secondary | ICD-10-CM | POA: Diagnosis not present

## 2018-09-02 DIAGNOSIS — F17210 Nicotine dependence, cigarettes, uncomplicated: Secondary | ICD-10-CM | POA: Diagnosis not present

## 2018-09-02 DIAGNOSIS — M71821 Other specified bursopathies, right elbow: Secondary | ICD-10-CM | POA: Diagnosis not present

## 2018-09-02 LAB — PROTIME-INR
INR: 1.27
PROTHROMBIN TIME: 15.8 s — AB (ref 11.4–15.2)

## 2018-09-02 NOTE — Telephone Encounter (Signed)
Received records from [facility] on 09/02/18, Appt 11/27/18 @ 9:20AM.[place]

## 2018-09-03 ENCOUNTER — Other Ambulatory Visit: Payer: Self-pay

## 2018-09-03 ENCOUNTER — Ambulatory Visit (HOSPITAL_BASED_OUTPATIENT_CLINIC_OR_DEPARTMENT_OTHER): Payer: Medicare Other | Admitting: Anesthesiology

## 2018-09-03 ENCOUNTER — Encounter (HOSPITAL_BASED_OUTPATIENT_CLINIC_OR_DEPARTMENT_OTHER)
Admission: RE | Disposition: A | Discharge status: Home / Self Care | Payer: Self-pay | Source: Ambulatory Visit | Attending: Orthopaedic Surgery

## 2018-09-03 ENCOUNTER — Ambulatory Visit (HOSPITAL_BASED_OUTPATIENT_CLINIC_OR_DEPARTMENT_OTHER)
Admission: RE | Admit: 2018-09-03 | Discharge: 2018-09-03 | Disposition: A | Discharge status: Home / Self Care | Payer: Medicare Other | Source: Ambulatory Visit | Attending: Orthopaedic Surgery | Admitting: Orthopaedic Surgery

## 2018-09-03 ENCOUNTER — Encounter (HOSPITAL_BASED_OUTPATIENT_CLINIC_OR_DEPARTMENT_OTHER): Payer: Self-pay | Admitting: Anesthesiology

## 2018-09-03 DIAGNOSIS — L720 Epidermal cyst: Secondary | ICD-10-CM | POA: Insufficient documentation

## 2018-09-03 DIAGNOSIS — M71821 Other specified bursopathies, right elbow: Secondary | ICD-10-CM | POA: Diagnosis not present

## 2018-09-03 DIAGNOSIS — M6740 Ganglion, unspecified site: Secondary | ICD-10-CM

## 2018-09-03 DIAGNOSIS — Z79899 Other long term (current) drug therapy: Secondary | ICD-10-CM | POA: Insufficient documentation

## 2018-09-03 DIAGNOSIS — J45909 Unspecified asthma, uncomplicated: Secondary | ICD-10-CM | POA: Insufficient documentation

## 2018-09-03 DIAGNOSIS — M67431 Ganglion, right wrist: Secondary | ICD-10-CM

## 2018-09-03 DIAGNOSIS — Z952 Presence of prosthetic heart valve: Secondary | ICD-10-CM | POA: Insufficient documentation

## 2018-09-03 DIAGNOSIS — F419 Anxiety disorder, unspecified: Secondary | ICD-10-CM | POA: Insufficient documentation

## 2018-09-03 DIAGNOSIS — F319 Bipolar disorder, unspecified: Secondary | ICD-10-CM | POA: Insufficient documentation

## 2018-09-03 DIAGNOSIS — I10 Essential (primary) hypertension: Secondary | ICD-10-CM | POA: Insufficient documentation

## 2018-09-03 DIAGNOSIS — M71321 Other bursal cyst, right elbow: Secondary | ICD-10-CM

## 2018-09-03 DIAGNOSIS — Z7901 Long term (current) use of anticoagulants: Secondary | ICD-10-CM | POA: Insufficient documentation

## 2018-09-03 DIAGNOSIS — F209 Schizophrenia, unspecified: Secondary | ICD-10-CM | POA: Insufficient documentation

## 2018-09-03 DIAGNOSIS — F17210 Nicotine dependence, cigarettes, uncomplicated: Secondary | ICD-10-CM | POA: Insufficient documentation

## 2018-09-03 DIAGNOSIS — I2510 Atherosclerotic heart disease of native coronary artery without angina pectoris: Secondary | ICD-10-CM | POA: Insufficient documentation

## 2018-09-03 HISTORY — DX: Other complications of anesthesia, initial encounter: T88.59XA

## 2018-09-03 HISTORY — PX: GANGLION CYST EXCISION: SHX1691

## 2018-09-03 HISTORY — DX: Adverse effect of unspecified anesthetic, initial encounter: T41.45XA

## 2018-09-03 SURGERY — EXCISION, GANGLION CYST, WRIST
Anesthesia: General | Site: Wrist | Laterality: Right

## 2018-09-03 MED ORDER — OXYCODONE HCL 5 MG PO TABS
5.0000 mg | ORAL_TABLET | Freq: Once | ORAL | Status: AC | PRN
  Administered 2018-09-03: 5 mg via ORAL

## 2018-09-03 MED ORDER — HYDROMORPHONE HCL 1 MG/ML IJ SOLN
INTRAMUSCULAR | Status: AC
  Filled 2018-09-03: qty 0.5

## 2018-09-03 MED ORDER — CHLORHEXIDINE GLUCONATE 4 % EX LIQD: 60.0000 mL | Freq: Once | CUTANEOUS | Status: DC

## 2018-09-03 MED ORDER — PROMETHAZINE HCL 25 MG/ML IJ SOLN: 6.2500 mg | INTRAMUSCULAR | Status: DC | PRN

## 2018-09-03 MED ORDER — CEFAZOLIN SODIUM-DEXTROSE 2-4 GM/100ML-% IV SOLN
2.0000 g | INTRAVENOUS | Status: AC
  Administered 2018-09-03: 2 g via INTRAVENOUS

## 2018-09-03 MED ORDER — MEPERIDINE HCL 25 MG/ML IJ SOLN: 6.2500 mg | INTRAMUSCULAR | Status: DC | PRN

## 2018-09-03 MED ORDER — OXYCODONE HCL 5 MG PO TABS
ORAL_TABLET | ORAL | Status: AC
  Filled 2018-09-03: qty 1

## 2018-09-03 MED ORDER — PROPOFOL 10 MG/ML IV BOLUS
INTRAVENOUS | Status: AC
  Filled 2018-09-03: qty 20

## 2018-09-03 MED ORDER — LIDOCAINE HCL (CARDIAC) PF 100 MG/5ML IV SOSY
PREFILLED_SYRINGE | INTRAVENOUS | Status: DC | PRN
  Administered 2018-09-03: 100 mg via INTRAVENOUS

## 2018-09-03 MED ORDER — CEFAZOLIN SODIUM-DEXTROSE 2-4 GM/100ML-% IV SOLN
INTRAVENOUS | Status: AC
  Filled 2018-09-03: qty 100

## 2018-09-03 MED ORDER — PROPOFOL 500 MG/50ML IV EMUL
INTRAVENOUS | Status: AC
  Filled 2018-09-03: qty 50

## 2018-09-03 MED ORDER — FENTANYL CITRATE (PF) 100 MCG/2ML IJ SOLN
INTRAMUSCULAR | Status: AC
  Filled 2018-09-03: qty 2

## 2018-09-03 MED ORDER — PROPOFOL 10 MG/ML IV BOLUS
INTRAVENOUS | Status: DC | PRN
  Administered 2018-09-03: 300 mg via INTRAVENOUS

## 2018-09-03 MED ORDER — FENTANYL CITRATE (PF) 100 MCG/2ML IJ SOLN
50.0000 ug | INTRAMUSCULAR | Status: DC | PRN
  Administered 2018-09-03: 100 ug via INTRAVENOUS

## 2018-09-03 MED ORDER — OXYCODONE HCL 5 MG/5ML PO SOLN: 5.0000 mg | Freq: Once | ORAL | Status: AC | PRN

## 2018-09-03 MED ORDER — SCOPOLAMINE 1 MG/3DAYS TD PT72: 1.0000 | MEDICATED_PATCH | Freq: Once | TRANSDERMAL | Status: DC | PRN

## 2018-09-03 MED ORDER — OXYCODONE-ACETAMINOPHEN 5-325 MG PO TABS: 1.0000 | ORAL_TABLET | Freq: Two times a day (BID) | ORAL | 0 refills | Status: DC | PRN

## 2018-09-03 MED ORDER — MIDAZOLAM HCL 2 MG/2ML IJ SOLN
INTRAMUSCULAR | Status: AC
  Filled 2018-09-03: qty 2

## 2018-09-03 MED ORDER — ONDANSETRON HCL 4 MG/2ML IJ SOLN
INTRAMUSCULAR | Status: AC
  Filled 2018-09-03: qty 2

## 2018-09-03 MED ORDER — LACTATED RINGERS IV SOLN
INTRAVENOUS | Status: DC
  Administered 2018-09-03: 08:00:00 via INTRAVENOUS

## 2018-09-03 MED ORDER — MIDAZOLAM HCL 2 MG/2ML IJ SOLN
1.0000 mg | INTRAMUSCULAR | Status: DC | PRN
  Administered 2018-09-03: 2 mg via INTRAVENOUS

## 2018-09-03 MED ORDER — ONDANSETRON HCL 4 MG PO TABS: 4.0000 mg | ORAL_TABLET | Freq: Three times a day (TID) | ORAL | 0 refills | Status: DC | PRN

## 2018-09-03 MED ORDER — ONDANSETRON HCL 4 MG/2ML IJ SOLN
INTRAMUSCULAR | Status: DC | PRN
  Administered 2018-09-03: 4 mg via INTRAVENOUS

## 2018-09-03 MED ORDER — LACTATED RINGERS IV SOLN: INTRAVENOUS | Status: DC

## 2018-09-03 MED ORDER — DEXAMETHASONE SODIUM PHOSPHATE 10 MG/ML IJ SOLN
INTRAMUSCULAR | Status: DC | PRN
  Administered 2018-09-03: 10 mg via INTRAVENOUS

## 2018-09-03 MED ORDER — HYDROMORPHONE HCL 1 MG/ML IJ SOLN
0.2500 mg | INTRAMUSCULAR | Status: DC | PRN
  Administered 2018-09-03: 0.5 mg via INTRAVENOUS

## 2018-09-03 MED ORDER — BUPIVACAINE HCL (PF) 0.25 % IJ SOLN
INTRAMUSCULAR | Status: DC | PRN
  Administered 2018-09-03: 10 mL

## 2018-09-03 SURGICAL SUPPLY — 62 items
BANDAGE ACE 3X5.8 VEL STRL LF (GAUZE/BANDAGES/DRESSINGS) ×3 IMPLANT
BANDAGE ACE 4X5 VEL STRL LF (GAUZE/BANDAGES/DRESSINGS) ×2 IMPLANT
BLADE HEX COATED 2.75 (ELECTRODE) IMPLANT
BLADE SURG 15 STRL LF DISP TIS (BLADE) ×2 IMPLANT
BLADE SURG 15 STRL SS (BLADE) ×6
BNDG CMPR 9X4 STRL LF SNTH (GAUZE/BANDAGES/DRESSINGS) ×1
BNDG ESMARK 4X9 LF (GAUZE/BANDAGES/DRESSINGS) ×3 IMPLANT
BRUSH SCRUB EZ PLAIN DRY (MISCELLANEOUS) ×3 IMPLANT
CANISTER SUCT 1200ML W/VALVE (MISCELLANEOUS) IMPLANT
CORD BIPOLAR FORCEPS 12FT (ELECTRODE) ×3 IMPLANT
COVER BACK TABLE 60X90IN (DRAPES) ×3 IMPLANT
COVER WAND RF STERILE (DRAPES) IMPLANT
CUFF TOURN SGL LL 18 NRW (TOURNIQUET CUFF) ×2 IMPLANT
CUFF TOURNIQUET SINGLE 18IN (TOURNIQUET CUFF) IMPLANT
DECANTER SPIKE VIAL GLASS SM (MISCELLANEOUS) ×2 IMPLANT
DRAPE EXTREMITY T 121X128X90 (DRAPE) ×3 IMPLANT
DRAPE IMP U-DRAPE 54X76 (DRAPES) ×3 IMPLANT
DRAPE SURG 17X23 STRL (DRAPES) ×3 IMPLANT
ELECT REM PT RETURN 9FT ADLT (ELECTROSURGICAL)
ELECTRODE REM PT RTRN 9FT ADLT (ELECTROSURGICAL) IMPLANT
GAUZE 4X4 16PLY RFD (DISPOSABLE) IMPLANT
GAUZE SPONGE 4X4 12PLY STRL (GAUZE/BANDAGES/DRESSINGS) ×3 IMPLANT
GAUZE XEROFORM 1X8 LF (GAUZE/BANDAGES/DRESSINGS) ×3 IMPLANT
GLOVE BIOGEL PI IND STRL 7.0 (GLOVE) ×1 IMPLANT
GLOVE BIOGEL PI INDICATOR 7.0 (GLOVE) ×6
GLOVE ECLIPSE 6.5 STRL STRAW (GLOVE) ×3 IMPLANT
GLOVE ECLIPSE 7.0 STRL STRAW (GLOVE) ×3 IMPLANT
GLOVE SKINSENSE NS SZ7.5 (GLOVE) ×2
GLOVE SKINSENSE STRL SZ7.5 (GLOVE) ×1 IMPLANT
GLOVE SURG SYN 7.5  E (GLOVE) ×2
GLOVE SURG SYN 7.5 E (GLOVE) ×1 IMPLANT
GOWN STRL REIN XL XLG (GOWN DISPOSABLE) ×3 IMPLANT
GOWN STRL REUS W/ TWL LRG LVL3 (GOWN DISPOSABLE) ×1 IMPLANT
GOWN STRL REUS W/ TWL XL LVL3 (GOWN DISPOSABLE) ×1 IMPLANT
GOWN STRL REUS W/TWL LRG LVL3 (GOWN DISPOSABLE) ×3
GOWN STRL REUS W/TWL XL LVL3 (GOWN DISPOSABLE) ×3
NEEDLE HYPO 25X1 1.5 SAFETY (NEEDLE) IMPLANT
NS IRRIG 1000ML POUR BTL (IV SOLUTION) ×2 IMPLANT
PACK BASIN DAY SURGERY FS (CUSTOM PROCEDURE TRAY) ×3 IMPLANT
PAD CAST 3X4 CTTN HI CHSV (CAST SUPPLIES) ×1 IMPLANT
PAD CAST 4YDX4 CTTN HI CHSV (CAST SUPPLIES) IMPLANT
PADDING CAST COTTON 3X4 STRL (CAST SUPPLIES) ×3
PADDING CAST COTTON 4X4 STRL (CAST SUPPLIES) ×3
PENCIL BUTTON HOLSTER BLD 10FT (ELECTRODE) IMPLANT
RUBBERBAND STERILE (MISCELLANEOUS) ×2 IMPLANT
SLEEVE SCD COMPRESS KNEE MED (MISCELLANEOUS) ×3 IMPLANT
SPLINT UNIVERSAL RIGHT (SOFTGOODS) IMPLANT
SPLINT WRIST 10 LEFT UNV (SOFTGOODS) IMPLANT
SPONGE LAP 18X18 RF (DISPOSABLE) IMPLANT
STOCKINETTE 4X48 STRL (DRAPES) ×3 IMPLANT
SUCTION FRAZIER HANDLE 10FR (MISCELLANEOUS)
SUCTION TUBE FRAZIER 10FR DISP (MISCELLANEOUS) IMPLANT
SUT ETHILON 4 0 PS 2 18 (SUTURE) ×3 IMPLANT
SUT VIC AB 0 CT1 27 (SUTURE)
SUT VIC AB 0 CT1 27XBRD ANBCTR (SUTURE) IMPLANT
SUT VIC AB 2-0 CT1 27 (SUTURE)
SUT VIC AB 2-0 CT1 TAPERPNT 27 (SUTURE) IMPLANT
SUT VICRYL 3-0 RB1 18 ABS (SUTURE) ×2 IMPLANT
SYR BULB 3OZ (MISCELLANEOUS) ×3 IMPLANT
SYR CONTROL 10ML LL (SYRINGE) IMPLANT
TOWEL GREEN STERILE FF (TOWEL DISPOSABLE) ×3 IMPLANT
TOWEL OR NON WOVEN STRL DISP B (DISPOSABLE) ×3 IMPLANT

## 2018-09-03 NOTE — Anesthesia Preprocedure Evaluation (Addendum)
Anesthesia Evaluation  Patient identified by MRN, date of birth, ID band Patient awake    Reviewed: Allergy & Precautions, NPO status , Patient's Chart, lab work & pertinent test results, reviewed documented beta blocker date and time   Airway Mallampati: II  TM Distance: >3 FB Neck ROM: Full    Dental no notable dental hx.    Pulmonary asthma , Current Smoker,    Pulmonary exam normal breath sounds clear to auscultation       Cardiovascular hypertension, Pt. on medications and Pt. on home beta blockers + CAD  Normal cardiovascular exam+ Valvular Problems/Murmurs  Rhythm:Regular Rate:Normal     Neuro/Psych PSYCHIATRIC DISORDERS Anxiety Depression Bipolar Disorder Schizophrenia negative neurological ROS     GI/Hepatic negative GI ROS, Neg liver ROS,   Endo/Other  negative endocrine ROS  Renal/GU negative Renal ROS  negative genitourinary   Musculoskeletal negative musculoskeletal ROS (+)   Abdominal   Peds negative pediatric ROS (+)  Hematology negative hematology ROS (+)   Anesthesia Other Findings   Reproductive/Obstetrics negative OB ROS                            Anesthesia Physical Anesthesia Plan  ASA: III  Anesthesia Plan: General   Post-op Pain Management:    Induction: Intravenous  PONV Risk Score and Plan: 1 and Ondansetron  Airway Management Planned: LMA  Additional Equipment:   Intra-op Plan:   Post-operative Plan: Extubation in OR  Informed Consent: I have reviewed the patients History and Physical, chart, labs and discussed the procedure including the risks, benefits and alternatives for the proposed anesthesia with the patient or authorized representative who has indicated his/her understanding and acceptance.   Dental advisory given  Plan Discussed with: CRNA  Anesthesia Plan Comments:         Anesthesia Quick Evaluation

## 2018-09-03 NOTE — Anesthesia Procedure Notes (Signed)
Procedure Name: LMA Insertion Date/Time: 09/03/2018 8:45 AM Performed by: Maryella Shivers, CRNA Pre-anesthesia Checklist: Patient identified, Emergency Drugs available, Suction available and Patient being monitored Patient Re-evaluated:Patient Re-evaluated prior to induction Oxygen Delivery Method: Circle system utilized Preoxygenation: Pre-oxygenation with 100% oxygen Induction Type: IV induction Ventilation: Mask ventilation without difficulty LMA: LMA inserted LMA Size: 5.0 Number of attempts: 1 Airway Equipment and Method: Bite block Placement Confirmation: positive ETCO2 Tube secured with: Tape Dental Injury: Teeth and Oropharynx as per pre-operative assessment

## 2018-09-03 NOTE — H&P (Signed)
PREOPERATIVE H&P  Chief Complaint: right wrist and elbow ganglion cyst  HPI: Max Ward is a 47 y.o. male who presents for surgical treatment of right wrist and elbow ganglion cyst.  He denies any changes in medical history.  Past Medical History:  Diagnosis Date  . Anxiety   . Asthma   . Bipolar 1 disorder ([place])   . Complication of anesthesia    woke up during colonoscoy  . Coronary artery disease   . Depression   . Heart disease   . Hypertension   . Mental disorder   . Tachycardia    Past Surgical History:  Procedure Laterality Date  . BONE TUMOR EXCISION     skull  . CARDIAC CATHETERIZATION    . MECHANICAL AORTIC VALVE REPLACEMENT     Social History   Socioeconomic History  . Marital status: Legally Separated    Spouse name: Not on file  . Number of children: Not on file  . Years of education: Not on file  . Highest education level: Not on file  Occupational History  . Not on file  Social Needs  . Financial resource strain: Not on file  . Food insecurity:    Worry: Not on file    Inability: Not on file  . Transportation needs:    Medical: Not on file    Non-medical: Not on file  Tobacco Use  . Smoking status: Current Every Day Smoker    Packs/day: 1.00    Years: 15.00    Pack years: 15.00    Types: Cigarettes  . Smokeless tobacco: Never Used  Substance and Sexual Activity  . Alcohol use: Yes  . Drug use: No  . Sexual activity: Not on file  Lifestyle  . Physical activity:    Days per week: Not on file    Minutes per session: Not on file  . Stress: Not on file  Relationships  . Social connections:    Talks on phone: Not on file    Gets together: Not on file    Attends religious service: Not on file    Active member of club or organization: Not on file    Attends meetings of clubs or organizations: Not on file    Relationship status: Not on file  Other Topics Concern  . Not on file  Social History Narrative  . Not on file    Family History  Problem Relation Age of Onset  . Cancer Mother   . Emphysema Father    Allergies  Allergen Reactions  . Molds & Smuts Other (See Comments)    Unknown  . Valium [Diazepam] Palpitations  . Vicodin [Hydrocodone-Acetaminophen] Palpitations   Prior to Admission medications   Medication Sig Start Date End Date Taking? Authorizing Provider  albuterol (PROVENTIL HFA;VENTOLIN HFA) 108 (90 Base) MCG/ACT inhaler INHALE 1-2 PUFFS BY MOUTH EVERY 4-6 HOURS AS NEEDED AND AS DIRECTED 08/21/18  Yes Olalere, Adewale A, MD  alprazolam (XANAX) 2 MG tablet Take 2 mg by mouth 4 (four) times daily as needed for sleep.   Yes [provider]  amphetamine-dextroamphetamine (ADDERALL XR) 25 MG 24 hr capsule Take 25 mg by mouth 2 (two) times daily.   Yes [provider]  Asenapine Maleate (SAPHRIS) 10 MG SUBL Place 30 mg under the tongue at bedtime.   Yes [provider]  azelastine (ASTELIN) 0.1 % nasal spray Place 1 spray into both nostrils 2 (two) times daily. Use in each nostril as directed  Yes [provider]  cetirizine (ZYRTEC) 10 MG chewable tablet Chew 10 mg by mouth daily.   Yes [provider]  metoprolol succinate (TOPROL-XL) 50 MG 24 hr tablet Take 50 mg by mouth daily. Take with or immediately following a meal.    Yes [provider]  warfarin (COUMADIN) 5 MG tablet Take 1 tablet (5 mg total) by mouth daily. 09/27/14  Yes Ward, Delice Bison, DO  traZODone (DESYREL) 50 MG tablet Take 1 tablet (50 mg total) by mouth at bedtime as needed for sleep. For sleep Patient taking differently: Take 100 mg by mouth at bedtime as needed for sleep.  01/25/12 03/10/17  Greig Castilla, FNP     Positive ROS: All other systems have been reviewed and were otherwise negative with the exception of those mentioned in the HPI and as above.  Physical Exam: General: Alert, no acute distress Cardiovascular: No pedal edema Respiratory: No cyanosis, no  use of accessory musculature GI: abdomen soft Skin: No lesions in the area of chief complaint Neurologic: Sensation intact distally Psychiatric: Patient is competent for consent with normal mood and affect Lymphatic: no lymphedema  MUSCULOSKELETAL: exam stable  Assessment: right wrist ganglion cyst  Plan: Plan for Procedure(s): REMOVAL GANGLION OF RIGHT WRIST AND RIGHT ELBOW  The risks benefits and alternatives were discussed with the patient including but not limited to the risks of nonoperative treatment, versus surgical intervention including infection, bleeding, nerve injury,  blood clots, cardiopulmonary complications, morbidity, mortality, among others, and they were willing to proceed.   Eduard Roux, MD   09/03/2018 8:29 AM

## 2018-09-03 NOTE — Transfer of Care (Signed)
Immediate Anesthesia Transfer of Care Note  Patient: Max Ward  Procedure(s) Performed: REMOVAL GANGLION OF RIGHT WRIST AND RIGHT ELBOW (Right Wrist)  Patient Location: PACU  Anesthesia Type:General  Level of Consciousness: sedated  Airway & Oxygen Therapy: Patient Spontanous Breathing and Patient connected to face mask oxygen  Post-op Assessment: Report given to RN and Post -op Vital signs reviewed and stable  Post vital signs: Reviewed and stable  Last Vitals:  Vitals Value Taken Time  BP 128/82 09/03/2018  9:48 AM  Temp    Pulse 75 09/03/2018  9:51 AM  Resp 11 09/03/2018  9:51 AM  SpO2 99 % 09/03/2018  9:51 AM  Vitals shown include unvalidated device data.  Last Pain:  Vitals:   09/03/18 0751  TempSrc: Oral  PainSc: 0-No pain      Patients Stated Pain Goal: 3 (78/24/23 5361)  Complications: No apparent anesthesia complications

## 2018-09-03 NOTE — Discharge Instructions (Signed)
GAVE OXYCODONE 5 MG TAB AT 10:30 am today!      Postoperative instructions:  Weightbearing instructions: as tolerated.  No heavy lifting  Dressing instructions: Keep your dressing and/or splint clean and dry at all times.  It will be removed at your first post-operative appointment.  Your stitches and/or staples will be removed at this visit.  Incision instructions:  Do not soak your incision for 3 weeks after surgery.  If the incision gets wet, pat dry and do not scrub the incision.  Pain control:  You have been given a prescription to be taken as directed for post-operative pain control.  In addition, elevate the operative extremity above the heart at all times to prevent swelling and throbbing pain.  Take over-the-counter Colace, 100mg  by mouth twice a day while taking narcotic pain medications to help prevent constipation.  Follow up appointments: 1) 12-14 days for suture removal and wound check. 2) Dr. Erlinda Hong as scheduled.   -------------------------------------------------------------------------------------------------------------  After Surgery Pain Control:  After your surgery, post-surgical discomfort or pain is likely. This discomfort can last several days to a few weeks. At certain times of the day your discomfort may be more intense.  Did you receive a nerve block?  A nerve block can provide pain relief for one hour to two days after your surgery. As long as the nerve block is working, you will experience little or no sensation in the area the surgeon operated on.  As the nerve block wears off, you will begin to experience pain or discomfort. It is very important that you begin taking your prescribed pain medication before the nerve block fully wears off. Treating your pain at the first sign of the block wearing off will ensure your pain is better controlled and more tolerable when full-sensation returns. Do not wait until the pain is intolerable, as the medicine will be  less effective. It is better to treat pain in advance than to try and catch up.  General Anesthesia:  If you did not receive a nerve block during your surgery, you will need to start taking your pain medication shortly after your surgery and should continue to do so as prescribed by your surgeon.  Pain Medication:  Most commonly we prescribe Vicodin and Percocet for post-operative pain. Both of these medications contain a combination of acetaminophen (Tylenol) and a narcotic to help control pain.   It takes between 30 and 45 minutes before pain medication starts to work. It is important to take your medication before your pain level gets too intense.   Nausea is a common side effect of many pain medications. You will want to eat something before taking your pain medicine to help prevent nausea.   If you are taking a prescription pain medication that contains acetaminophen, we recommend that you do not take additional over the counter acetaminophen (Tylenol).  Other pain relieving options:   Using a cold pack to ice the affected area a few times a day (15 to 20 minutes at a time) can help to relieve pain, reduce swelling and bruising.   Elevation of the affected area can also help to reduce pain and swelling.        Post Anesthesia Home Care Instructions  Activity: Get plenty of rest for the remainder of the day. A responsible individual must stay with you for 24 hours following the procedure.  For the next 24 hours, DO NOT: -Drive a car -Paediatric nurse -Drink alcoholic beverages -Take any medication unless  instructed by your physician -Make any legal decisions or sign important papers.  Meals: Start with liquid foods such as gelatin or soup. Progress to regular foods as tolerated. Avoid greasy, spicy, heavy foods. If nausea and/or vomiting occur, drink only clear liquids until the nausea and/or vomiting subsides. Call your physician if vomiting continues.  Special  Instructions/Symptoms: Your throat may feel dry or sore from the anesthesia or the breathing tube placed in your throat during surgery. If this causes discomfort, gargle with warm salt water. The discomfort should disappear within 24 hours.  If you had a scopolamine patch placed behind your ear for the management of post- operative nausea and/or vomiting:  1. The medication in the patch is effective for 72 hours, after which it should be removed.  Wrap patch in a tissue and discard in the trash. Wash hands thoroughly with soap and water. 2. You may remove the patch earlier than 72 hours if you experience unpleasant side effects which may include dry mouth, dizziness or visual disturbances. 3. Avoid touching the patch. Wash your hands with soap and water after contact with the patch.

## 2018-09-03 NOTE — Op Note (Signed)
   Date of Surgery: 09/03/2018  INDICATIONS: Mr. Stuckey is a 47 y.o.-year-old male with a right volar wrist and medial elbow ganglion cysts;  The patient did consent to the procedure after discussion of the risks and benefits.  PREOPERATIVE DIAGNOSIS:  1. Right volar wrist ganglion cyst 2. Right medial elbow ganglion cyst, possible lipoma  POSTOPERATIVE DIAGNOSIS: Same.  PROCEDURE:  1. Removal of right volar wrist ganglion cyst 2. Tenolysis of FCR tendon 3. Removal of right medial elbow ganglion cyst 4.  Removal of right olecranon bursa  SURGEON: N. Eduard Roux, M.D.  ASSIST: Ciro Backer [place], [place]; necessary for the timely completion of procedure and due to complexity of procedure.  ANESTHESIA:  general  IV FLUIDS AND URINE: See anesthesia.  ESTIMATED BLOOD LOSS: minimal mL.  IMPLANTS: none  DRAINS: none  COMPLICATIONS: None.  DESCRIPTION OF PROCEDURE: The patient was brought to the operating room and placed supine on the operating table.  The patient had been signed prior to the procedure and this was documented. The patient had the anesthesia placed by the anesthesiologist.  A time-out was performed to confirm that this was the correct patient, site, side and location. The patient did receive antibiotics prior to the incision and was re-dosed during the procedure as needed at indicated intervals.  A tourniquet was placed.  The patient had the operative extremity prepped and draped in the standard surgical fashion.    I first began with the volar wrist ganglion cyst.  An incision was made directly over the cyst and a curvilinear fashion.  Dissection was carried bluntly through the subcutaneous tissue.  The cyst was identified.  Retractors were placed for visualization.  I continued my dissection circumferentially around the cyst onto the sheath of the FCR tendon.  The cyst was sharply excised off of the FCR tendon.  I was not able to identify an obvious stalk to the cyst.   The cyst was removed entirely and sent for pathology.  I then performed gentle tenolysis of the FCR tendon with Metzenbaum scissors.  There was mild tenosynovitis that was debrided.  I then turned my attention to the medial elbow cyst.  A separate incision was made directly over the cyst.  Dissection was performed bluntly through the subcutaneous tissue.  Retractors were placed.  The cyst was then delivered through the wound.  Circumferential dissection was performed until the cyst was removed entirely.  This was also sent off to pathology.  Overall on gross inspection it appeared like a lipoma.  After this was done I then found a small area of the olecranon bursa on the medial side that looked to be inflamed.  I decided to remove this also.  Both wounds were then thoroughly irrigated.  The tourniquet was deflated and hemostasis was obtained.  Surgical wounds were closed with interrupted 3-0 Vicryl and interrupted 4-0 nylon.  Sterile dressings were applied.  Patient tolerated procedure well had no immediate complications.  POSTOPERATIVE PLAN: Follow-up in 2 weeks for suture removal and wound check.  Azucena Cecil, MD [place] 9:25 AM

## 2018-09-04 ENCOUNTER — Encounter (HOSPITAL_BASED_OUTPATIENT_CLINIC_OR_DEPARTMENT_OTHER): Payer: Self-pay | Admitting: Orthopaedic Surgery

## 2018-09-04 NOTE — Anesthesia Postprocedure Evaluation (Signed)
Anesthesia Post Note  Patient: Max Ward  Procedure(s) Performed: REMOVAL GANGLION OF RIGHT WRIST AND RIGHT ELBOW (Right Wrist)     Patient location during evaluation: PACU Anesthesia Type: General Level of consciousness: awake and alert Pain management: pain level controlled Vital Signs Assessment: post-procedure vital signs reviewed and stable Respiratory status: spontaneous breathing, nonlabored ventilation and respiratory function stable Cardiovascular status: blood pressure returned to baseline and stable Postop Assessment: no apparent nausea or vomiting Anesthetic complications: no    Last Vitals:  Vitals:   09/03/18 1045 09/03/18 1120  BP: 128/88 121/83  Pulse: 78 72  Resp: 16 16  Temp:  36.5 C  SpO2: 97% 99%    Last Pain:  Vitals:   09/03/18 1120  TempSrc: Oral  PainSc: 2                  Lynda Rainwater

## 2018-09-09 ENCOUNTER — Telehealth: Payer: Self-pay | Admitting: Pulmonary Disease

## 2018-09-09 MED ORDER — ALBUTEROL SULFATE (2.5 MG/3ML) 0.083% IN NEBU: 2.5000 mg | INHALATION_SOLUTION | Freq: Four times a day (QID) | RESPIRATORY_TRACT | 12 refills | Status: DC | PRN

## 2018-09-09 NOTE — Telephone Encounter (Signed)
Pt is calling back [phone]

## 2018-09-09 NOTE — Telephone Encounter (Signed)
Called and spoke with pt letting him know that the Rx was sent in for him. Pt expressed understanding and wanted to know which company the neb machine order was sent to as he was told not to get the machine until he was able to get the meds.  I looked in the referral tab and saw that the order was sent to [facility]. I gave pt the phone number for [facility]. Pt expressed understanding. Nothing further needed.

## 2018-09-09 NOTE — Telephone Encounter (Signed)
Sent new Rx to [facility] for pt. Also made sure I had the Dx on the Rx for pt.  Attempted to call pt letting him know this had been done but unable to reach pt. Left pt a message letting him know this had been done for him.  Called [facility] and spoke with Lanelle Bal letting her know that I sent in a new Rx of pt's albuterol nebs with the Dx code on it. Lanelle Bal verbalized that she was able to see the Rx. Nothing further needed.

## 2018-09-09 NOTE — Telephone Encounter (Signed)
[facility] from [facility] needs an ICD-10 code for Albuterol Neb [phone]

## 2018-09-16 ENCOUNTER — Encounter (INDEPENDENT_AMBULATORY_CARE_PROVIDER_SITE_OTHER): Payer: Self-pay | Admitting: Orthopaedic Surgery

## 2018-09-16 ENCOUNTER — Ambulatory Visit (INDEPENDENT_AMBULATORY_CARE_PROVIDER_SITE_OTHER): Payer: Medicare Other | Admitting: Physician Assistant

## 2018-09-16 DIAGNOSIS — M71321 Other bursal cyst, right elbow: Secondary | ICD-10-CM

## 2018-09-16 DIAGNOSIS — M67431 Ganglion, right wrist: Secondary | ICD-10-CM

## 2018-09-16 MED ORDER — MUPIROCIN 2 % EX OINT: 1.0000 "application " | TOPICAL_OINTMENT | Freq: Two times a day (BID) | CUTANEOUS | 0 refills | Status: DC

## 2018-09-16 NOTE — Progress Notes (Signed)
Post-Op Visit Note   Patient: Max Ward           Date of Birth: 04-19-1971           MRN: [id number] Visit Date: 09/16/2018 PCP: Houston Siren., MD   Assessment & Plan:  Chief Complaint:  Chief Complaint  Patient presents with  . Right Elbow - Pain  . Right Wrist - Pain   Visit Diagnoses:  1. Ganglion cyst of volar aspect of right wrist   2. Other bursal cyst, right elbow     Plan: Patient is a pleasant 47 year old gentleman who presents to our clinic today 14 days status post removal ganglion cyst right volar wrist as well as removal of epidermoid cyst right elbow.  Date of surgery 09/03/2018.  He has been doing fairly well.  No fevers or chills.  Examination of the right upper extremity wounds reveals well-healing surgical incisions with nylon sutures in place.  He does have slight peri-incisional erythema to both wounds.  No drainage.  Minimal to no tenderness.  He is neurovascularly intact distally.  Today, nylon sutures were removed.  We will apply mupirocin today and I will call him in a prescription for this to start twice daily for the next 2 weeks.  No heavy lifting for the next 2 weeks.  Follow-up with [place] in 2 weeks time for recheck.  Follow-Up Instructions: Return in about 2 weeks (around 09/30/2018).   Orders:  No orders of the defined types were placed in this encounter.  Meds ordered this encounter  Medications  . mupirocin ointment (BACTROBAN) 2 %    Sig: Place 1 application into the nose 2 (two) times daily.    Dispense:  22 g    Refill:  0    Imaging: No new imaging  PMFS History: Patient Active Problem List   Diagnosis Date Noted  . Ganglion cyst of volar aspect of right wrist 09/03/2018  . Other bursal cyst, right elbow 09/03/2018  . Asthma in adult 08/05/2018  . Finger pain, right 06/24/2018  . Pain in right elbow 06/24/2018  . Pain in right wrist 06/24/2018  . Schizoaffective disorder, bipolar type ([place]) 01/18/2012   Past Medical  History:  Diagnosis Date  . Anxiety   . Asthma   . Bipolar 1 disorder ([place])   . Complication of anesthesia    woke up during colonoscoy  . Coronary artery disease   . Depression   . Heart disease   . Hypertension   . Mental disorder   . Tachycardia     Family History  Problem Relation Age of Onset  . Cancer Mother   . Emphysema Father     Past Surgical History:  Procedure Laterality Date  . BONE TUMOR EXCISION     skull  . CARDIAC CATHETERIZATION    . GANGLION CYST EXCISION Right 09/03/2018   Procedure: REMOVAL GANGLION OF RIGHT WRIST AND RIGHT ELBOW;  Surgeon: Leandrew Koyanagi, MD;  Location: [facility];  Service: Orthopedics;  Laterality: Right;  OTHER SITE IS ELBOW  . MECHANICAL AORTIC VALVE REPLACEMENT     Social History   Occupational History  . Not on file  Tobacco Use  . Smoking status: Current Every Day Smoker    Packs/day: 1.00    Years: 15.00    Pack years: 15.00    Types: Cigarettes  . Smokeless tobacco: Never Used  Substance and Sexual Activity  . Alcohol use: Yes  . Drug use:  No  . Sexual activity: Not on file

## 2018-09-30 ENCOUNTER — Ambulatory Visit (INDEPENDENT_AMBULATORY_CARE_PROVIDER_SITE_OTHER): Payer: Medicare Other | Admitting: Physician Assistant

## 2018-09-30 ENCOUNTER — Encounter (INDEPENDENT_AMBULATORY_CARE_PROVIDER_SITE_OTHER): Payer: Self-pay | Admitting: Orthopaedic Surgery

## 2018-09-30 DIAGNOSIS — M67431 Ganglion, right wrist: Secondary | ICD-10-CM

## 2018-09-30 DIAGNOSIS — M71321 Other bursal cyst, right elbow: Secondary | ICD-10-CM

## 2018-09-30 MED ORDER — CEPHALEXIN 500 MG PO CAPS: 500.0000 mg | ORAL_CAPSULE | Freq: Four times a day (QID) | ORAL | 0 refills | Status: DC

## 2018-09-30 MED ORDER — MUPIROCIN 2 % EX OINT: TOPICAL_OINTMENT | CUTANEOUS | 0 refills | Status: DC

## 2018-09-30 MED ORDER — DOXYCYCLINE HYCLATE 50 MG PO CAPS: 50.0000 mg | ORAL_CAPSULE | Freq: Two times a day (BID) | ORAL | 0 refills | Status: DC

## 2018-09-30 NOTE — Progress Notes (Addendum)
Post-Op Visit Note   Patient: Max Ward           Date of Birth: 04/06/1971           MRN: [id number] Visit Date: 09/30/2018 PCP: Houston Siren., MD   Assessment & Plan:  Chief Complaint:  Chief Complaint  Patient presents with  . Right Elbow - Follow-up   Visit Diagnoses:  1. Ganglion cyst of volar aspect of right wrist   2. Other bursal cyst, right elbow     Plan: Patient is a pleasant 47 year old gentleman who presents our clinic today approximately 4 weeks status post excision ganglion cyst right volar wrist as well as excision epidermoid cyst right elbow, date of surgery 09/03/2018.  He has been doing very well in regards to his elbow.  He has been having slight pain to the right wrist.  He has noticed what appears to be a Vicryl coming through the skin at the very distal aspect of the wrist incision.  This is slightly erythematous and tender.  No drainage.  Today, we pulled the Vicryl suture.  I applied mupirocin and a Band-Aid.  He will continue with cleaning this area with soap and water twice daily and applying mupirocin as well.  Have also called in keflex to take for the next 2 weeks.  Follow-up with [place] in 2 weeks time for recheck.  Follow-Up Instructions: Return in about 2 weeks (around 10/14/2018).   Orders:  No orders of the defined types were placed in this encounter.  Meds ordered this encounter  Medications  . doxycycline (VIBRAMYCIN) 50 MG capsule    Sig: Take 1 capsule (50 mg total) by mouth 2 (two) times daily.    Dispense:  28 capsule    Refill:  0  . mupirocin ointment (BACTROBAN) 2 %    Sig: Apply to affected area 2 times daily    Dispense:  22 g    Refill:  0    Imaging: No new imaging  PMFS History: Patient Active Problem List   Diagnosis Date Noted  . Ganglion cyst of volar aspect of right wrist 09/03/2018  . Other bursal cyst, right elbow 09/03/2018  . Asthma in adult 08/05/2018  . Finger pain, right 06/24/2018  . Pain in  right elbow 06/24/2018  . Pain in right wrist 06/24/2018  . Schizoaffective disorder, bipolar type ([place]) 01/18/2012   Past Medical History:  Diagnosis Date  . Anxiety   . Asthma   . Bipolar 1 disorder ([place])   . Complication of anesthesia    woke up during colonoscoy  . Coronary artery disease   . Depression   . Heart disease   . Hypertension   . Mental disorder   . Tachycardia     Family History  Problem Relation Age of Onset  . Cancer Mother   . Emphysema Father     Past Surgical History:  Procedure Laterality Date  . BONE TUMOR EXCISION     skull  . CARDIAC CATHETERIZATION    . GANGLION CYST EXCISION Right 09/03/2018   Procedure: REMOVAL GANGLION OF RIGHT WRIST AND RIGHT ELBOW;  Surgeon: Leandrew Koyanagi, MD;  Location: [facility];  Service: Orthopedics;  Laterality: Right;  OTHER SITE IS ELBOW  . MECHANICAL AORTIC VALVE REPLACEMENT     Social History   Occupational History  . Not on file  Tobacco Use  . Smoking status: Current Every Day Smoker    Packs/day: 1.00  Years: 15.00    Pack years: 15.00    Types: Cigarettes  . Smokeless tobacco: Never Used  Substance and Sexual Activity  . Alcohol use: Yes  . Drug use: No  . Sexual activity: Not on file

## 2018-09-30 NOTE — Addendum Note (Signed)
Addended by: Aundra Dubin on: 09/30/2018 01:25 PM   Modules accepted: Orders

## 2018-10-10 ENCOUNTER — Other Ambulatory Visit (INDEPENDENT_AMBULATORY_CARE_PROVIDER_SITE_OTHER): Payer: Self-pay | Admitting: Physician Assistant

## 2018-10-31 ENCOUNTER — Ambulatory Visit (INDEPENDENT_AMBULATORY_CARE_PROVIDER_SITE_OTHER): Payer: Medicare Other | Admitting: Pulmonary Disease

## 2018-10-31 ENCOUNTER — Encounter: Payer: Self-pay | Admitting: Pulmonary Disease

## 2018-10-31 VITALS — BP 118/74 | HR 74 | Wt 192.0 lb

## 2018-10-31 DIAGNOSIS — J4540 Moderate persistent asthma, uncomplicated: Secondary | ICD-10-CM | POA: Diagnosis not present

## 2018-10-31 MED ORDER — FLUTICASONE PROPIONATE 50 MCG/ACT NA SUSP: 1.0000 | Freq: Every day | NASAL | 2 refills | Status: AC

## 2018-10-31 MED ORDER — MONTELUKAST SODIUM 10 MG PO TABS: 10.0000 mg | ORAL_TABLET | Freq: Every day | ORAL | 3 refills | Status: DC

## 2018-10-31 MED ORDER — FLUTICASONE-SALMETEROL 250-50 MCG/DOSE IN AEPB: 1.0000 | INHALATION_SPRAY | Freq: Two times a day (BID) | RESPIRATORY_TRACT | 3 refills | Status: DC

## 2018-10-31 NOTE — Patient Instructions (Addendum)
Moderate Persistent Asthma, uncontrolled REFILL ADVAIR 250-1mcg 1 puff twice daily CONTINUE albuterol every 4-6 hours as needed for shortness of breath or wheezing START Montelukast 10 mg daily

## 2018-10-31 NOTE — Progress Notes (Signed)
Synopsis: Initially seen in 07/2018 for shortness of breath. Previously diagnosed with asthma/COPD in the 1990s and recently had worsening dyspnea and wheezing. Started on Advair.  Subjective:   PATIENT ID: Max Ward GENDER: male DOB: 1971/08/17, MRN: [id number]   HPI  Chief Complaint  Patient presents with  . Follow-up    Advair seems to help.  some dry cough - SOB was better but has gotten worse past 2 weeks.  Cardiologist says heart related pt reports   Mr. Max Ward is a 48 year old male with asthma, bicuspid AV with regurg s/p mechanical aortic valve replacement (in 2014 at [facility]), SVT and anxiety/depression who presents for follow-up of chronic dyspnea.  Last seen on 08/05/18 with me. Started on Advair for asthma. He reports that his symptoms have dyspnea, wheezing and productive cough have improved since using the inhaler. Symptoms are aggravated by smoking and activity however does feel his activity has been better. Overall he states he is better until two weeks when he had dizziness, palpitations, headaches and nausea which he attributes to his cardiac issues. These events will be associated with shortness of breath. Not sure how many times he is using his albuterol during those times. He is still smoking close to 1/2ppd. Still trying to cut down where he will have a few days where he will smoke 1/4ppd  Social: Current smoker. 15 pack years.  Previously worked in Teacher, music, Architect, Biomedical scientist  Past Medical History:  Diagnosis Date  . Anxiety   . Asthma   . Bipolar 1 disorder ([place])   . Complication of anesthesia    woke up during colonoscoy  . Coronary artery disease   . Depression   . Heart disease   . Hypertension   . Mental disorder   . Tachycardia      Family History  Problem Relation Age of Onset  . Cancer Mother   . Emphysema Father      Social History   Socioeconomic History  . Marital status: Legally Separated    Spouse  name: Not on file  . Number of children: Not on file  . Years of education: Not on file  . Highest education level: Not on file  Occupational History  . Not on file  Social Needs  . Financial resource strain: Not on file  . Food insecurity:    Worry: Not on file    Inability: Not on file  . Transportation needs:    Medical: Not on file    Non-medical: Not on file  Tobacco Use  . Smoking status: Current Every Day Smoker    Packs/day: 1.00    Years: 15.00    Pack years: 15.00    Types: Cigarettes  . Smokeless tobacco: Never Used  . Tobacco comment: 1/2 pack  or less per day Jan 2020  Substance and Sexual Activity  . Alcohol use: Yes  . Drug use: No  . Sexual activity: Not on file  Lifestyle  . Physical activity:    Days per week: Not on file    Minutes per session: Not on file  . Stress: Not on file  Relationships  . Social connections:    Talks on phone: Not on file    Gets together: Not on file    Attends religious service: Not on file    Active member of club or organization: Not on file    Attends meetings of clubs or organizations: Not on file    Relationship status:  Not on file  . Intimate partner violence:    Fear of current or ex partner: Not on file    Emotionally abused: Not on file    Physically abused: Not on file    Forced sexual activity: Not on file  Other Topics Concern  . Not on file  Social History Narrative  . Not on file     Allergies  Allergen Reactions  . Molds & Smuts Other (See Comments)    Unknown  . Valium [Diazepam] Palpitations  . Vicodin [Hydrocodone-Acetaminophen] Palpitations     Outpatient Medications Prior to Visit  Medication Sig Dispense Refill  . albuterol (PROVENTIL HFA;VENTOLIN HFA) 108 (90 Base) MCG/ACT inhaler INHALE 1-2 PUFFS BY MOUTH EVERY 4-6 HOURS AS NEEDED AND AS DIRECTED 1 Inhaler 5  . albuterol (PROVENTIL) (2.5 MG/3ML) 0.083% nebulizer solution Take 3 mLs (2.5 mg total) by nebulization every 6 (six) hours as  needed for wheezing or shortness of breath. 75 mL 12  . alprazolam (XANAX) 2 MG tablet Take 2 mg by mouth 4 (four) times daily as needed for sleep.    Marland Kitchen amphetamine-dextroamphetamine (ADDERALL XR) 25 MG 24 hr capsule Take 25 mg by mouth 2 (two) times daily.    . Asenapine Maleate (SAPHRIS) 10 MG SUBL Place 30 mg under the tongue at bedtime.    Marland Kitchen azelastine (ASTELIN) 0.1 % nasal spray Place 1 spray into both nostrils 2 (two) times daily. Use in each nostril as directed    . cephALEXin (KEFLEX) 500 MG capsule Take 1 capsule (500 mg total) by mouth 4 (four) times daily. (Patient taking differently: Take 500 mg by mouth 4 (four) times daily. Still has some - forgets to take it he says - for wrist surg and dental) 56 capsule 0  . metoprolol succinate (TOPROL-XL) 50 MG 24 hr tablet Take 50 mg by mouth daily. Take with or immediately following a meal.     . warfarin (COUMADIN) 5 MG tablet Take 1 tablet (5 mg total) by mouth daily. 30 tablet 0  . cetirizine (ZYRTEC) 10 MG chewable tablet Chew 10 mg by mouth daily.    . mupirocin ointment (BACTROBAN) 2 % Apply to affected area 2 times daily 22 g 0  . ondansetron (ZOFRAN) 4 MG tablet Take 1-2 tablets (4-8 mg total) by mouth every 8 (eight) hours as needed for nausea or vomiting. 40 tablet 0  . oxyCODONE-acetaminophen (PERCOCET) 5-325 MG tablet Take 1-2 tablets by mouth 2 (two) times daily as needed for severe pain. (Patient not taking: Reported on 10/31/2018) 20 tablet 0  . traZODone (DESYREL) 50 MG tablet Take 1 tablet (50 mg total) by mouth at bedtime as needed for sleep. For sleep (Patient taking differently: Take 100 mg by mouth at bedtime as needed for sleep. Taking 200 mg at bedtime) 30 tablet 0   No facility-administered medications prior to visit.     Review of Systems  Constitutional: Positive for malaise/fatigue. Negative for chills and fever.  HENT: Positive for congestion and sinus pain.   Eyes: Negative for redness.  Respiratory: Positive for  cough, sputum production and shortness of breath. Negative for hemoptysis.   Cardiovascular: Positive for palpitations and PND. Negative for chest pain and leg swelling.  Gastrointestinal: Negative for heartburn and nausea.  Musculoskeletal: Negative for myalgias.  Skin: Negative for rash.  Neurological: Positive for dizziness. Negative for tremors, weakness and headaches.  Endo/Heme/Allergies: Positive for environmental allergies.  Psychiatric/Behavioral: Positive for depression. The patient is nervous/anxious.  Objective:    Vitals:   10/31/18 1046  BP: 118/74  Pulse: 74  SpO2: 97%  Weight: 192 lb (87.1 kg)   Physical Exam: General: Well-appearing, no acute distress HENT: [place], AT, OP clear, MMM Eyes: EOMI, no scleral icterus Respiratory: Clear to auscultation bilaterally.  No crackles, wheezing or rales Cardiovascular: RRR, -M/R/G, no JVD GI: BS+, soft, nontender Extremities:-Edema,-tenderness Neuro: AAO x4, CNII-XII grossly intact Skin: Intact, no rashes or bruising Psych: Anxious mood, normal affect  PFT 08/05/18: Significant bronchodilator response in FEV1 and FVC consistent asthma. Mild restrictive defect also noted.  Labs: IgE 10 08/05/18 Eos 100 08/05/18  I personally reviewed and interpreted test results as noted.   Assessment & Plan:   Moderate persistent asthma in adult without complication  Discussion: 48 year old male with mechanical aortic valve, palpitations who presents for follow-up for moderate persistent asthma. Reviewed PFTs in-clinic. Testing and symptoms consistent with asthma.    Moderate persistent asthma, uncontrolled, not in active exacerbation REFILL ADVAIR 250-32mcg 1 puff twice daily CONTINUE albuterol every 4-6 hours as needed for shortness of breath or wheezing START Montelukast 10 mg daily  Tobacco abuse Patient is an active smoker. We discussed smoking cessation for 5 minutes. We discussed triggers and stressors and ways to  deal with them. We discussed barriers to continued smoking and benefits of smoking cessation. Provided patient with information cessation techniques and interventions including [place] quitline.   Follow-up in 3 months  Ilyaas Musto Rodman Pickle, MD [facility] 11/02/2018 6:42 PM  Personal pager: [phone] If unanswered, please page CCM On-call: [phone]

## 2018-11-27 ENCOUNTER — Ambulatory Visit: Payer: Medicare Other | Admitting: Internal Medicine

## 2018-11-28 ENCOUNTER — Encounter: Payer: Self-pay | Admitting: *Deleted

## 2018-12-08 ENCOUNTER — Encounter: Payer: Self-pay | Admitting: Internal Medicine

## 2018-12-08 ENCOUNTER — Encounter (INDEPENDENT_AMBULATORY_CARE_PROVIDER_SITE_OTHER): Payer: Self-pay

## 2018-12-08 ENCOUNTER — Ambulatory Visit (INDEPENDENT_AMBULATORY_CARE_PROVIDER_SITE_OTHER): Payer: Medicare Other | Admitting: Internal Medicine

## 2018-12-08 VITALS — BP 110/72 | HR 80 | Ht 68.5 in | Wt 191.0 lb

## 2018-12-08 DIAGNOSIS — E782 Mixed hyperlipidemia: Secondary | ICD-10-CM

## 2018-12-08 DIAGNOSIS — I10 Essential (primary) hypertension: Secondary | ICD-10-CM

## 2018-12-08 DIAGNOSIS — I471 Supraventricular tachycardia: Secondary | ICD-10-CM

## 2018-12-08 DIAGNOSIS — I493 Ventricular premature depolarization: Secondary | ICD-10-CM | POA: Diagnosis not present

## 2018-12-08 DIAGNOSIS — R0600 Dyspnea, unspecified: Secondary | ICD-10-CM

## 2018-12-08 DIAGNOSIS — Z952 Presence of prosthetic heart valve: Secondary | ICD-10-CM

## 2018-12-08 NOTE — Patient Instructions (Signed)
Medication Instructions:  Dr. Shaune Pascal recommends that you continue on your current medications as directed. Please refer to the Current Medication list given to you today.  If you need a refill on your cardiac medications before your next appointment, please call your pharmacy.   Lab work: None ordered If you have labs (blood work) drawn today and your tests are completely normal, you will receive your results only by: Marland Kitchen MyChart Message (if you have MyChart) OR . A paper copy in the mail If you have any lab test that is abnormal or we need to change your treatment, we will call you to review the results.  Testing/Procedures: Dr.Acharya has requested that you have an echocardiogram. Echocardiography is a painless test that uses sound waves to create images of your heart. It provides your doctor with information about the size and shape of your heart and how well your heart's chambers and valves are working. This procedure takes approximately one hour. There are no restrictions for this procedure.    Follow-Up: At [facility], you and your health needs are our priority.  As part of our continuing mission to provide you with exceptional heart care, we have created designated Provider Care Teams.  These Care Teams include your primary Cardiologist (physician) and Advanced Practice Providers (APPs -  Physician Assistants and Nurse Practitioners) who all work together to provide you with the care you need, when you need it. You will need a follow up appointment in 12 months.  Please call our office 2 months in advance to schedule this appointment.  You may see  Dr.Acharya  or one of the following Advanced Practice Providers on your designated Care Team:   Rosaria Ferries, PA-C . Jory Sims, DNP, ANP

## 2018-12-08 NOTE — Progress Notes (Signed)
Cardiology Office Note:    Date:  12/08/2018   ID:  ARATH KAIGLER, DOB Jun 26, 1971, MRN [id number]  PCP:  Houston Siren., MD  Cardiologist:  No primary care provider on file.  Electrophysiologist:  None   Referring MD: Houston Siren., MD   Hx AVR for BAV; shortness of breath.  History of Present Illness:    Max Ward is a 48 y.o. male with a hx of anxiety, bipolar 1 disorder, CAD, and bicuspid aortic valve s/p AVR, and 46 mm ascending aorta dilatation most recently assessed in October 2019.   He is an active smoker and is concerned about shortness of breath. He has been told it is not related to allergies. He cannot fully describe it but does feel it is primarily related to panic attacks. He is worried about his "dilated atria and ventricles" and lives with constant worry that his heart will burst while he is driving. We discussed again that his cardiac chamber dimensions by echo at [facility] are not concerning, and he should not be worried about his heart bursting. We did discuss that his ascending aorta needs to be monitored, and was last measured at 4.6 cm. Annual screening would be appropriate. He demonstrates significant tangential thought and does not demonstrate an appropriate understand of his underlying cardiovascular conditions. We have discussed these in detail at our last visit and today to provide clarity.   The patient denies chest pain, chest pressure, palpitations, PND, orthopnea, or leg swelling. Denies syncope or presyncope. Denies dizziness or lightheadedness.   Past Medical History:  Diagnosis Date  . Anxiety   . Asthma   . Bipolar 1 disorder ([place])   . Complication of anesthesia    woke up during colonoscoy  . Coronary artery disease   . Depression   . Heart disease   . Hypertension   . Mental disorder   . Tachycardia     Past Surgical History:  Procedure Laterality Date  . BONE TUMOR EXCISION     skull  . CARDIAC CATHETERIZATION    .  GANGLION CYST EXCISION Right 09/03/2018   Procedure: REMOVAL GANGLION OF RIGHT WRIST AND RIGHT ELBOW;  Surgeon: Leandrew Koyanagi, MD;  Location: [place];  Service: Orthopedics;  Laterality: Right;  OTHER SITE IS ELBOW  . MECHANICAL AORTIC VALVE REPLACEMENT      Current Medications: Current Meds  Medication Sig  . albuterol (PROVENTIL HFA;VENTOLIN HFA) 108 (90 Base) MCG/ACT inhaler INHALE 1-2 PUFFS BY MOUTH EVERY 4-6 HOURS AS NEEDED AND AS DIRECTED  . albuterol (PROVENTIL) (2.5 MG/3ML) 0.083% nebulizer solution Take 3 mLs (2.5 mg total) by nebulization every 6 (six) hours as needed for wheezing or shortness of breath.  . alprazolam (XANAX) 2 MG tablet Take 2 mg by mouth 4 (four) times daily as needed for sleep.  Marland Kitchen amphetamine-dextroamphetamine (ADDERALL XR) 25 MG 24 hr capsule Take 25 mg by mouth 2 (two) times daily.  . Asenapine Maleate (SAPHRIS) 10 MG SUBL Place 30 mg under the tongue at bedtime.  Marland Kitchen azelastine (ASTELIN) 0.1 % nasal spray Place 1 spray into both nostrils 2 (two) times daily. Use in each nostril as directed  . fluticasone (FLONASE) 50 MCG/ACT nasal spray Place 1 spray into both nostrils daily.  . Fluticasone-Salmeterol (ADVAIR DISKUS) 250-50 MCG/DOSE AEPB Inhale 1 puff into the lungs 2 (two) times daily.  . metoprolol succinate (TOPROL-XL) 50 MG 24 hr tablet Take 50 mg by mouth daily. Take with or immediately  following a meal.   . montelukast (SINGULAIR) 10 MG tablet Take 1 tablet (10 mg total) by mouth daily.  Marland Kitchen warfarin (COUMADIN) 5 MG tablet Take 1 tablet (5 mg total) by mouth daily.     Allergies:   Molds & smuts; Valium [diazepam]; and Vicodin [hydrocodone-acetaminophen]   Social History   Socioeconomic History  . Marital status: Legally Separated    Spouse name: Not on file  . Number of children: Not on file  . Years of education: Not on file  . Highest education level: Not on file  Occupational History  . Not on file  Social Needs  . Financial  resource strain: Not on file  . Food insecurity:    Worry: Not on file    Inability: Not on file  . Transportation needs:    Medical: Not on file    Non-medical: Not on file  Tobacco Use  . Smoking status: Current Every Day Smoker    Packs/day: 1.00    Years: 15.00    Pack years: 15.00    Types: Cigarettes  . Smokeless tobacco: Never Used  . Tobacco comment: 1/2 pack  or less per day Jan 2020  Substance and Sexual Activity  . Alcohol use: Yes  . Drug use: No  . Sexual activity: Not on file  Lifestyle  . Physical activity:    Days per week: Not on file    Minutes per session: Not on file  . Stress: Not on file  Relationships  . Social connections:    Talks on phone: Not on file    Gets together: Not on file    Attends religious service: Not on file    Active member of club or organization: Not on file    Attends meetings of clubs or organizations: Not on file    Relationship status: Not on file  Other Topics Concern  . Not on file  Social History Narrative  . Not on file     Family History: The patient's family history includes Cancer in his mother; Emphysema in his father.  ROS:   Please see the history of present illness.    All other systems reviewed and are negative.  EKGs/Labs/Other Studies Reviewed:    The following studies were reviewed today:  EKG:  Not obtained today.   Recent Labs: 08/05/2018: Hemoglobin 17.1; Platelets 234.0  Recent Lipid Panel No results found for: CHOL, TRIG, HDL, CHOLHDL, VLDL, LDLCALC, LDLDIRECT  Physical Exam:    VS:  BP 110/72   Pulse 80   Ht 5' 8.5" (1.74 m)   Wt 191 lb (86.6 kg)   SpO2 98%   BMI 28.62 kg/m     Wt Readings from Last 3 Encounters:  12/08/18 191 lb (86.6 kg)  10/31/18 192 lb (87.1 kg)  09/03/18 190 lb 14.7 oz (86.6 kg)     Constitutional: No acute distress Eyes: pupils equally round and reactive to light, sclera non-icteric, normal conjunctiva and lids ENMT: normal dentition, moist mucous  membranes Cardiovascular: regular rhythm, bradycardic rate, no murmurs. s1 normal. Crisp mechanical valve closure sound at S2. Radial pulses normal bilaterally. No jugular venous distention.  Respiratory: clear to auscultation bilaterally GI : normal bowel sounds, soft and nontender. No distention.   MSK: extremities warm, well perfused. No edema.  NEURO: grossly nonfocal exam, moves all extremities. PSYCH: alert and oriented x 3, normal mood and affect.   ASSESSMENT:    1. Dyspnea, unspecified type   2. PVC (premature ventricular  contraction)   3. Mixed hyperlipidemia   4. SVT (supraventricular tachycardia) ([place])   5. Essential hypertension   6. Status post mechanical aortic valve replacement    PLAN:    With his history of mechanical aortic valve we should ensure his shortness of breath is not related to mechanical valve dysfunction. By exam it has appropriate closure sounds without significant systolic or diastolic murmur.   I have encouraged him to quit smoking as this will assist with his shortness of breath.   We will follow up the echocardiogram, if it is normal and no prosthetic dysfunction, we will plan for annual follow up.   Medication Adjustments/Labs and Tests Ordered: Current medicines are reviewed at length with the patient today.  Concerns regarding medicines are outlined above.  Orders Placed This Encounter  Procedures  . ECHOCARDIOGRAM COMPLETE   No orders of the defined types were placed in this encounter.   Patient Instructions  Medication Instructions:  Dr. Shaune Pascal recommends that you continue on your current medications as directed. Please refer to the Current Medication list given to you today.  If you need a refill on your cardiac medications before your next appointment, please call your pharmacy.   Lab work: None ordered If you have labs (blood work) drawn today and your tests are completely normal, you will receive your results only by: Marland Kitchen MyChart  Message (if you have MyChart) OR . A paper copy in the mail If you have any lab test that is abnormal or we need to change your treatment, we will call you to review the results.  Testing/Procedures: Dr.Olesya Wike has requested that you have an echocardiogram. Echocardiography is a painless test that uses sound waves to create images of your heart. It provides your doctor with information about the size and shape of your heart and how well your heart's chambers and valves are working. This procedure takes approximately one hour. There are no restrictions for this procedure.    Follow-Up: At [facility], you and your health needs are our priority.  As part of our continuing mission to provide you with exceptional heart care, we have created designated Provider Care Teams.  These Care Teams include your primary Cardiologist (physician) and Advanced Practice Providers (APPs -  Physician Assistants and Nurse Practitioners) who all work together to provide you with the care you need, when you need it. You will need a follow up appointment in 12 months.  Please call our office 2 months in advance to schedule this appointment.  You may see  Dr.Joda Braatz  or one of the following Advanced Practice Providers on your designated Care Team:   Rosaria Ferries, PA-C . Jory Sims, DNP, ANP        Signed, Elouise Munroe, MD  12/08/2018 5:43 PM     [facility]

## 2018-12-12 ENCOUNTER — Ambulatory Visit (HOSPITAL_COMMUNITY): Payer: Medicare Other | Attending: Cardiovascular Disease

## 2018-12-12 DIAGNOSIS — R0600 Dyspnea, unspecified: Secondary | ICD-10-CM | POA: Diagnosis not present

## 2018-12-30 ENCOUNTER — Telehealth: Payer: Self-pay | Admitting: Pharmacist Clinician (PhC)/ Clinical Pharmacy Specialist

## 2018-12-30 NOTE — Telephone Encounter (Signed)
Spoke with Tax inspector at [place].  Patient had previously expressed switching to our coumadin clinic as it is much closer to his home.  Patient of Dr. Honor Junes.    Caren Griffins states patient has aortic valve replacement, INR goal 2.0-2.5.  Patient has been mostly stable at 5 mg daily, although he sometimes has issues with bipolar disorder and compliance.  INR was checked today 1.2, so patient was given lovenox 90 mg bid x 3 days and asked to return on Friday.  States he doesn't have enough gas to get back to [place] this week, so would like to transfer care now.    Appointment set for Friday 10:30 am.  Patient notified and thankful able to switch at this time.

## 2019-01-01 ENCOUNTER — Telehealth: Payer: Self-pay

## 2019-01-01 NOTE — Telephone Encounter (Signed)
1. Have you recently travelled abroad or to [place], [place], or [place]? NO 2. Do you currently have a fever? NO 3. Have you been in contact with someone that is currently pending confirmation of COVID19 testing or has been confirmed to have the COVID19 virus? NO 4. Are you currently experiencing fatigue or cough? YES 5. Are you currently experiencing new or worsening shortness of breath at rest or with minimal activity? NO 6. Have you been in contact with someone that was recently sick with fever/cough/fatigue? NO   **A score of 4 or more should result in cancellation of the pts cardiology appt  **A score of 2 should be provided a mask prior to admission into the lobby  **TRAVEL to a high risk area or contact with a confirmed case should stay at home, away from confirmed patient, monitor symptoms, and reach out to PCP for evisit, additional testing.   **ALL PTS WITH FEVER SHOULD BE REFERRED TO PCP FOR EVISIT  Pt. Advised that we are restricting visitors at this time and request that only patients present for check-in prior to their appointment. All other visitors should remain in their car. If necessary, only one visitor may come with the patient into the building. For everyone's safety, all patients and visitors entering our practice area should expect to be screened again prior to entering our waiting area.

## 2019-01-02 ENCOUNTER — Ambulatory Visit (INDEPENDENT_AMBULATORY_CARE_PROVIDER_SITE_OTHER): Payer: Medicare Other | Admitting: *Deleted

## 2019-01-02 DIAGNOSIS — Z7901 Long term (current) use of anticoagulants: Secondary | ICD-10-CM | POA: Diagnosis not present

## 2019-01-02 DIAGNOSIS — Z952 Presence of prosthetic heart valve: Secondary | ICD-10-CM

## 2019-01-02 LAB — POCT INR: INR: 2 (ref 2.0–3.0)

## 2019-01-02 NOTE — Patient Instructions (Addendum)
Description   Continue taking 5mg  Daily. Recheck in 2 weeks. Stop taking Lovenox injections. Call us with any questions, medication changes or bleeding concerns # [phone].

## 2019-01-05 ENCOUNTER — Telehealth (INDEPENDENT_AMBULATORY_CARE_PROVIDER_SITE_OTHER): Payer: Self-pay

## 2019-01-05 ENCOUNTER — Other Ambulatory Visit (INDEPENDENT_AMBULATORY_CARE_PROVIDER_SITE_OTHER): Payer: Self-pay

## 2019-01-05 MED ORDER — TRAMADOL HCL 50 MG PO TABS: 50.0000 mg | ORAL_TABLET | Freq: Two times a day (BID) | ORAL | 0 refills | Status: DC

## 2019-01-05 NOTE — Telephone Encounter (Signed)
Tramadol #30

## 2019-01-05 NOTE — Telephone Encounter (Signed)
Called patient would like to cancel appt. Would like something for pain. [facility]. Patient was coming in for right shoulder/wrist pain.   +coughing  +abdominal pain/acid reflux +ulcers +Asthma +allergies    CB [phone]

## 2019-01-05 NOTE — Telephone Encounter (Signed)
Rx Called into pharm

## 2019-01-06 ENCOUNTER — Ambulatory Visit (INDEPENDENT_AMBULATORY_CARE_PROVIDER_SITE_OTHER): Payer: Medicare Other | Admitting: Orthopaedic Surgery

## 2019-01-14 ENCOUNTER — Telehealth: Payer: Self-pay | Admitting: Pharmacist Clinician (PhC)/ Clinical Pharmacy Specialist

## 2019-01-14 NOTE — Telephone Encounter (Signed)

## 2019-01-16 ENCOUNTER — Ambulatory Visit (INDEPENDENT_AMBULATORY_CARE_PROVIDER_SITE_OTHER): Payer: Medicare Other | Admitting: *Deleted

## 2019-01-16 ENCOUNTER — Other Ambulatory Visit: Payer: Self-pay

## 2019-01-16 DIAGNOSIS — Z7901 Long term (current) use of anticoagulants: Secondary | ICD-10-CM

## 2019-01-16 DIAGNOSIS — Z952 Presence of prosthetic heart valve: Secondary | ICD-10-CM | POA: Diagnosis not present

## 2019-01-16 LAB — POCT INR: INR: 2.6 (ref 2.0–3.0)

## 2019-01-16 NOTE — Patient Instructions (Signed)
Description   Spoke with pt and instructed to take 2.5mg  today then continue taking 5mg  daily. Recheck in 3 weeks. Call us with any questions, medication changes or bleeding concerns # [phone].

## 2019-01-26 ENCOUNTER — Telehealth: Payer: Self-pay | Admitting: *Deleted

## 2019-01-26 NOTE — Telephone Encounter (Signed)
Called to  Set up appointment with patient.  Per instructions Patient received  information on 01/22/19 . Patient states  He is weak , and has not been able to exercise over month  and has increase heart rates especially if he bends over.   sinus issues now -  Informed patient not use and medication with a decongestant. He verbalized understanding states he is taking benadryl.   patient also states she has been taking 2 tablets of 50 mg metprolol xl  ( total of 100 mg daily) instead of  75 mg ( 1 and 1/2 tablets of 50 mg) as prescribed by dr Margaretann Loveless.  Patient aware will defer to dr Margaretann Loveless and contact him back --appointment is schedule for may 14 ,at 11 am for present time

## 2019-02-04 ENCOUNTER — Telehealth: Payer: Self-pay

## 2019-02-04 NOTE — Telephone Encounter (Signed)

## 2019-02-06 ENCOUNTER — Other Ambulatory Visit: Payer: Self-pay

## 2019-02-24 ENCOUNTER — Telehealth: Payer: Self-pay | Admitting: Internal Medicine

## 2019-02-25 NOTE — Telephone Encounter (Signed)
Attempted to call pt to reschedule missed [facility]in Clinic appt.  LMOM TCB.

## 2019-02-25 NOTE — Telephone Encounter (Signed)
-----   Message from Philbert Riser sent at 02/24/2019  3:20 PM EDT ----- Regarding: RESCHEDULE Good afternoon,  This patient called to reschedule his drive through appointment, I'm not sure how this is done. ([phone]) Max Ward.  Thanks

## 2019-02-26 ENCOUNTER — Telehealth: Payer: Medicare Other | Admitting: Internal Medicine

## 2019-02-26 NOTE — Telephone Encounter (Signed)
LM again.

## 2019-03-10 ENCOUNTER — Telehealth: Payer: Self-pay

## 2019-03-10 ENCOUNTER — Other Ambulatory Visit: Payer: Self-pay | Admitting: Pulmonary Disease

## 2019-03-10 ENCOUNTER — Telehealth: Payer: Self-pay | Admitting: Internal Medicine

## 2019-03-10 NOTE — Telephone Encounter (Signed)
Called pt to change appointment to virtual visit. Pt declined and stated he would rather be seen in office.

## 2019-03-11 ENCOUNTER — Ambulatory Visit (INDEPENDENT_AMBULATORY_CARE_PROVIDER_SITE_OTHER): Payer: Medicare Other | Admitting: Orthopaedic Surgery

## 2019-03-11 ENCOUNTER — Telehealth: Payer: Self-pay

## 2019-03-11 ENCOUNTER — Encounter: Payer: Self-pay | Admitting: Orthopaedic Surgery

## 2019-03-11 ENCOUNTER — Ambulatory Visit (INDEPENDENT_AMBULATORY_CARE_PROVIDER_SITE_OTHER): Payer: Medicare Other

## 2019-03-11 ENCOUNTER — Ambulatory Visit (INDEPENDENT_AMBULATORY_CARE_PROVIDER_SITE_OTHER): Payer: Medicare Other | Admitting: Internal Medicine

## 2019-03-11 ENCOUNTER — Encounter: Payer: Self-pay | Admitting: Internal Medicine

## 2019-03-11 ENCOUNTER — Other Ambulatory Visit: Payer: Self-pay

## 2019-03-11 VITALS — BP 132/86 | Ht 68.5 in | Wt 194.6 lb

## 2019-03-11 DIAGNOSIS — G8929 Other chronic pain: Secondary | ICD-10-CM

## 2019-03-11 DIAGNOSIS — I10 Essential (primary) hypertension: Secondary | ICD-10-CM | POA: Diagnosis not present

## 2019-03-11 DIAGNOSIS — E782 Mixed hyperlipidemia: Secondary | ICD-10-CM

## 2019-03-11 DIAGNOSIS — Z7901 Long term (current) use of anticoagulants: Secondary | ICD-10-CM

## 2019-03-11 DIAGNOSIS — M25511 Pain in right shoulder: Secondary | ICD-10-CM

## 2019-03-11 DIAGNOSIS — I493 Ventricular premature depolarization: Secondary | ICD-10-CM

## 2019-03-11 DIAGNOSIS — I471 Supraventricular tachycardia: Secondary | ICD-10-CM

## 2019-03-11 DIAGNOSIS — Q231 Congenital insufficiency of aortic valve: Secondary | ICD-10-CM

## 2019-03-11 DIAGNOSIS — F250 Schizoaffective disorder, bipolar type: Secondary | ICD-10-CM

## 2019-03-11 DIAGNOSIS — Z952 Presence of prosthetic heart valve: Secondary | ICD-10-CM

## 2019-03-11 MED ORDER — METHYLPREDNISOLONE ACETATE 40 MG/ML IJ SUSP
40.0000 mg | INTRAMUSCULAR | Status: AC | PRN
  Administered 2019-03-11: 40 mg via INTRA_ARTICULAR

## 2019-03-11 MED ORDER — BUPIVACAINE HCL 0.5 % IJ SOLN
3.0000 mL | INTRAMUSCULAR | Status: AC | PRN
  Administered 2019-03-11: 3 mL via INTRA_ARTICULAR

## 2019-03-11 MED ORDER — LIDOCAINE HCL 1 % IJ SOLN
3.0000 mL | INTRAMUSCULAR | Status: AC | PRN
  Administered 2019-03-11: 3 mL

## 2019-03-11 NOTE — Progress Notes (Signed)
Cardiology Office Note:    Date:  03/11/2019   ID:  Max Ward, DOB [birth year], MRN [id number]  PCP:  Houston Siren., MD  Cardiologist:  Elouise Munroe, MD  Electrophysiologist:  None   Referring MD: Houston Siren., MD   Chief Complaint: f/u palpitations, mechanical AVR  History of Present Illness:    Max Ward is a 48 y.o. male with a hx of a hx of anxiety, bipolar 1 disorder, CAD, and bicuspid aortic valve s/p AVR, and 46 mm ascending aorta dilatation most recently assessed in October 2019.   He is noted to have frequent ectopy on his last echocardiogram, and has PVCs on ecg today.  I have suggested that we perform a cardiac monitor to evaluate his symptoms of fatigue with exertion and frequent PVCs.  He feels he would like to wait until [place] restrictions are lifted since he has not been able to go to the gym and that is when he feels most symptomatic.  He is otherwise doing well and plans to follow-up with Coumadin clinic tomorrow.  He notes that he has missed appointments for his Coumadin checks, and previously his INR has been subtherapeutic.  Fortunately I can clearly hear his valve closure sound.  He had previously discussed a TEE to better evaluate the valve, we can continue to consider this as COVID restrictions are lifted.  The patient denies chest pain, chest pressure, dyspnea at rest or with exertion, PND, orthopnea, or leg swelling. Denies syncope or presyncope. Denies dizziness or lightheadedness.   Past Medical History:  Diagnosis Date  . Anxiety   . Asthma   . Bipolar 1 disorder ([place])   . Complication of anesthesia    woke up during colonoscoy  . Coronary artery disease   . Depression   . Heart disease   . Hypertension   . Mental disorder   . Tachycardia     Past Surgical History:  Procedure Laterality Date  . BONE TUMOR EXCISION     skull  . CARDIAC CATHETERIZATION    . GANGLION CYST EXCISION Right 09/03/2018   Procedure:  REMOVAL GANGLION OF RIGHT WRIST AND RIGHT ELBOW;  Surgeon: Leandrew Koyanagi, MD;  Location: [facility];  Service: Orthopedics;  Laterality: Right;  OTHER SITE IS ELBOW  . MECHANICAL AORTIC VALVE REPLACEMENT      Current Medications: Current Meds  Medication Sig  . ADVAIR DISKUS 250-50 MCG/DOSE AEPB TAKE 1 PUFF BY MOUTH TWICE A DAY  . albuterol (PROVENTIL HFA;VENTOLIN HFA) 108 (90 Base) MCG/ACT inhaler INHALE 1-2 PUFFS BY MOUTH EVERY 4-6 HOURS AS NEEDED AND AS DIRECTED  . albuterol (PROVENTIL) (2.5 MG/3ML) 0.083% nebulizer solution Take 3 mLs (2.5 mg total) by nebulization every 6 (six) hours as needed for wheezing or shortness of breath.  . alprazolam (XANAX) 2 MG tablet Take 2 mg by mouth 4 (four) times daily as needed for sleep.  Marland Kitchen amphetamine-dextroamphetamine (ADDERALL XR) 25 MG 24 hr capsule Take 25 mg by mouth 2 (two) times daily.  . Asenapine Maleate (SAPHRIS) 10 MG SUBL Place 30 mg under the tongue at bedtime.  Marland Kitchen azelastine (ASTELIN) 0.1 % nasal spray Place 1 spray into both nostrils 2 (two) times daily. Use in each nostril as directed  . fluticasone (FLONASE) 50 MCG/ACT nasal spray Place 1 spray into both nostrils daily.  . montelukast (SINGULAIR) 10 MG tablet Take 1 tablet (10 mg total) by mouth daily.  . traMADol (ULTRAM) 50 MG tablet  Take 1 tablet (50 mg total) by mouth 2 (two) times daily.  Marland Kitchen warfarin (COUMADIN) 5 MG tablet Take 1 tablet (5 mg total) by mouth daily.     Allergies:   Molds & smuts; Valium [diazepam]; and Vicodin [hydrocodone-acetaminophen]   Social History   Socioeconomic History  . Marital status: Legally Separated    Spouse name: Not on file  . Number of children: Not on file  . Years of education: Not on file  . Highest education level: Not on file  Occupational History  . Not on file  Social Needs  . Financial resource strain: Not on file  . Food insecurity:    Worry: Not on file    Inability: Not on file  . Transportation needs:     Medical: Not on file    Non-medical: Not on file  Tobacco Use  . Smoking status: Current Every Day Smoker    Packs/day: 1.00    Years: 15.00    Pack years: 15.00    Types: Cigarettes  . Smokeless tobacco: Never Used  . Tobacco comment: 1/2 pack  or less per day Jan 2020  Substance and Sexual Activity  . Alcohol use: Yes  . Drug use: No  . Sexual activity: Not on file  Lifestyle  . Physical activity:    Days per week: Not on file    Minutes per session: Not on file  . Stress: Not on file  Relationships  . Social connections:    Talks on phone: Not on file    Gets together: Not on file    Attends religious service: Not on file    Active member of club or organization: Not on file    Attends meetings of clubs or organizations: Not on file    Relationship status: Not on file  Other Topics Concern  . Not on file  Social History Narrative  . Not on file     Family History: The patient's family history includes Cancer in his mother; Emphysema in his father.  ROS:   Please see the history of present illness.    All other systems reviewed and are negative.  EKGs/Labs/Other Studies Reviewed:    The following studies were reviewed today:  EKG: Sinus rhythm with frequent PVCs.  Recent Labs: 08/05/2018: Hemoglobin 17.1; Platelets 234.0  Recent Lipid Panel No results found for: CHOL, TRIG, HDL, CHOLHDL, VLDL, LDLCALC, LDLDIRECT  Physical Exam:    VS:  BP 132/86   Ht 5' 8.5" (1.74 m)   Wt 194 lb 9.6 oz (88.3 kg)   BMI 29.16 kg/m     Wt Readings from Last 5 Encounters:  03/11/19 194 lb 9.6 oz (88.3 kg)  12/08/18 191 lb (86.6 kg)  10/31/18 192 lb (87.1 kg)  09/03/18 190 lb 14.7 oz (86.6 kg)  08/28/18 193 lb (87.5 kg)    Constitutional: No acute distress Eyes: sclera non-icteric, normal conjunctiva and lids Cardiovascular: regular rhythm, normal rate, no murmurs.  Crisp valve closure sound at S2.  Radial pulses normal bilaterally. No jugular venous distention.   Respiratory: clear to auscultation bilaterally GI : normal bowel sounds, soft and nontender. No distention.   MSK: extremities warm, well perfused. No edema.  NEURO: grossly nonfocal exam, moves all extremities. PSYCH: alert and oriented x 3, normal mood and affect.   ASSESSMENT:    1. PVC (premature ventricular contraction)   2. Status post mechanical aortic valve replacement   3. Long term (current) use of anticoagulants  4. Essential hypertension   5. SVT (supraventricular tachycardia) ([place])   6. Mixed hyperlipidemia   7. Schizoaffective disorder, bipolar type ([place])   8. Bicuspid aortic valve    PLAN:    I have offered the patient an extended cardiac monitor for his palpitations and he would like to wait until he is back at the gym to evaluate these in the setting of his time of most symptoms.  I think this is reasonable.  We increased the dose of his metoprolol to 50 mg twice daily for the treatment of his palpitations, and feels this has improved his symptoms of palpitations.  He will contact the office when he feels he is back to his normal routine, and we can likely place a 24 to 48-hour Holter monitor given that he has daily symptoms.  He has a mechanical aortic valve for history of bicuspid aortic valve, I have stressed to him with emphasis that he should not miss doses of his anticoagulation for fear of thrombosis on the valve.  We discussed his symptoms of fatigue and shortness of breath further today and he does not feel that these are significant.  This in combination with a crisp valve closure sound suggests there is no urgency to obtaining a TEE, however if he has any worsening of the symptoms it would be very important to evaluate the valve.  He will have his INR checked tomorrow.  Hypertension-stable with the use of metoprolol.   TIME SPENT WITH PATIENT: 25 minutes of direct patient care. More than 50% of that time was spent on coordination of care and counseling  regarding PVCs, palpitations, and further work-up.  Cherlynn Kaiser, MD [place]  [facility]   Medication Adjustments/Labs and Tests Ordered: Current medicines are reviewed at length with the patient today.  Concerns regarding medicines are outlined above.  Orders Placed This Encounter  Procedures  . EKG 12-Lead   No orders of the defined types were placed in this encounter.   Patient Instructions  Medication Instructions:  Your Physician recommend you continue on your current medication as directed.    If you need a refill on your cardiac medications before your next appointment, please call your pharmacy.   Lab work: None  Testing/Procedures: None  Follow-Up: At [facility], you and your health needs are our priority.  As part of our continuing mission to provide you with exceptional heart care, we have created designated Provider Care Teams.  These Care Teams include your primary Cardiologist (physician) and Advanced Practice Providers (APPs -  Physician Assistants and Nurse Practitioners) who all work together to provide you with the care you need, when you need it. You will need a follow up appointment in 6 months.  Please call our office 2 months in advance to schedule this appointment.  You may see Elouise Munroe, MD or one of the following Advanced Practice Providers on your designated Care Team:   Rosaria Ferries, PA-C . Jory Sims, DNP, ANP

## 2019-03-11 NOTE — Patient Instructions (Signed)
Medication Instructions:  Your Physician recommend you continue on your current medication as directed.    If you need a refill on your cardiac medications before your next appointment, please call your pharmacy.   Lab work: None  Testing/Procedures: None  Follow-Up: At [facility], you and your health needs are our priority.  As part of our continuing mission to provide you with exceptional heart care, we have created designated Provider Care Teams.  These Care Teams include your primary Cardiologist (physician) and Advanced Practice Providers (APPs -  Physician Assistants and Nurse Practitioners) who all work together to provide you with the care you need, when you need it. You will need a follow up appointment in 6 months.  Please call our office 2 months in advance to schedule this appointment.  You may see Elouise Munroe, MD or one of the following Advanced Practice Providers on your designated Care Team:   Rosaria Ferries, PA-C . Jory Sims, DNP, ANP

## 2019-03-11 NOTE — Progress Notes (Signed)
Office Visit Note   Patient: Max Ward           Date of Birth: [birth year]           MRN: [id number] Visit Date: 03/11/2019              Requested by: Houston Siren., MD [address] [place], [place] [address] PCP: Houston Siren., MD   Assessment & Plan: Visit Diagnoses:  1. Chronic right shoulder pain     Plan: Impression is chronic right shoulder pain.  Overall rotator cuff appears to be intact with strength and function.  He may be having symptoms due to the [facility] joint or subacromial bursitis.  Subacromial injection performed today.  Patient is to take it easy over the next couple weeks.  If he does not notice any improvement he will give [place] a call so that we can order an MRI.  Patient in agreement with the plan.  Follow-Up Instructions: Return if symptoms worsen or fail to improve.   Orders:  Orders Placed This Encounter  Procedures  . XR Shoulder Right   No orders of the defined types were placed in this encounter.     Procedures: Large Joint Inj: R subacromial bursa on 03/11/2019 3:47 PM Indications: pain Details: 22 G needle  Arthrogram: No  Medications: 3 mL lidocaine 1 %; 3 mL bupivacaine 0.5 %; 40 mg methylPREDNISolone acetate 40 MG/ML Outcome: tolerated well, no immediate complications Consent was given by the patient. Patient was prepped and draped in the usual sterile fashion.       Clinical Data: No additional findings.   Subjective: Chief Complaint  Patient presents with  . Right Shoulder - Pain    [name] is a 48 year old right-hand-dominant gentleman who I previously took care of for multiple cysts of his right upper extremity comes in with chronic right shoulder pain for about 2 months when he fell directly on the left shoulder.  He states that the has had pain with limitation in range of motion since then.  He does have a history of chronic back pain.  He states that he cannot lay flat or reach over and out to grab things.  The  tramadol that he has taken does not provide him with significant relief.  Denies any numbness and tingling.   Review of Systems  Constitutional: Negative.   All other systems reviewed and are negative.    Objective: Vital Signs: There were no vitals taken for this visit.  Physical Exam Vitals signs and nursing note reviewed.  Constitutional:      Appearance: He is well-developed.  HENT:     Head: Normocephalic and atraumatic.  Eyes:     Pupils: Pupils are equal, round, and reactive to light.  Neck:     Musculoskeletal: Neck supple.  Pulmonary:     Effort: Pulmonary effort is normal.  Abdominal:     Palpations: Abdomen is soft.  Musculoskeletal: Normal range of motion.  Skin:    General: Skin is warm.  Neurological:     Mental Status: He is alert and oriented to person, place, and time.  Psychiatric:        Behavior: Behavior normal.        Thought Content: Thought content normal.        Judgment: Judgment normal.     Ortho Exam Right shoulder exam shows normal range of motion with pain at the extremes.  Rotator cuff is grossly intact to manual muscle  testing.  Mildly positive impingement signs.  Mildly positive cross body adduction. Specialty Comments:  No specialty comments available.  Imaging: Xr Shoulder Right  Result Date: 03/11/2019 No acute abnormalities.  Moderate AC joint arthrosis.  Mild glenohumeral arthrosis.    PMFS History: Patient Active Problem List   Diagnosis Date Noted  . Ganglion cyst of volar aspect of right wrist 09/03/2018  . Moderate persistent asthma 08/05/2018  . PVC (premature ventricular contraction) 07/14/2018  . SOB (shortness of breath) 07/14/2018  . Finger pain, right 06/24/2018  . Pain in right elbow 06/24/2018  . Pain in right wrist 06/24/2018  . Ganglion cyst 12/19/2017  . Finger injury, right, sequela 12/19/2017  . Palpitations 08/29/2017  . Dental caries 06/27/2017  . Chronic pain of right knee 03/13/2017  . Abnormal  coagulation profile 07/11/2016  . Dysfunction of right eustachian tube 07/11/2016  . 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 07/11/2016  . Right ear pain 07/11/2016  . Mixed hyperlipidemia 02/21/2016  . Seasonal allergic rhinitis due to pollen 02/21/2016  . Encounter for therapeutic drug monitoring 02/16/2016  . Common migraine 02/15/2016  . High risk medication use 02/15/2016  . History of traumatic head injury 02/15/2016  . Low back pain 02/15/2016  . Neck pain 02/15/2016  . Status post mechanical aortic valve replacement 11/25/2015  . ADHD (attention deficit hyperactivity disorder) 10/18/2015  . Allergic rhinitis 10/18/2015  . Anemia 10/18/2015  . Asthma 10/18/2015  . GERD (gastroesophageal reflux disease) 10/18/2015  . Herniated nucleus pulposus, L4-5 10/18/2015  . Insomnia 10/18/2015  . Pain syndrome, chronic 10/18/2015  . SVT (supraventricular tachycardia) ([place]) 10/18/2015  . Essential hypertension 05/30/2015  . Long term (current) use of anticoagulants 05/30/2015  . Postural dizziness with presyncope 05/30/2015  . Bipolar affective disorder ([place]) 08/09/2011  . Chest pain 08/09/2011  . Anxiety 08/09/2011  . Depressed affect 08/09/2011  . Permanent form of junctional reciprocating tachycardia ([place]) 08/09/2011  . Tobacco use 08/09/2011   Past Medical History:  Diagnosis Date  . Anxiety   . Asthma   . Bipolar 1 disorder ([place])   . Complication of anesthesia    woke up during colonoscoy  . Coronary artery disease   . Depression   . Heart disease   . Hypertension   . Mental disorder   . Tachycardia     Family History  Problem Relation Age of Onset  . Cancer Mother   . Emphysema Father     Past Surgical History:  Procedure Laterality Date  . BONE TUMOR EXCISION     skull  . CARDIAC CATHETERIZATION    . GANGLION CYST EXCISION Right 09/03/2018   Procedure: REMOVAL GANGLION OF RIGHT WRIST AND RIGHT ELBOW;  Surgeon: Leandrew Koyanagi, MD;  Location: [facility];  Service:  Orthopedics;  Laterality: Right;  OTHER SITE IS ELBOW  . MECHANICAL AORTIC VALVE REPLACEMENT     Social History   Occupational History  . Not on file  Tobacco Use  . Smoking status: Current Every Day Smoker    Packs/day: 1.00    Years: 15.00    Pack years: 15.00    Types: Cigarettes  . Smokeless tobacco: Never Used  . Tobacco comment: 1/2 pack  or less per day Jan 2020  Substance and Sexual Activity  . Alcohol use: Yes  . Drug use: No  . Sexual activity: Not on file

## 2019-03-11 NOTE — Telephone Encounter (Signed)
LMOM FOR PRESCREEN  

## 2019-03-12 ENCOUNTER — Ambulatory Visit (INDEPENDENT_AMBULATORY_CARE_PROVIDER_SITE_OTHER): Payer: Medicare Other

## 2019-03-12 DIAGNOSIS — Z7901 Long term (current) use of anticoagulants: Secondary | ICD-10-CM | POA: Diagnosis not present

## 2019-03-12 DIAGNOSIS — Z952 Presence of prosthetic heart valve: Secondary | ICD-10-CM

## 2019-03-12 LAB — POCT INR: INR: 2.5 (ref 2.0–3.0)

## 2019-03-12 NOTE — Progress Notes (Signed)
Will contact [employer] to see if we can re-activate his account.  Pt still has home meter.

## 2019-03-12 NOTE — Patient Instructions (Signed)
Description   Spoke with pt and instructed to continue taking 5mg  daily. Recheck in 4 weeks. Call us with any questions, medication changes or bleeding concerns # [phone].

## 2019-03-14 ENCOUNTER — Telehealth: Payer: Self-pay

## 2019-03-14 NOTE — Telephone Encounter (Signed)
No vm, non working number. Pincus Badder covid s/sx following potential covid exposure

## 2019-04-02 ENCOUNTER — Telehealth: Payer: Self-pay | Admitting: *Deleted

## 2019-04-02 ENCOUNTER — Telehealth: Payer: Self-pay

## 2019-04-02 DIAGNOSIS — R002 Palpitations: Secondary | ICD-10-CM

## 2019-04-02 DIAGNOSIS — I493 Ventricular premature depolarization: Secondary | ICD-10-CM

## 2019-04-02 NOTE — Telephone Encounter (Signed)
Spoke with patient and he is feeling some palpitations now, would like to proceed with monitor. Order for 48 hour holter placed in Epic. Message sent to schedulers and monitor department to arrange.   PLAN:    I have offered the patient an extended cardiac monitor for his palpitations and he would like to wait until he is back at the gym to evaluate these in the setting of his time of most symptoms.  I think this is reasonable.  We increased the dose of his metoprolol to 50 mg twice daily for the treatment of his palpitations, and feels this has improved his symptoms of palpitations.  He will contact the office when he feels he is back to his normal routine, and we can likely place a 24 to 48-hour Holter monitor given that he has daily symptoms.

## 2019-04-02 NOTE — Telephone Encounter (Signed)
[employer] to ship 3 day long term holter monitor to his home.  Instructions reviewed briefly as they are included in the monitor kit. Patient aware he needs to wear monitor 48 hours, but can wear up to 3 days.

## 2019-04-02 NOTE — Telephone Encounter (Signed)
screeened

## 2019-04-02 NOTE — Telephone Encounter (Signed)

## 2019-04-09 ENCOUNTER — Ambulatory Visit: Payer: Medicare Other

## 2019-04-09 ENCOUNTER — Ambulatory Visit (INDEPENDENT_AMBULATORY_CARE_PROVIDER_SITE_OTHER): Payer: Medicare Other

## 2019-04-09 ENCOUNTER — Other Ambulatory Visit: Payer: Self-pay | Admitting: Pulmonary Disease

## 2019-04-09 DIAGNOSIS — I493 Ventricular premature depolarization: Secondary | ICD-10-CM

## 2019-04-09 DIAGNOSIS — R002 Palpitations: Secondary | ICD-10-CM | POA: Diagnosis not present

## 2019-04-14 NOTE — Telephone Encounter (Signed)
Opened in error

## 2019-05-01 ENCOUNTER — Ambulatory Visit (INDEPENDENT_AMBULATORY_CARE_PROVIDER_SITE_OTHER): Payer: Medicare Other | Admitting: *Deleted

## 2019-05-01 ENCOUNTER — Other Ambulatory Visit: Payer: Self-pay

## 2019-05-01 DIAGNOSIS — Z952 Presence of prosthetic heart valve: Secondary | ICD-10-CM | POA: Diagnosis not present

## 2019-05-01 DIAGNOSIS — Z7901 Long term (current) use of anticoagulants: Secondary | ICD-10-CM

## 2019-05-01 LAB — POCT INR: INR: 2.4 (ref 2.0–3.0)

## 2019-05-01 MED ORDER — WARFARIN SODIUM 5 MG PO TABS: 5.0000 mg | ORAL_TABLET | ORAL | 3 refills | Status: DC

## 2019-05-01 NOTE — Patient Instructions (Signed)
Description   continue taking 5mg  daily. Recheck in 4 weeks. Call us with any questions, medication changes or bleeding concerns # [phone].

## 2019-05-19 ENCOUNTER — Ambulatory Visit (INDEPENDENT_AMBULATORY_CARE_PROVIDER_SITE_OTHER): Payer: Medicare Other

## 2019-05-19 ENCOUNTER — Other Ambulatory Visit: Payer: Self-pay

## 2019-05-19 ENCOUNTER — Encounter: Payer: Self-pay | Admitting: Orthopaedic Surgery

## 2019-05-19 ENCOUNTER — Ambulatory Visit (INDEPENDENT_AMBULATORY_CARE_PROVIDER_SITE_OTHER): Payer: Medicare Other | Admitting: Orthopaedic Surgery

## 2019-05-19 VITALS — Ht 68.5 in | Wt 194.0 lb

## 2019-05-19 DIAGNOSIS — S81802A Unspecified open wound, left lower leg, initial encounter: Secondary | ICD-10-CM

## 2019-05-19 DIAGNOSIS — G8929 Other chronic pain: Secondary | ICD-10-CM

## 2019-05-19 DIAGNOSIS — M25511 Pain in right shoulder: Secondary | ICD-10-CM | POA: Diagnosis not present

## 2019-05-19 MED ORDER — SULFAMETHOXAZOLE-TRIMETHOPRIM 800-160 MG PO TABS: 1.0000 | ORAL_TABLET | Freq: Two times a day (BID) | ORAL | 0 refills | Status: DC

## 2019-05-19 NOTE — Progress Notes (Signed)
Office Visit Note   Patient: Max Ward           Date of Birth: 1971/04/05           MRN: [id number] Visit Date: 05/19/2019              Requested by: Houston Siren., MD [address] [place],  [place] [address] PCP: Houston Siren., MD   Assessment & Plan: Visit Diagnoses:  1. Chronic right shoulder pain   2. Wound of left lower extremity, initial encounter     Plan: Impression is possible right shoulder rotator cuff tear.  #2 left midshaft tibia wound.  In regards to the right shoulder, we will go ahead and obtain an MRI to further assess the rotator cuff.  He will follow-up with [place] once that has been completed.  In regards to the left leg wound, we will have him stop his current antibiotic and start him on Bactrim for 10 days.  Should his symptoms worsen in the meantime, he will call us and let us know.  Follow-Up Instructions: Return in about 2 weeks (around 06/02/2019) for discuss MRI.   Orders:  Orders Placed This Encounter  Procedures  . XR Tibia/Fibula Left  . MR SHOULDER RIGHT WO CONTRAST   Meds ordered this encounter  Medications  . sulfamethoxazole-trimethoprim (BACTRIM DS) 800-160 MG tablet    Sig: Take 1 tablet by mouth 2 (two) times daily.    Dispense:  20 tablet    Refill:  0      Procedures: No procedures performed   Clinical Data: No additional findings.   Subjective: Chief Complaint  Patient presents with  . Right Shoulder - Follow-up    HPI patient is a pleasant 48 year old gentleman who presents our clinic today with recurrent right shoulder pain.  He fell landing on his right shoulder back in early March 2020.  He was seen in our office in May where he was given a subacromial cortisone injection.  This moderately helped, but only lasted for about 3 weeks.  He is here for further evaluation treatment recommendation.  The pain he has is to the entire shoulder and radiates down into his hand.  Pain is worse with any motion of the  shoulder.  He has increased pain when trying to drive his car as it is a stick shift.  No previous surgical intervention to the right shoulder.  Other issue he brings up today is his left shin.  Approximately 1-1/2 weeks ago he was helping his friend with duct work when a piece of galvanized steel punctured the left mid tibia.  He states it went down to bone as it bent the metal.  He did note fevers and chills for approximately 2 days soon after.  He also noted that he had a golf ball size swelling to the puncture site with associated erythema and extreme tenderness.  He took leftover antibiotics that he had from his teeth which he thinks may have been clindamycin.  He has noticed slight improvement in symptoms since.  He also notes that his last tetanus shot was in 2018.  Review of Systems as detailed in HPI.  All others reviewed and are negative.   Objective: Vital Signs: Ht 5' 8.5" (1.74 m)   Wt 194 lb (88 kg)   BMI 29.07 kg/m   Physical Exam well-developed well-nourished gentleman in no acute distress.  Alert and oriented x3.  Ortho Exam examination of his right shoulder reveals  approximately 75% active range of motion all planes.  He has 3 out of 5 strength throughout.  He is neurovascular intact distally.  Left mid tibia shows an area of possible abscess formation.  This is very tender.  Mild erythema.  No drainage, no induration and no cellulitis  Specialty Comments:  No specialty comments available.  Imaging: Xr Tibia/fibula Left  Result Date: 05/19/2019 X-rays demonstrate a small osseous lesion to the posterior aspect of the lower leg.  Otherwise, no acute findings    PMFS History: Patient Active Problem List   Diagnosis Date Noted  . Ganglion cyst of volar aspect of right wrist 09/03/2018  . Moderate persistent asthma 08/05/2018  . PVC (premature ventricular contraction) 07/14/2018  . SOB (shortness of breath) 07/14/2018  . Finger pain, right 06/24/2018  . Pain in right  elbow 06/24/2018  . Pain in right wrist 06/24/2018  . Ganglion cyst 12/19/2017  . Finger injury, right, sequela 12/19/2017  . Palpitations 08/29/2017  . Dental caries 06/27/2017  . Chronic pain of right knee 03/13/2017  . Abnormal coagulation profile 07/11/2016  . Dysfunction of right eustachian tube 07/11/2016  . 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 07/11/2016  . Right ear pain 07/11/2016  . Mixed hyperlipidemia 02/21/2016  . Seasonal allergic rhinitis due to pollen 02/21/2016  . Encounter for therapeutic drug monitoring 02/16/2016  . Common migraine 02/15/2016  . High risk medication use 02/15/2016  . History of traumatic head injury 02/15/2016  . Low back pain 02/15/2016  . Neck pain 02/15/2016  . Status post mechanical aortic valve replacement 11/25/2015  . ADHD (attention deficit hyperactivity disorder) 10/18/2015  . Allergic rhinitis 10/18/2015  . Anemia 10/18/2015  . Asthma 10/18/2015  . GERD (gastroesophageal reflux disease) 10/18/2015  . Herniated nucleus pulposus, L4-5 10/18/2015  . Insomnia 10/18/2015  . Pain syndrome, chronic 10/18/2015  . SVT (supraventricular tachycardia) ([place]) 10/18/2015  . Essential hypertension 05/30/2015  . Long term (current) use of anticoagulants 05/30/2015  . Postural dizziness with presyncope 05/30/2015  . Bipolar affective disorder ([place]) 08/09/2011  . Chest pain 08/09/2011  . Anxiety 08/09/2011  . Depressed affect 08/09/2011  . Permanent form of junctional reciprocating tachycardia ([place]) 08/09/2011  . Tobacco use 08/09/2011   Past Medical History:  Diagnosis Date  . Anxiety   . Asthma   . Bipolar 1 disorder ([place])   . Complication of anesthesia    woke up during colonoscoy  . Coronary artery disease   . Depression   . Heart disease   . Hypertension   . Mental disorder   . Tachycardia     Family History  Problem Relation Age of Onset  . Cancer Mother   . Emphysema Father     Past Surgical History:  Procedure Laterality Date  . BONE  TUMOR EXCISION     skull  . CARDIAC CATHETERIZATION    . GANGLION CYST EXCISION Right 09/03/2018   Procedure: REMOVAL GANGLION OF RIGHT WRIST AND RIGHT ELBOW;  Surgeon: Leandrew Koyanagi, MD;  Location: [place];  Service: Orthopedics;  Laterality: Right;  OTHER SITE IS ELBOW  . MECHANICAL AORTIC VALVE REPLACEMENT     Social History   Occupational History  . Not on file  Tobacco Use  . Smoking status: Current Every Day Smoker    Packs/day: 1.00    Years: 15.00    Pack years: 15.00    Types: Cigarettes  . Smokeless tobacco: Never Used  . Tobacco comment: 1/2 pack  or less per  day Jan 2020  Substance and Sexual Activity  . Alcohol use: Yes  . Drug use: No  . Sexual activity: Not on file

## 2019-05-20 ENCOUNTER — Ambulatory Visit (INDEPENDENT_AMBULATORY_CARE_PROVIDER_SITE_OTHER): Payer: Medicare Other | Admitting: Cardiology

## 2019-05-20 DIAGNOSIS — Z952 Presence of prosthetic heart valve: Secondary | ICD-10-CM | POA: Diagnosis not present

## 2019-05-20 DIAGNOSIS — Z7901 Long term (current) use of anticoagulants: Secondary | ICD-10-CM

## 2019-05-20 LAB — POCT INR: INR: 2.6 (ref 2.0–3.0)

## 2019-05-28 ENCOUNTER — Telehealth: Payer: Self-pay

## 2019-05-28 NOTE — Telephone Encounter (Signed)
Follow up on appt reminder.  NO answer at home today but recall reminder had been sent for July appt. Patient has not scheduled appt yet.

## 2019-05-29 ENCOUNTER — Ambulatory Visit (INDEPENDENT_AMBULATORY_CARE_PROVIDER_SITE_OTHER): Payer: Medicare Other | Admitting: *Deleted

## 2019-05-29 ENCOUNTER — Other Ambulatory Visit: Payer: Self-pay

## 2019-05-29 DIAGNOSIS — Z7901 Long term (current) use of anticoagulants: Secondary | ICD-10-CM | POA: Diagnosis not present

## 2019-05-29 DIAGNOSIS — Z952 Presence of prosthetic heart valve: Secondary | ICD-10-CM

## 2019-05-29 LAB — POCT INR: INR: 3.4 — AB (ref 2.0–3.0)

## 2019-05-29 NOTE — Patient Instructions (Addendum)
Description   Skip today's dose, tomorrow take 2.5mg , then Continue taking 5mg  daily. Recheck in 2 weeks with self-monitor.

## 2019-06-02 ENCOUNTER — Other Ambulatory Visit: Payer: Self-pay

## 2019-06-02 DIAGNOSIS — I493 Ventricular premature depolarization: Secondary | ICD-10-CM

## 2019-06-02 NOTE — Progress Notes (Signed)
Notes recorded by Elouise Munroe, MD on 05/30/2019 at 2:16 PM EDT  High burden of PVCs.  I would increase his metoprolol succinate to 75 mg daily and refer him to EP for discussion of additional therapy or treatment.

## 2019-06-08 ENCOUNTER — Telehealth: Payer: Self-pay | Admitting: Internal Medicine

## 2019-06-08 ENCOUNTER — Ambulatory Visit (INDEPENDENT_AMBULATORY_CARE_PROVIDER_SITE_OTHER): Payer: Medicare Other | Admitting: Cardiology

## 2019-06-08 ENCOUNTER — Telehealth: Payer: Self-pay

## 2019-06-08 DIAGNOSIS — Z952 Presence of prosthetic heart valve: Secondary | ICD-10-CM

## 2019-06-08 DIAGNOSIS — Z7901 Long term (current) use of anticoagulants: Secondary | ICD-10-CM

## 2019-06-08 LAB — POCT INR: INR: 2.7 (ref 2.0–3.0)

## 2019-06-08 NOTE — Telephone Encounter (Signed)
Spoke with pt about upcoming appt on 06/10/19. Pt stated he will check his vitals prior to app. Pt questions and concerns were address.

## 2019-06-08 NOTE — Telephone Encounter (Signed)
-----   Message from Max Serge, NP sent at 06/08/2019  6:40 AM EDT ----- Pt called wanting to be seen today but no reason why, please call pt thanks.

## 2019-06-08 NOTE — Telephone Encounter (Signed)
Lm to call back ./cy 

## 2019-06-08 NOTE — Telephone Encounter (Signed)
New message:     Patient calling stating that his HR about 40 to 42  and patient is feeling week and would like to see some one this week. Patient called the doctor on call last night. Please call patient.

## 2019-06-10 ENCOUNTER — Other Ambulatory Visit: Payer: Self-pay

## 2019-06-10 ENCOUNTER — Encounter: Payer: Self-pay | Admitting: Internal Medicine

## 2019-06-10 ENCOUNTER — Telehealth (INDEPENDENT_AMBULATORY_CARE_PROVIDER_SITE_OTHER): Payer: Medicare Other | Admitting: Internal Medicine

## 2019-06-10 VITALS — BP 139/86 | HR 40 | Ht 68.5 in | Wt 196.0 lb

## 2019-06-10 DIAGNOSIS — I493 Ventricular premature depolarization: Secondary | ICD-10-CM

## 2019-06-10 DIAGNOSIS — R002 Palpitations: Secondary | ICD-10-CM | POA: Diagnosis not present

## 2019-06-10 MED ORDER — FLECAINIDE ACETATE 50 MG PO TABS: 50.0000 mg | ORAL_TABLET | Freq: Two times a day (BID) | ORAL | 3 refills | Status: DC

## 2019-06-10 NOTE — Progress Notes (Signed)
Electrophysiology TeleHealth Note  Due to national recommendations of social distancing due to Max Ward 19, an audio telehealth visit is felt to be most appropriate for this patient at this time.  Verbal consent was obtained by me for the telehealth visit today.  The patient does not have capability for a virtual visit.  A phone visit is therefore required today.   Date:  06/10/2019   ID:  Max Ward, DOB October 11, 1971, MRN [id number]  Location: patient's home  Provider location:  [facility]  Evaluation Performed: Follow-up visit  PCP:  Max Ward., MD   Electrophysiologist:  Max Ward Primary Cardiologist:  Max Ward  Chief Complaint:  palpitations  History of Present Illness:    Max Ward is a 48 y.o. male who presents via telehealth conferencing today.  He is referred by Max Ward for EP consultation regarding frequent PVCs.  He has a h/o anxiety, bipolar disorder and prior ablation for paroxysmal junctional reciprocating tachycardia by Max Ward for which he underwent prior ablation. Unfortunately, there are no available details.  He also has a h/o bicuspid aortic valve for which he underwent valve replacement with a mechanical AV.  He had cath 07/15/17 (reviewed) at [facility] which revealed normal cors.  His primarily concern is with irregular heart beat.  + SOB and fatigue.  He states "I am eat up with three kinds of arthritis.  I have chronic pain.  I injured my back and could not walk for 3 years.  I broke every vertebra from my skull to my tailbone".  I had to teach myselft to walk again.  I now walk with a cane and am very limited by arthritis."  Today, he denies symptoms of chest pain, lower extremity edema, dizziness, presyncope, or syncope.  The patient is otherwise without complaint today.  The patient denies symptoms of fevers, chills, cough, or new SOB worrisome for COVID 19.  Past Medical History:  Diagnosis Date  . Anxiety   . Asthma   . Bipolar 1  disorder ([place])   . Complication of anesthesia    woke up during colonoscoy  . DDD (degenerative disc disease), cervical   . Depression   . DJD (degenerative joint disease)   . Hypertension   . SVT (supraventricular tachycardia) ([facility])    s/p prior ablation by Max Ward at [facility] for [place]    Past Surgical History:  Procedure Laterality Date  . BONE TUMOR EXCISION     skull  . CARDIAC CATHETERIZATION  2018   [facility] reveals normal cors  . GANGLION CYST EXCISION Right 09/03/2018   Procedure: REMOVAL GANGLION OF RIGHT WRIST AND RIGHT ELBOW;  Surgeon: Max Koyanagi, MD;  Location: [place];  Service: Orthopedics;  Laterality: Right;  OTHER SITE IS ELBOW  . MECHANICAL AORTIC VALVE REPLACEMENT      Current Outpatient Medications  Medication Sig Dispense Refill  . ADVAIR DISKUS 250-50 MCG/DOSE AEPB TAKE 1 PUFF BY MOUTH TWICE A DAY 180 each 1  . albuterol (PROVENTIL HFA;VENTOLIN HFA) 108 (90 Base) MCG/ACT inhaler INHALE 1-2 PUFFS BY MOUTH EVERY 4-6 HOURS AS NEEDED AND AS DIRECTED 1 Inhaler 5  . albuterol (PROVENTIL) (2.5 MG/3ML) 0.083% nebulizer solution Take 3 mLs (2.5 mg total) by nebulization every 6 (six) hours as needed for wheezing or shortness of breath. 75 mL 12  . alprazolam (XANAX) 2 MG tablet Take 2 mg by mouth 4 (four) times daily as needed for sleep.    Marland Kitchen  Asenapine Maleate (SAPHRIS) 10 MG SUBL Place 30 mg under the tongue at bedtime.    Marland Kitchen azelastine (ASTELIN) 0.1 % nasal spray Place 1 spray into both nostrils 2 (two) times daily. Use in each nostril as directed    . fluticasone (FLONASE) 50 MCG/ACT nasal spray Place 1 spray into both nostrils daily. 16 g 2  . hydrOXYzine (VISTARIL) 50 MG capsule Take 1 capsule by mouth as needed for anxiety.    . metoprolol succinate (TOPROL-XL) 50 MG 24 hr tablet Take 50 mg by mouth daily. Take with or immediately following a meal.     . montelukast (SINGULAIR) 10 MG tablet TAKE 1 TABLET BY MOUTH EVERY DAY 90 tablet 0  . traMADol  (ULTRAM) 50 MG tablet Take 1 tablet (50 mg total) by mouth 2 (two) times daily. 30 tablet 0  . traZODone (DESYREL) 50 MG tablet Take 200 mg by mouth at bedtime.    Marland Kitchen warfarin (COUMADIN) 5 MG tablet Take 1 tablet (5 mg total) by mouth as directed. 40 tablet 3   No current facility-administered medications for this visit.     Allergies:   Molds & smuts, Valium [diazepam], and Vicodin [hydrocodone-acetaminophen]   Social History:  The patient  reports that he has been smoking cigarettes. He has a 15.00 pack-year smoking history. He has never used smokeless tobacco. He reports current alcohol use. He reports that he does not use drugs.   Family History:  The patient's  family history includes Cancer in his mother; Emphysema in his father.   ROS:  Please see the history of present illness.   All other systems are personally reviewed and negative.    Exam:    Vital Signs:  BP 139/86   Pulse (!) 40   Ht 5' 8.5" (1.74 m)   Wt 196 lb (88.9 kg)   BMI 29.37 kg/m   Well sounding  Labs/Other Tests and Data Reviewed:    Recent Labs: 08/05/2018: Hemoglobin 17.1; Platelets 234.0   Wt Readings from Last 3 Encounters:  06/10/19 196 lb (88.9 kg)  05/19/19 194 lb (88 kg)  03/11/19 194 lb 9.6 oz (88.3 kg)     EKGs reveal sinus rhythm with RBB/LAHB morphology PVCs Cath reviewed in Care everywhere  holter 05/13/2019 reveals 24% PVC burden  ASSESSMENT & PLAN:    1.  PVCs Therapeutic strategies for PVCs including medicine (flecianide) and ablation were discussed in detail with the patient today. Risk, benefits, and alternatives to EP study and radiofrequency ablation were also discussed in detail today.  At this time, I would advise flecainide 50mg  BID.  Given prior aortic valve and long term anticoagulation, I would not rush to ablation. If he does well with this, we should proceed with ETT to evaluate for exercise induced arrhythmias on this medicine.   Follow-up:  Return to see EP NP in  2-4 weeks   Patient Risk:  after full review of this patients clinical status, I feel that they are at moderate risk at this time.  Today, I have spent 15 minutes with the patient with telehealth technology discussing arrhythmia management .    Army Fossa, MD  06/10/2019 9:41 AM     [address] [place] [address] [phone] (office) [phone] (fax)

## 2019-06-11 NOTE — Telephone Encounter (Signed)
Pt had appt with Dr Rayann Heman on 8/26 .Adonis Housekeeper

## 2019-06-19 ENCOUNTER — Other Ambulatory Visit: Payer: Self-pay

## 2019-06-19 ENCOUNTER — Ambulatory Visit
Admission: RE | Admit: 2019-06-19 | Discharge: 2019-06-19 | Disposition: A | Discharge status: Home / Self Care | Payer: Medicare Other | Source: Ambulatory Visit | Attending: Orthopaedic Surgery | Admitting: Orthopaedic Surgery

## 2019-06-19 DIAGNOSIS — G8929 Other chronic pain: Secondary | ICD-10-CM

## 2019-06-19 DIAGNOSIS — M25511 Pain in right shoulder: Secondary | ICD-10-CM

## 2019-06-23 ENCOUNTER — Ambulatory Visit: Payer: Medicare Other | Admitting: Orthopaedic Surgery

## 2019-06-24 ENCOUNTER — Other Ambulatory Visit: Payer: Self-pay

## 2019-06-24 ENCOUNTER — Ambulatory Visit (INDEPENDENT_AMBULATORY_CARE_PROVIDER_SITE_OTHER): Payer: Medicare Other | Admitting: Orthopaedic Surgery

## 2019-06-24 ENCOUNTER — Encounter: Payer: Self-pay | Admitting: Orthopaedic Surgery

## 2019-06-24 VITALS — Ht 68.0 in | Wt 196.0 lb

## 2019-06-24 DIAGNOSIS — M19011 Primary osteoarthritis, right shoulder: Secondary | ICD-10-CM

## 2019-06-24 DIAGNOSIS — M19019 Primary osteoarthritis, unspecified shoulder: Secondary | ICD-10-CM

## 2019-06-24 NOTE — Progress Notes (Signed)
Subjective: He is here for a diagnostic/therapeutic right AC joint injection under ultrasound guidance.  Objective: He is tender at the [facility] joint and also in the trapezius belly.  Procedure: Ultrasound-guided right AC joint injection: After sterile prep with Betadine, injected 5 cc 1% lidocaine without epinephrine and 40 mg methylprednisolone into the [facility] joint using ultrasound guidance.  He had some improvement in pain but not complete.  Still complaining of pain in the trapezius.  He will follow-up as scheduled.

## 2019-06-24 NOTE — Progress Notes (Signed)
Office Visit Note   Patient: Max Ward           Date of Birth: 03/15/1971           MRN: [id number] Visit Date: 06/24/2019              Requested by: Houston Siren., MD [address] [place],  [place] [address] PCP: Houston Siren., MD   Assessment & Plan: Visit Diagnoses:  1. AC joint arthropathy     Plan: Impression is right shoulder AC arthropathy and mild rotator cuff tendinopathy.  We will refer the patient to Dr. Junius Roads for an ultrasound-guided [facility] joint injection.  He will follow-up with [place] as needed.  Call with concerns or questions.  Follow-Up Instructions: Return if symptoms worsen or fail to improve.   Orders:  No orders of the defined types were placed in this encounter.  No orders of the defined types were placed in this encounter.     Procedures: No procedures performed   Clinical Data: No additional findings.   Subjective: Chief Complaint  Patient presents with  . Right Shoulder - Follow-up    MRI review right shoulder     HPI patient is a pleasant 48 year old gentleman who presents our clinic today discuss MRI results of the right shoulder.  He has been dealing with this for the past several months.  He underwent subacromial cortisone injection little over 3 months ago which moderately helped but only lasted for a few weeks.  subsequent MRI of the right shoulder showed moderate AC joint arthritis and mild supraspinatus and infraspinatus tendinopathy.  He has not had an AC joint injection.  Review of Systems as detailed in HPI.  All others reviewed and are negative.   Objective: Vital Signs: Ht 5\' 8"  (1.727 m)   Wt 196 lb (88.9 kg)   BMI 29.80 kg/m   Physical Exam well-developed well-nourished gentleman in no acute distress.  Alert and oriented x3.  Ortho Exam examination of the right shoulder reveals marked tenderness over the [facility] joint.  He has near full range of motion all planes, but does have pain with the extremes of forward  flexion and internal rotation.  Positive empty can and cross body adduction.  He is neurovascular intact distally.  Specialty Comments:  No specialty comments available.  Imaging: No new imaging   PMFS History: Patient Active Problem List   Diagnosis Date Noted  . Ganglion cyst of volar aspect of right wrist 09/03/2018  . Moderate persistent asthma 08/05/2018  . PVC (premature ventricular contraction) 07/14/2018  . SOB (shortness of breath) 07/14/2018  . Finger pain, right 06/24/2018  . Pain in right elbow 06/24/2018  . Pain in right wrist 06/24/2018  . Ganglion cyst 12/19/2017  . Finger injury, right, sequela 12/19/2017  . Palpitations 08/29/2017  . Dental caries 06/27/2017  . Chronic pain of right knee 03/13/2017  . Abnormal coagulation profile 07/11/2016  . Dysfunction of right eustachian tube 07/11/2016  . 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 07/11/2016  . Right ear pain 07/11/2016  . Mixed hyperlipidemia 02/21/2016  . Seasonal allergic rhinitis due to pollen 02/21/2016  . Encounter for therapeutic drug monitoring 02/16/2016  . Common migraine 02/15/2016  . High risk medication use 02/15/2016  . History of traumatic head injury 02/15/2016  . Low back pain 02/15/2016  . Neck pain 02/15/2016  . Status post mechanical aortic valve replacement 11/25/2015  . ADHD (attention deficit hyperactivity disorder) 10/18/2015  . Allergic rhinitis 10/18/2015  .  Anemia 10/18/2015  . Asthma 10/18/2015  . GERD (gastroesophageal reflux disease) 10/18/2015  . Herniated nucleus pulposus, L4-5 10/18/2015  . Insomnia 10/18/2015  . Pain syndrome, chronic 10/18/2015  . SVT (supraventricular tachycardia) ([place]) 10/18/2015  . Essential hypertension 05/30/2015  . Long term (current) use of anticoagulants 05/30/2015  . Postural dizziness with presyncope 05/30/2015  . Bipolar affective disorder ([place]) 08/09/2011  . Chest pain 08/09/2011  . Anxiety 08/09/2011  . Depressed affect 08/09/2011  . Permanent form  of junctional reciprocating tachycardia ([place]) 08/09/2011  . Tobacco use 08/09/2011   Past Medical History:  Diagnosis Date  . Anxiety   . Asthma   . Bipolar 1 disorder ([place])   . Complication of anesthesia    woke up during colonoscoy  . DDD (degenerative disc disease), cervical   . Depression   . DJD (degenerative joint disease)   . Hypertension   . SVT (supraventricular tachycardia) ([facility])    s/p prior ablation by Dr Seabron Spates at [facility] for [place] History  Problem Relation Age of Onset  . Cancer Mother   . Emphysema Father     Past Surgical History:  Procedure Laterality Date  . BONE TUMOR EXCISION     skull  . CARDIAC CATHETERIZATION  2018   [facility] reveals normal cors  . GANGLION CYST EXCISION Right 09/03/2018   Procedure: REMOVAL GANGLION OF RIGHT WRIST AND RIGHT ELBOW;  Surgeon: Leandrew Koyanagi, MD;  Location: [place];  Service: Orthopedics;  Laterality: Right;  OTHER SITE IS ELBOW  . MECHANICAL AORTIC VALVE REPLACEMENT     Social History   Occupational History  . Not on file  Tobacco Use  . Smoking status: Current Every Day Smoker    Packs/day: 1.00    Years: 15.00    Pack years: 15.00    Types: Cigarettes  . Smokeless tobacco: Never Used  . Tobacco comment: 1/2 pack  or less per day Jan 2020  Substance and Sexual Activity  . Alcohol use: Yes  . Drug use: No  . Sexual activity: Not on file

## 2019-06-27 LAB — POCT INR: INR: 2.7 (ref 2.0–3.0)

## 2019-06-29 ENCOUNTER — Emergency Department (HOSPITAL_COMMUNITY)
Admission: EM | Admit: 2019-06-29 | Discharge: 2019-06-30 | Disposition: A | Discharge status: Home / Self Care | Payer: Medicare Other | Attending: Emergency Medicine | Admitting: Emergency Medicine

## 2019-06-29 ENCOUNTER — Ambulatory Visit (INDEPENDENT_AMBULATORY_CARE_PROVIDER_SITE_OTHER): Payer: Medicare Other | Admitting: Pharmacist Clinician (PhC)/ Clinical Pharmacy Specialist

## 2019-06-29 ENCOUNTER — Other Ambulatory Visit: Payer: Self-pay

## 2019-06-29 ENCOUNTER — Emergency Department (HOSPITAL_COMMUNITY): Payer: Medicare Other

## 2019-06-29 ENCOUNTER — Encounter (HOSPITAL_COMMUNITY): Payer: Self-pay | Admitting: Pharmacy Technician

## 2019-06-29 DIAGNOSIS — J45909 Unspecified asthma, uncomplicated: Secondary | ICD-10-CM | POA: Diagnosis not present

## 2019-06-29 DIAGNOSIS — Z952 Presence of prosthetic heart valve: Secondary | ICD-10-CM

## 2019-06-29 DIAGNOSIS — R072 Precordial pain: Secondary | ICD-10-CM | POA: Insufficient documentation

## 2019-06-29 DIAGNOSIS — I10 Essential (primary) hypertension: Secondary | ICD-10-CM | POA: Insufficient documentation

## 2019-06-29 DIAGNOSIS — Z7901 Long term (current) use of anticoagulants: Secondary | ICD-10-CM | POA: Diagnosis not present

## 2019-06-29 DIAGNOSIS — I493 Ventricular premature depolarization: Secondary | ICD-10-CM | POA: Diagnosis not present

## 2019-06-29 DIAGNOSIS — F17210 Nicotine dependence, cigarettes, uncomplicated: Secondary | ICD-10-CM | POA: Diagnosis not present

## 2019-06-29 DIAGNOSIS — Z79899 Other long term (current) drug therapy: Secondary | ICD-10-CM | POA: Insufficient documentation

## 2019-06-29 DIAGNOSIS — R079 Chest pain, unspecified: Secondary | ICD-10-CM | POA: Diagnosis present

## 2019-06-29 LAB — CBC
HCT: 49.4 % (ref 39.0–52.0)
Hemoglobin: 16.8 g/dL (ref 13.0–17.0)
MCH: 30.4 pg (ref 26.0–34.0)
MCHC: 34 g/dL (ref 30.0–36.0)
MCV: 89.5 fL (ref 80.0–100.0)
Platelets: 276 10*3/uL (ref 150–400)
RBC: 5.52 MIL/uL (ref 4.22–5.81)
RDW: 13.1 % (ref 11.5–15.5)
WBC: 10.5 10*3/uL (ref 4.0–10.5)
nRBC: 0 % (ref 0.0–0.2)

## 2019-06-29 MED ORDER — SODIUM CHLORIDE 0.9% FLUSH: 3.0000 mL | Freq: Once | INTRAVENOUS | Status: DC

## 2019-06-29 NOTE — ED Triage Notes (Signed)
Pt bib ems with CP. L CP onset this morning worsening throughout the day.  Hx valve replacement, dilated LV. 324mg  asa and 1 SL nitro EKG unremarkable other than PVC's 136/101 P 65 96% RA CBG 134

## 2019-06-30 LAB — PROTIME-INR
INR: 1.8 — ABNORMAL HIGH (ref 0.8–1.2)
Prothrombin Time: 20.9 seconds — ABNORMAL HIGH (ref 11.4–15.2)

## 2019-06-30 LAB — BASIC METABOLIC PANEL
Anion gap: 12 (ref 5–15)
BUN: 12 mg/dL (ref 6–20)
CO2: 19 mmol/L — ABNORMAL LOW (ref 22–32)
Calcium: 9.1 mg/dL (ref 8.9–10.3)
Chloride: 106 mmol/L (ref 98–111)
Creatinine, Ser: 0.86 mg/dL (ref 0.61–1.24)
GFR calc Af Amer: >60 mL/min (ref >60)
GFR calc non Af Amer: >60 mL/min (ref >60)
Glucose, Bld: 108 mg/dL — ABNORMAL HIGH (ref 70–99)
Potassium: 3.8 mmol/L (ref 3.5–5.1)
Sodium: 137 mmol/L (ref 135–145)

## 2019-06-30 LAB — TROPONIN I (HIGH SENSITIVITY)
Troponin I (High Sensitivity): 4 ng/L (ref <18)
Troponin I (High Sensitivity): 4 ng/L (ref <18)

## 2019-06-30 MED ORDER — WARFARIN SODIUM 5 MG PO TABS
5.0000 mg | ORAL_TABLET | Freq: Once | ORAL | Status: AC
  Administered 2019-06-30: 12:00:00 5 mg via ORAL
  Filled 2019-06-30: qty 1

## 2019-06-30 MED ORDER — WARFARIN - PHYSICIAN DOSING INPATIENT: Freq: Every day | Status: DC

## 2019-06-30 NOTE — Discharge Instructions (Signed)
It was our pleasure to provide your ER care today - we hope that you feel better.  For recent chest symptoms, follow up with cardiologist in the next 1-2 weeks - call office to arrange appointment.   Avoid smoking, as smoking increases your risk of heart and lung disease.   From today's labs, your INR is 1.8 - make sure to continue to take your coumadin as prescribed, and make sure not to miss or skip doses. Follow up with your coumadin clinic.   Return to ER if worse, new symptoms, fevers, increased trouble breathing, recurrent/persistent chest pain, or other concern.

## 2019-06-30 NOTE — ED Provider Notes (Signed)
[facility] EMERGENCY DEPARTMENT Provider Note   CSN: [id number] Arrival date & time: 06/29/19  2310     History   Chief Complaint Chief Complaint  Patient presents with  . Chest Pain    HPI Max Ward is a 48 y.o. male.     Patient c/o mid/left chest pain for the past 1-2 days. Symptoms occur at rest, mild, persistent, non radiating, not pleuritic. Notes similar chest pain in past. Prior cardiac cath 2018 without significant cad. No associated sob, nv, or diaphoresis. No palpitations or rapid heart beat. No cough or uri symptoms. No fever or chills. No leg pain or swelling. +smoker. No fam hx premature cad. No hx dvt or pe. No recent surgery, trauma, immobility, or travel.   The history is provided by the patient.  Chest Pain Associated symptoms: no abdominal pain, no back pain, no fever, no headache, no nausea, no shortness of breath and no vomiting     Past Medical History:  Diagnosis Date  . Anxiety   . Asthma   . Bipolar 1 disorder ([place])   . Complication of anesthesia    woke up during colonoscoy  . DDD (degenerative disc disease), cervical   . Depression   . DJD (degenerative joint disease)   . Hypertension   . SVT (supraventricular tachycardia) ([facility])    s/p prior ablation by Dr Seabron Spates at [facility] for [place]    Patient Active Problem List   Diagnosis Date Noted  . Ganglion cyst of volar aspect of right wrist 09/03/2018  . Moderate persistent asthma 08/05/2018  . PVC (premature ventricular contraction) 07/14/2018  . SOB (shortness of breath) 07/14/2018  . Finger pain, right 06/24/2018  . Pain in right elbow 06/24/2018  . Pain in right wrist 06/24/2018  . Ganglion cyst 12/19/2017  . Finger injury, right, sequela 12/19/2017  . Palpitations 08/29/2017  . Dental caries 06/27/2017  . Chronic pain of right knee 03/13/2017  . Abnormal coagulation profile 07/11/2016  . Dysfunction of right eustachian tube 07/11/2016  . 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  07/11/2016  . Right ear pain 07/11/2016  . Mixed hyperlipidemia 02/21/2016  . Seasonal allergic rhinitis due to pollen 02/21/2016  . Encounter for therapeutic drug monitoring 02/16/2016  . Common migraine 02/15/2016  . High risk medication use 02/15/2016  . History of traumatic head injury 02/15/2016  . Low back pain 02/15/2016  . Neck pain 02/15/2016  . Status post mechanical aortic valve replacement 11/25/2015  . ADHD (attention deficit hyperactivity disorder) 10/18/2015  . Allergic rhinitis 10/18/2015  . Anemia 10/18/2015  . Asthma 10/18/2015  . GERD (gastroesophageal reflux disease) 10/18/2015  . Herniated nucleus pulposus, L4-5 10/18/2015  . Insomnia 10/18/2015  . Pain syndrome, chronic 10/18/2015  . SVT (supraventricular tachycardia) ([place]) 10/18/2015  . Essential hypertension 05/30/2015  . Long term (current) use of anticoagulants 05/30/2015  . Postural dizziness with presyncope 05/30/2015  . Bipolar affective disorder ([place]) 08/09/2011  . Chest pain 08/09/2011  . Anxiety 08/09/2011  . Depressed affect 08/09/2011  . Permanent form of junctional reciprocating tachycardia ([place]) 08/09/2011  . Tobacco use 08/09/2011    Past Surgical History:  Procedure Laterality Date  . BONE TUMOR EXCISION     skull  . CARDIAC CATHETERIZATION  2018   [facility] reveals normal cors  . GANGLION CYST EXCISION Right 09/03/2018   Procedure: REMOVAL GANGLION OF RIGHT WRIST AND RIGHT ELBOW;  Surgeon: Leandrew Koyanagi, MD;  Location: [facility];  Service: Orthopedics;  Laterality: Right;  OTHER SITE IS ELBOW  . MECHANICAL AORTIC VALVE REPLACEMENT          Home Medications    Prior to Admission medications   Medication Sig Start Date End Date Taking? Authorizing Provider  ADVAIR DISKUS 250-50 MCG/DOSE AEPB TAKE 1 PUFF BY MOUTH TWICE A DAY 03/10/19   Margaretha Seeds, MD  albuterol (PROVENTIL HFA;VENTOLIN HFA) 108 (90 Base) MCG/ACT inhaler INHALE 1-2 PUFFS BY MOUTH EVERY 4-6 HOURS AS  NEEDED AND AS DIRECTED 08/21/18   Olalere, Adewale A, MD  albuterol (PROVENTIL) (2.5 MG/3ML) 0.083% nebulizer solution Take 3 mLs (2.5 mg total) by nebulization every 6 (six) hours as needed for wheezing or shortness of breath. 09/09/18   Margaretha Seeds, MD  alprazolam Duanne Moron) 2 MG tablet Take 2 mg by mouth 4 (four) times daily as needed for sleep.    [provider]  Asenapine Maleate (SAPHRIS) 10 MG SUBL Place 30 mg under the tongue at bedtime.    [provider]  azelastine (ASTELIN) 0.1 % nasal spray Place 1 spray into both nostrils 2 (two) times daily. Use in each nostril as directed    [provider]  flecainide (TAMBOCOR) 50 MG tablet Take 1 tablet (50 mg total) by mouth 2 (two) times daily. 06/10/19   Allred, Jeneen Rinks, MD  fluticasone (FLONASE) 50 MCG/ACT nasal spray Place 1 spray into both nostrils daily. 10/31/18   Margaretha Seeds, MD  hydrOXYzine (VISTARIL) 50 MG capsule Take 1 capsule by mouth as needed for anxiety. 01/01/19   [provider]  metoprolol succinate (TOPROL-XL) 50 MG 24 hr tablet Take 50 mg by mouth daily. Take with or immediately following a meal.     [provider]  montelukast (SINGULAIR) 10 MG tablet TAKE 1 TABLET BY MOUTH EVERY DAY 04/09/19   Margaretha Seeds, MD  traMADol (ULTRAM) 50 MG tablet Take 1 tablet (50 mg total) by mouth 2 (two) times daily. 01/05/19   Leandrew Koyanagi, MD  traZODone (DESYREL) 50 MG tablet Take 200 mg by mouth at bedtime.    [provider]  warfarin (COUMADIN) 5 MG tablet Take 1 tablet (5 mg total) by mouth as directed. 05/01/19   Elouise Munroe, MD    Family History Family History  Problem Relation Age of Onset  . Cancer Mother   . Emphysema Father     Social History Social History   Tobacco Use  . Smoking status: Current Every Day Smoker    Packs/day: 1.00    Years: 15.00    Pack years: 15.00    Types: Cigarettes  . Smokeless tobacco: Never Used  . Tobacco comment: 1/2  pack  or less per day Jan 2020  Substance Use Topics  . Alcohol use: Yes  . Drug use: No     Allergies   Molds & smuts, Valium [diazepam], and Vicodin [hydrocodone-acetaminophen]   Review of Systems Review of Systems  Constitutional: Negative for fever.  HENT: Negative for sore throat.   Eyes: Negative for redness.  Respiratory: Negative for shortness of breath.   Cardiovascular: Positive for chest pain. Negative for leg swelling.  Gastrointestinal: Negative for abdominal pain, nausea and vomiting.  Genitourinary: Negative for flank pain.  Musculoskeletal: Negative for back pain and neck pain.  Skin: Negative for rash.  Neurological: Negative for headaches.  Hematological:       On coumadin, no recent abnormal bruising or bleeding.  Psychiatric/Behavioral: Negative for confusion.  Physical Exam Updated Vital Signs BP 121/78 (BP Location: Right Arm)   Pulse 79   Temp 97.7 F (36.5 C) (Oral)   Resp 17   SpO2 98%   Physical Exam Vitals signs and nursing note reviewed.  Constitutional:      Appearance: Normal appearance. He is well-developed.  HENT:     Head: Atraumatic.     Nose: Nose normal.     Mouth/Throat:     Mouth: Mucous membranes are moist.     Pharynx: Oropharynx is clear.  Eyes:     General: No scleral icterus.    Conjunctiva/sclera: Conjunctivae normal.  Neck:     Musculoskeletal: Normal range of motion and neck supple. No neck rigidity.     Trachea: No tracheal deviation.  Cardiovascular:     Rate and Rhythm: Normal rate and regular rhythm.     Pulses: Normal pulses.     Heart sounds: Normal heart sounds. No murmur. No friction rub. No gallop.   Pulmonary:     Effort: Pulmonary effort is normal. No accessory muscle usage or respiratory distress.     Breath sounds: Normal breath sounds.  Chest:     Chest wall: No tenderness.  Abdominal:     General: Bowel sounds are normal. There is no distension.     Palpations: Abdomen is soft.      Tenderness: There is no abdominal tenderness. There is no guarding.  Genitourinary:    Comments: No cva tenderness. Musculoskeletal:        General: No swelling or tenderness.     Right lower leg: No edema.     Left lower leg: No edema.  Skin:    General: Skin is warm and dry.     Findings: No rash.  Neurological:     Mental Status: He is alert.     Comments: Alert, speech clear. Steady gait.   Psychiatric:        Mood and Affect: Mood normal.      ED Treatments / Results  Labs (all labs ordered are listed, but only abnormal results are displayed) Results for orders placed or performed during the hospital encounter of [id number]  Basic metabolic panel  Result Value Ref Range   Sodium 137 135 - 145 mmol/L   Potassium 3.8 3.5 - 5.1 mmol/L   Chloride 106 98 - 111 mmol/L   CO2 19 (L) 22 - 32 mmol/L   Glucose, Bld 108 (H) 70 - 99 mg/dL   BUN 12 6 - 20 mg/dL   Creatinine, Ser 0.86 0.61 - 1.24 mg/dL   Calcium 9.1 8.9 - 10.3 mg/dL   GFR calc non Af Amer >60 >60 mL/min   GFR calc Af Amer >60 >60 mL/min   Anion gap 12 5 - 15  CBC  Result Value Ref Range   WBC 10.5 4.0 - 10.5 K/uL   RBC 5.52 4.22 - 5.81 MIL/uL   Hemoglobin 16.8 13.0 - 17.0 g/dL   HCT 49.4 39.0 - 52.0 %   MCV 89.5 80.0 - 100.0 fL   MCH 30.4 26.0 - 34.0 pg   MCHC 34.0 30.0 - 36.0 g/dL   RDW 13.1 11.5 - 15.5 %   Platelets 276 150 - 400 K/uL   nRBC 0.0 0.0 - 0.2 %  Protime-INR (order if Patient is taking Coumadin / Warfarin)  Result Value Ref Range   Prothrombin Time 20.9 (H) 11.4 - 15.2 seconds   INR 1.8 (H) 0.8 - 1.2  Troponin I (  High Sensitivity)  Result Value Ref Range   Troponin I (High Sensitivity) 4 <18 ng/L  Troponin I (High Sensitivity)  Result Value Ref Range   Troponin I (High Sensitivity) 4 <18 ng/L   Dg Chest 2 View  Result Date: 06/29/2019 CLINICAL DATA:  Chest pain EXAM: CHEST - 2 VIEW COMPARISON:  July 25, 2018 FINDINGS: Post sternotomy changes and valve prosthesis. No focal airspace  disease or effusion. Normal heart size. No pneumothorax. IMPRESSION: No active cardiopulmonary disease. Electronically Signed   By: Donavan Foil M.D.   On: 06/29/2019 23:22     EKG EKG Interpretation  Date/Time:  Monday June 29 2019 23:09:15 EDT Ventricular Rate:  89 PR Interval:    QRS Duration: 102 QT Interval:  416 QTC Calculation: 506 R Axis:   52 Text Interpretation:  Sinus rhythm Premature ventricular complexes Nonspecific T wave abnormality No significant change since last tracing Confirmed by Lajean Saver [phone]) on 06/30/2019 10:52:58 AM   Radiology Dg Chest 2 View  Result Date: 06/29/2019 CLINICAL DATA:  Chest pain EXAM: CHEST - 2 VIEW COMPARISON:  July 25, 2018 FINDINGS: Post sternotomy changes and valve prosthesis. No focal airspace disease or effusion. Normal heart size. No pneumothorax. IMPRESSION: No active cardiopulmonary disease. Electronically Signed   By: Donavan Foil M.D.   On: 06/29/2019 23:22    Procedures Procedures (including critical care time)  Medications Ordered in ED Medications  sodium chloride flush (NS) 0.9 % injection 3 mL (has no administration in time range)  warfarin (COUMADIN) tablet 5 mg (has no administration in time range)     Initial Impression / Assessment and Plan / ED Course  I have reviewed the triage vital signs and the nursing notes.  Pertinent labs & imaging results that were available during my care of the patient were reviewed by me and considered in my medical decision making (see chart for details).  Iv ns. Labs.  Cxr. Ecg.   Reviewed nursing notes and prior charts for additional history. Prior evals for chest pain in past couple years - cta neg for PE, cardiac cath 2018 with no significant cad.   Labs reviewed by me - initial trop normal.  CXR reviewed by me - no pna.   Recheck, ambulatory in ED, no active cp or discomfort.  Sinus rhythm on monitor. occ pvc.   Await delta trop.  Delta trop also normal.    After 1-2 days symptoms, trop x 2 normal. Prior eval neg for cad.   inr yesterday was 2.7 - notes from clinic note target 2-2.5 - pt states as was in ED last night, did not take his coumadin dose. Coumadin 5 mg po.  Patient current appears stable for d/c.   Rec outpt cardiology f/u.  Return precautions provided.     Final Clinical Impressions(s) / ED Diagnoses   Final diagnoses:  None    ED Discharge Orders    None       Lajean Saver, MD 06/30/19 1053

## 2019-06-30 NOTE — ED Notes (Signed)
Patient verbalizes understanding of discharge instructions. Opportunity for questioning and answers were provided. Armband removed by staff, pt discharged from ED.  

## 2019-06-30 NOTE — ED Notes (Signed)
ED Provider at bedside. 

## 2019-07-01 ENCOUNTER — Ambulatory Visit: Payer: Medicare Other | Admitting: Nurse Practitioner

## 2019-07-06 ENCOUNTER — Other Ambulatory Visit: Payer: Self-pay | Admitting: Pulmonary Disease

## 2019-07-13 ENCOUNTER — Encounter

## 2019-07-15 ENCOUNTER — Ambulatory Visit: Payer: Medicare Other | Admitting: Student

## 2019-07-15 LAB — POCT INR: INR: 1.6 — AB (ref 2.0–3.0)

## 2019-07-16 ENCOUNTER — Ambulatory Visit (INDEPENDENT_AMBULATORY_CARE_PROVIDER_SITE_OTHER): Payer: Medicare Other | Admitting: Cardiology

## 2019-07-16 DIAGNOSIS — Z7901 Long term (current) use of anticoagulants: Secondary | ICD-10-CM | POA: Diagnosis not present

## 2019-07-16 DIAGNOSIS — Z952 Presence of prosthetic heart valve: Secondary | ICD-10-CM | POA: Diagnosis not present

## 2019-07-16 MED ORDER — WARFARIN SODIUM 5 MG PO TABS: ORAL_TABLET | ORAL | 1 refills | Status: DC

## 2019-07-25 ENCOUNTER — Other Ambulatory Visit: Payer: Self-pay | Admitting: Internal Medicine

## 2019-07-30 LAB — POCT INR: INR: 1.9 — AB (ref 2.0–3.0)

## 2019-07-31 ENCOUNTER — Ambulatory Visit (INDEPENDENT_AMBULATORY_CARE_PROVIDER_SITE_OTHER): Payer: Medicare Other | Admitting: Pharmacist

## 2019-07-31 DIAGNOSIS — Z952 Presence of prosthetic heart valve: Secondary | ICD-10-CM | POA: Diagnosis not present

## 2019-07-31 DIAGNOSIS — Z7901 Long term (current) use of anticoagulants: Secondary | ICD-10-CM

## 2019-08-04 ENCOUNTER — Other Ambulatory Visit: Payer: Self-pay | Admitting: Pulmonary Disease

## 2019-08-19 ENCOUNTER — Other Ambulatory Visit: Payer: Self-pay | Admitting: Internal Medicine

## 2019-08-27 ENCOUNTER — Telehealth: Payer: Self-pay

## 2019-08-27 LAB — POCT INR: INR: 3.5 — AB (ref 2.0–3.0)

## 2019-08-27 NOTE — Telephone Encounter (Signed)
lmom for overdue inr 

## 2019-08-28 ENCOUNTER — Ambulatory Visit (INDEPENDENT_AMBULATORY_CARE_PROVIDER_SITE_OTHER): Payer: Medicare Other | Admitting: Pharmacist

## 2019-08-28 DIAGNOSIS — Z7901 Long term (current) use of anticoagulants: Secondary | ICD-10-CM

## 2019-08-28 DIAGNOSIS — Z952 Presence of prosthetic heart valve: Secondary | ICD-10-CM | POA: Diagnosis not present

## 2019-08-31 ENCOUNTER — Encounter (HOSPITAL_COMMUNITY): Payer: Self-pay

## 2019-08-31 ENCOUNTER — Ambulatory Visit (INDEPENDENT_AMBULATORY_CARE_PROVIDER_SITE_OTHER)
Admission: EM | Admit: 2019-08-31 | Discharge: 2019-08-31 | Disposition: A | Discharge status: Home / Self Care | Payer: Medicare Other | Source: Home / Self Care | Attending: Internal Medicine | Admitting: Internal Medicine

## 2019-08-31 ENCOUNTER — Other Ambulatory Visit: Payer: Self-pay

## 2019-08-31 ENCOUNTER — Encounter (HOSPITAL_COMMUNITY): Payer: Self-pay | Admitting: Emergency Medicine

## 2019-08-31 ENCOUNTER — Emergency Department (HOSPITAL_COMMUNITY): Payer: Medicare Other

## 2019-08-31 ENCOUNTER — Emergency Department (HOSPITAL_COMMUNITY)
Admission: EM | Admit: 2019-08-31 | Discharge: 2019-09-01 | Disposition: A | Discharge status: Home / Self Care | Payer: Medicare Other | Attending: Emergency Medicine | Admitting: Emergency Medicine

## 2019-08-31 DIAGNOSIS — T45515A Adverse effect of anticoagulants, initial encounter: Secondary | ICD-10-CM

## 2019-08-31 DIAGNOSIS — K922 Gastrointestinal hemorrhage, unspecified: Secondary | ICD-10-CM | POA: Diagnosis not present

## 2019-08-31 DIAGNOSIS — F17210 Nicotine dependence, cigarettes, uncomplicated: Secondary | ICD-10-CM | POA: Diagnosis not present

## 2019-08-31 DIAGNOSIS — K2900 Acute gastritis without bleeding: Secondary | ICD-10-CM | POA: Diagnosis not present

## 2019-08-31 DIAGNOSIS — Z952 Presence of prosthetic heart valve: Secondary | ICD-10-CM | POA: Diagnosis not present

## 2019-08-31 DIAGNOSIS — Z79899 Other long term (current) drug therapy: Secondary | ICD-10-CM | POA: Insufficient documentation

## 2019-08-31 DIAGNOSIS — R079 Chest pain, unspecified: Secondary | ICD-10-CM | POA: Insufficient documentation

## 2019-08-31 DIAGNOSIS — R1010 Upper abdominal pain, unspecified: Secondary | ICD-10-CM | POA: Diagnosis present

## 2019-08-31 DIAGNOSIS — R58 Hemorrhage, not elsewhere classified: Secondary | ICD-10-CM | POA: Diagnosis not present

## 2019-08-31 DIAGNOSIS — R791 Abnormal coagulation profile: Secondary | ICD-10-CM | POA: Diagnosis not present

## 2019-08-31 DIAGNOSIS — I10 Essential (primary) hypertension: Secondary | ICD-10-CM | POA: Diagnosis not present

## 2019-08-31 LAB — BASIC METABOLIC PANEL
Anion gap: 13 (ref 5–15)
BUN: 11 mg/dL (ref 6–20)
CO2: 20 mmol/L — ABNORMAL LOW (ref 22–32)
Calcium: 10.2 mg/dL (ref 8.9–10.3)
Chloride: 103 mmol/L (ref 98–111)
Creatinine, Ser: 1.14 mg/dL (ref 0.61–1.24)
GFR calc Af Amer: >60 mL/min (ref >60)
GFR calc non Af Amer: >60 mL/min (ref >60)
Glucose, Bld: 98 mg/dL (ref 70–99)
Potassium: 4.5 mmol/L (ref 3.5–5.1)
Sodium: 136 mmol/L (ref 135–145)

## 2019-08-31 LAB — CBC
HCT: 50.9 % (ref 39.0–52.0)
Hemoglobin: 17.6 g/dL — ABNORMAL HIGH (ref 13.0–17.0)
MCH: 30.8 pg (ref 26.0–34.0)
MCHC: 34.6 g/dL (ref 30.0–36.0)
MCV: 89.1 fL (ref 80.0–100.0)
Platelets: 260 10*3/uL (ref 150–400)
RBC: 5.71 MIL/uL (ref 4.22–5.81)
RDW: 13.1 % (ref 11.5–15.5)
WBC: 9.9 10*3/uL (ref 4.0–10.5)
nRBC: 0 % (ref 0.0–0.2)

## 2019-08-31 LAB — TROPONIN I (HIGH SENSITIVITY): Troponin I (High Sensitivity): 5 ng/L (ref <18)

## 2019-08-31 MED ORDER — SODIUM CHLORIDE 0.9% FLUSH: 3.0000 mL | Freq: Once | INTRAVENOUS | Status: DC

## 2019-08-31 NOTE — ED Triage Notes (Signed)
Pt here for evaluation of chest pain and possible bleeding ulcers sent from [facility]. Pt states this started on Friday. Pt is on blood thinners (warfarin). Pt has a history of an ablation in the past.

## 2019-08-31 NOTE — ED Notes (Signed)
I took the vitals sign to the pt and forgot to add it.

## 2019-08-31 NOTE — ED Triage Notes (Addendum)
Pt presents to the UC with right ear pain x 3-4 months, pt states is related to sinus infection and allergies as he is not taking the Claritin anymore. Pt states having abdominal pain x 3 months aprox.Pt states the abdominal pain is worsened with food.

## 2019-08-31 NOTE — ED Provider Notes (Signed)
[place]    CSN: [id number] Arrival date & time: 08/31/19  1528      History   Chief Complaint Chief Complaint  Patient presents with   Otalgia   Abdominal Pain    HPI Max Ward is a 48 y.o. male with a history of mechanical aortic valve currently on Coumadin comes to urgent care with complaints of epigastric abdominal pain of 3 months duration.  Over the past few days abdominal pain has worsened.  Abdominal pain is in the epigastric region of moderate severity.  It is worsened with oral intake.  No known relieving factors.  Patient's appetite has been poor over the past 12 to 24 hours because of the abdominal pain.  He admits to having 1 black tarry stool 3 days ago.  He complains of some dizziness and lightheadedness in the urgent care.  Patient also complains of right ear pain over the past 3 to 4 months.  No fever or chills.  No difficulty hearing or tinnitus.  Patient checked his INR on Friday and it was 3.5.  Patient held off taking his Coumadin.  He has not checked INR since then.  HPI  Past Medical History:  Diagnosis Date   Anxiety    Asthma    Bipolar 1 disorder ([place])    Complication of anesthesia    woke up during colonoscoy   DDD (degenerative disc disease), cervical    Depression    DJD (degenerative joint disease)    Hypertension    SVT (supraventricular tachycardia) (HCC)    s/p prior ablation by Dr Seabron Spates at [facility] [place]    Patient Active Problem List   Diagnosis Date Noted   Ganglion cyst of volar aspect of right wrist 09/03/2018   Moderate persistent asthma 08/05/2018   PVC (premature ventricular contraction) 07/14/2018   SOB (shortness of breath) 07/14/2018   Finger pain, right 06/24/2018   Pain in right elbow 06/24/2018   Pain in right wrist 06/24/2018   Ganglion cyst 12/19/2017   Finger injury, right, sequela 12/19/2017   Palpitations 08/29/2017   Dental caries 06/27/2017   Chronic pain of right  knee 03/13/2017   Abnormal coagulation profile 07/11/2016   Dysfunction of right eustachian tube 07/11/2016   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 07/11/2016   Right ear pain 07/11/2016   Mixed hyperlipidemia 02/21/2016   Seasonal allergic rhinitis due to pollen 02/21/2016   Encounter for therapeutic drug monitoring 02/16/2016   Common migraine 02/15/2016   High risk medication use 02/15/2016   History of traumatic head injury 02/15/2016   Low back pain 02/15/2016   Neck pain 02/15/2016   Status post mechanical aortic valve replacement 11/25/2015   ADHD (attention deficit hyperactivity disorder) 10/18/2015   Allergic rhinitis 10/18/2015   Anemia 10/18/2015   Asthma 10/18/2015   GERD (gastroesophageal reflux disease) 10/18/2015   Herniated nucleus pulposus, L4-5 10/18/2015   Insomnia 10/18/2015   Pain syndrome, chronic 10/18/2015   SVT (supraventricular tachycardia) ([place]) 10/18/2015   Essential hypertension 05/30/2015   Long term (current) use of anticoagulants 05/30/2015   Postural dizziness with presyncope 05/30/2015   Bipolar affective disorder ([place]) 08/09/2011   Chest pain 08/09/2011   Anxiety 08/09/2011   Depressed affect 08/09/2011   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 Authorizing Provider  ADVAIR DISKUS 250-50 MCG/DOSE AEPB INHALE 1 PUFF BY MOUTH TWICE A DAY 08/04/19    Margaretha Seeds, MD  albuterol (PROVENTIL HFA;VENTOLIN HFA) 108 (90 Base) MCG/ACT inhaler INHALE 1-2 PUFFS BY MOUTH EVERY 4-6 HOURS AS NEEDED AND AS DIRECTED 08/21/18   Olalere, Adewale A, MD  albuterol (PROVENTIL) (2.5 MG/3ML) 0.083% nebulizer solution Take 3 mLs (2.5 mg total) by nebulization every 6 (six) hours as needed for wheezing or shortness of breath. 09/09/18   Margaretha Seeds, MD  alprazolam Duanne Moron) 2 MG tablet Take 2 mg by mouth 4 (four) times daily as needed for sleep.    [provider]  Asenapine Maleate (SAPHRIS) 10 MG SUBL Place 30 mg under the tongue at bedtime.    [provider]  azelastine (ASTELIN) 0.1 % nasal spray Place 1 spray into both nostrils 2 (two) times daily. Use in each nostril as directed    [provider]  enoxaparin (LOVENOX) 100 MG/ML injection Inject 1.35 mLs (135 mg total) into the skin daily for 5 days. 09/01/19 09/06/19  Sherwood Gambler, MD  famotidine (PEPCID) 20 MG tablet Take 1 tablet (20 mg total) by mouth 2 (two) times daily. 09/01/19   Sherwood Gambler, MD  flecainide (TAMBOCOR) 50 MG tablet Take 1 tablet (50 mg total) by mouth 2 (two) times daily. 06/10/19   Allred, Jeneen Rinks, MD  fluticasone (FLONASE) 50 MCG/ACT nasal spray Place 1 spray into both nostrils daily. 10/31/18   Margaretha Seeds, MD  hydrOXYzine (VISTARIL) 50 MG capsule Take 1 capsule by mouth as needed for anxiety. 01/01/19   [provider]  metoprolol succinate (TOPROL-XL) 50 MG 24 hr tablet Take 50 mg by mouth daily. Take with or immediately following a meal.     [provider]  montelukast (SINGULAIR) 10 MG tablet TAKE 1 TABLET BY MOUTH EVERY DAY 07/06/19   Margaretha Seeds, MD  pantoprazole (PROTONIX) 40 MG tablet Take 1 tablet (40 mg total) by mouth daily. 09/01/19   Sherwood Gambler, MD  traMADol (ULTRAM) 50 MG tablet Take 1 tablet (50 mg total) by mouth 2 (two) times daily. 01/05/19   Leandrew Koyanagi, MD  traZODone (DESYREL) 50 MG tablet Take  200 mg by mouth at bedtime.    [provider]  warfarin (COUMADIN) 5 MG tablet Take 1 to 1 and 1/2 tablet daily as directed by coumadin clinic 07/16/19   Elouise Munroe, MD    Family History Family History  Problem Relation Age of Onset   Cancer Mother    Emphysema Father     Social History Social History   Tobacco Use   Smoking status: Current Every Day Smoker    Packs/day: 1.00    Years: 15.00    Pack years: 15.00    Types: Cigarettes   Smokeless tobacco: Never Used   Tobacco comment: 1/2 pack  or less per day Jan 2020  Substance Use Topics   Alcohol use: Yes   Drug use: No     Allergies   Molds &  smuts, Valium [diazepam], and Vicodin [hydrocodone-acetaminophen]   Review of Systems Review of Systems  Constitutional: Positive for activity change and fatigue. Negative for chills, diaphoresis and unexpected weight change.  HENT: Negative.   Respiratory: Negative for cough, chest tightness and shortness of breath.   Cardiovascular: Positive for palpitations.  Gastrointestinal: Positive for abdominal pain, blood in stool and nausea. Negative for anal bleeding, diarrhea, rectal pain and vomiting.  Endocrine: Negative.   Genitourinary: Negative.  Negative for dysuria, frequency, hematuria and scrotal swelling.  Musculoskeletal: Negative.  Negative for joint swelling and myalgias.  Skin: Negative for pallor, rash and wound.  Neurological: Positive for dizziness, weakness and light-headedness. Negative for syncope.  Psychiatric/Behavioral: The patient is nervous/anxious and is hyperactive.      Physical Exam Triage Vital Signs ED Triage Vitals [08/31/19 1733]  Enc Vitals Group     BP      Pulse      Resp      Temp      Temp src      SpO2      Weight      Height      Head Circumference      Peak Flow      Pain Score 3     Pain Loc      Pain Edu?      Excl. in [place]?    No data found.  Updated Vital Signs BP (!) 152/87 (BP Location: Right  Arm)    Pulse (!) 55    Temp 97.6 F (36.4 C) (Temporal)    Resp 18    SpO2 97%   Visual Acuity Right Eye Distance:   Left Eye Distance:   Bilateral Distance:    Right Eye Near:   Left Eye Near:    Bilateral Near:     Physical Exam Vitals signs and nursing note reviewed.  Constitutional:      General: He is in acute distress.     Appearance: He is well-developed. He is not ill-appearing or diaphoretic.  HENT:     Head: Atraumatic.     Mouth/Throat:     Mouth: Mucous membranes are moist.  Eyes:     General: No scleral icterus.    Extraocular Movements: Extraocular movements intact.     Pupils: Pupils are equal, round, and reactive to light.  Cardiovascular:     Rate and Rhythm: Normal rate and regular rhythm.     Heart sounds: Normal heart sounds. No murmur. No friction rub.     Comments: Mechanical valve clicks Pulmonary:     Effort: Pulmonary effort is normal. No respiratory distress.     Breath sounds: Normal breath sounds. No rhonchi.  Abdominal:     General: Abdomen is flat. Bowel sounds are normal. There is no abdominal bruit.     Palpations: Abdomen is soft. There is no hepatomegaly or splenomegaly.     Tenderness: There is abdominal tenderness in the epigastric area. There is no guarding or rebound.     Hernia: No hernia is present.  Skin:    General: Skin is warm.     Capillary Refill: Capillary refill takes less than 2 seconds.     Coloration: Skin is not mottled or pale.     Findings: No erythema.  Neurological:     General: No focal deficit present.     Mental Status: He is alert and oriented to person, place, and time.  Psychiatric:        Mood  and Affect: Mood is anxious.        Behavior: Behavior normal.      UC Treatments / Results  Labs (all labs ordered are listed, but only abnormal results are displayed) Labs Reviewed - No data to display  EKG   Radiology Dg Chest 2 View  Result Date: 08/31/2019 CLINICAL DATA:  Chest pain EXAM: CHEST -  2 VIEW COMPARISON:  06/29/2019 FINDINGS: Prior median sternotomy and valve replacement. Heart is normal size. Lungs clear. No effusions. No acute bony abnormality. IMPRESSION: No active cardiopulmonary disease. Electronically Signed   By: Rolm Baptise M.D.   On: 08/31/2019 21:57   Ct Abdomen Pelvis W Contrast  Result Date: 09/01/2019 CLINICAL DATA:  Abdominal pain with blood in stool EXAM: CT ABDOMEN AND PELVIS WITH CONTRAST TECHNIQUE: Multidetector CT imaging of the abdomen and pelvis was performed using the standard protocol following bolus administration of intravenous contrast. CONTRAST:  140mL OMNIPAQUE IOHEXOL 300 MG/ML  SOLN COMPARISON:  September 27, 2014. FINDINGS: Note that there is a degree of motion artifact. Lower chest: There is mild atelectatic change in the anterior left lung base. No edema or consolidation evident in the lung bases. Hepatobiliary: No focal liver lesions are appreciable. Gallbladder wall is not appreciably thickened. There is no biliary duct dilatation. Pancreas: There is no pancreatic mass or inflammatory focus. Spleen: No splenic lesions are evident. Adrenals/Urinary Tract: Adrenals bilaterally appear unremarkable. There is a 7 mm cyst in the mid left kidney laterally. There is no evident hydronephrosis on either side. There is no evident renal or ureteral calculus on either side. Urinary bladder is midline with wall thickness within normal limits. Stomach/Bowel: There is no appreciable bowel wall or mesenteric thickening. Terminal ileum appears unremarkable. No evident bowel obstruction. No free air or portal venous air evident. Vascular/Lymphatic: There is no abdominal aortic aneurysm. No vascular lesions are demonstrable. There is no evident adenopathy in the abdomen or pelvis. Reproductive: Prostate and seminal vesicles are normal in size and contour. No pelvic masses are evident. Apparent clips in upper right scrotal region. Other: The appendix appears normal. There is no  demonstrable abscess or ascites in the abdomen or pelvis. Musculoskeletal: Degenerative changes noted in each hip joint. No blastic or lytic bone lesions are evident. There is no intramuscular or abdominal wall lesion. IMPRESSION: 1.  Note that there is a degree of motion artifact. 2. No bowel wall thickening or bowel obstruction evident. No abscess in the abdomen or pelvis. Appendix appears normal. With respect to blood in stool, would advise direct colonic visualization after appropriate colonic cleansing. 3. No renal or ureteral calculi. No hydronephrosis. Urinary bladder wall thickness within normal limits. Electronically Signed   By: Lowella Grip III M.D.   On: 09/01/2019 09:52    Procedures Procedures (including critical care time)  Medications Ordered in UC Medications - No data to display  Initial Impression / Assessment and Plan / UC Course  I have reviewed the triage vital signs and the nursing notes.  Pertinent labs & imaging results that were available during my care of the patient were reviewed by me and considered in my medical decision making (see chart for details).     1.  Epigastric abdominal pain in the setting of Coumadin use: Patient has a history of gastritis and is likely having some GI bleed Given that the patient is anticoagulated and also had an episode of black tarry stools, patient is advised to go to ED to be evaluated further Patient  is currently feeling dizzy and lightheaded.  This may be due to GI blood loss or dehydration.  Patient is agreeable.  2.  Right ear pain: Right ear exam is unremarkable. Final Clinical Impressions(s) / UC Diagnoses   Final diagnoses:  Acute upper GI bleed  Bleeding on Coumadin   Discharge Instructions   None    ED Prescriptions    None     PDMP not reviewed this encounter.   Chase Picket, MD 09/02/19 1039

## 2019-09-01 ENCOUNTER — Emergency Department (HOSPITAL_COMMUNITY): Payer: Medicare Other

## 2019-09-01 LAB — HEPATIC FUNCTION PANEL
ALT: 35 U/L (ref 0–44)
AST: 23 U/L (ref 15–41)
Albumin: 4.4 g/dL (ref 3.5–5.0)
Alkaline Phosphatase: 64 U/L (ref 38–126)
Bilirubin, Direct: 0.2 mg/dL (ref 0.0–0.2)
Indirect Bilirubin: 1.3 mg/dL — ABNORMAL HIGH (ref 0.3–0.9)
Total Bilirubin: 1.5 mg/dL — ABNORMAL HIGH (ref 0.3–1.2)
Total Protein: 7.2 g/dL (ref 6.5–8.1)

## 2019-09-01 LAB — CBC
HCT: 47.8 % (ref 39.0–52.0)
Hemoglobin: 16.4 g/dL (ref 13.0–17.0)
MCH: 30.8 pg (ref 26.0–34.0)
MCHC: 34.3 g/dL (ref 30.0–36.0)
MCV: 89.7 fL (ref 80.0–100.0)
Platelets: 240 10*3/uL (ref 150–400)
RBC: 5.33 MIL/uL (ref 4.22–5.81)
RDW: 13.2 % (ref 11.5–15.5)
WBC: 10 10*3/uL (ref 4.0–10.5)
nRBC: 0 % (ref 0.0–0.2)

## 2019-09-01 LAB — TROPONIN I (HIGH SENSITIVITY)
Troponin I (High Sensitivity): 4 ng/L (ref <18)
Troponin I (High Sensitivity): 3 ng/L (ref <18)

## 2019-09-01 LAB — PROTIME-INR
INR: 1.2 (ref 0.8–1.2)
Prothrombin Time: 15 seconds (ref 11.4–15.2)

## 2019-09-01 LAB — LIPASE, BLOOD: Lipase: 14 U/L (ref 11–51)

## 2019-09-01 LAB — POC OCCULT BLOOD, ED: Fecal Occult Bld: NEGATIVE

## 2019-09-01 MED ORDER — ENOXAPARIN SODIUM 100 MG/ML ~~LOC~~ SOLN: 1.5000 mg/kg | SUBCUTANEOUS | 0 refills | Status: DC

## 2019-09-01 MED ORDER — ENOXAPARIN SODIUM 150 MG/ML ~~LOC~~ SOLN
1.5000 mg/kg | Freq: Once | SUBCUTANEOUS | Status: AC
  Administered 2019-09-01: 135 mg via SUBCUTANEOUS
  Filled 2019-09-01: qty 0.9

## 2019-09-01 MED ORDER — FENTANYL CITRATE (PF) 100 MCG/2ML IJ SOLN
100.0000 ug | Freq: Once | INTRAMUSCULAR | Status: AC
  Administered 2019-09-01: 100 ug via INTRAVENOUS
  Filled 2019-09-01: qty 2

## 2019-09-01 MED ORDER — PANTOPRAZOLE SODIUM 40 MG PO TBEC: 40.0000 mg | DELAYED_RELEASE_TABLET | Freq: Every day | ORAL | 0 refills | Status: DC

## 2019-09-01 MED ORDER — PANTOPRAZOLE SODIUM 40 MG IV SOLR
40.0000 mg | Freq: Once | INTRAVENOUS | Status: AC
  Administered 2019-09-01: 40 mg via INTRAVENOUS
  Filled 2019-09-01: qty 40

## 2019-09-01 MED ORDER — IOHEXOL 300 MG/ML  SOLN
100.0000 mL | Freq: Once | INTRAMUSCULAR | Status: AC | PRN
  Administered 2019-09-01: 100 mL via INTRAVENOUS

## 2019-09-01 MED ORDER — FAMOTIDINE 20 MG PO TABS: 20.0000 mg | ORAL_TABLET | Freq: Two times a day (BID) | ORAL | 0 refills | Status: DC

## 2019-09-01 NOTE — Discharge Instructions (Addendum)
If you develop worsening, continued, or recurrent abdominal pain, uncontrolled vomiting, fever, chest or back pain, or any other new/concerning symptoms then return to the ER for evaluation.   Take 10 mg warfarin tonight (09/01/2019). You were given your first dose of lovenox this morning, take the next dose tomorrow. Call your warfarin clinic today for an appointment this week to recheck your INR

## 2019-09-01 NOTE — ED Provider Notes (Signed)
[facility] EMERGENCY DEPARTMENT Provider Note   CSN: [id number] Arrival date & time: 08/31/19  1932     History   Chief Complaint Chief Complaint  Patient presents with   Chest Pain   bleeding ulcers    HPI Max Ward is a 48 y.o. male.     HPI 48 year old male presents with a chief complaint of abdominal pain.  He states that he has had upper abdominal pain for about 4 or 5 days.  Started after eating pulled pork at [employer].  Feels like a burning.  He has a longstanding history of reflux and has been having reflux symptoms as well. Has been having dark stools that are soft as well. These stopped a couple days ago.  He has also been having lower abdominal pain for months.  While in the waiting room overnight he developed chest pain though this has improved.  The abdominal pain is gone from a 10 down to an 8.  He has been off of his stomach medicine because his PCP would not refill them. Has felt dizzy. He is on warfarin for a mechanical heart valve and thinks his INR was too high last week.  Past Medical History:  Diagnosis Date   Anxiety    Asthma    Bipolar 1 disorder ([place])    Complication of anesthesia    woke up during colonoscoy   DDD (degenerative disc disease), cervical    Depression    DJD (degenerative joint disease)    Hypertension    SVT (supraventricular tachycardia) ([place])    s/p prior ablation by Dr Seabron Spates at [facility]    Patient Active Problem List   Diagnosis Date Noted   Ganglion cyst of volar aspect of right wrist 09/03/2018   Moderate persistent asthma 08/05/2018   PVC (premature ventricular contraction) 07/14/2018   SOB (shortness of breath) 07/14/2018   Finger pain, right 06/24/2018   Pain in right elbow 06/24/2018   Pain in right wrist 06/24/2018   Ganglion cyst 12/19/2017   Finger injury, right, sequela 12/19/2017   Palpitations 08/29/2017   Dental caries 06/27/2017   Chronic pain of  right knee 03/13/2017   Abnormal coagulation profile 07/11/2016   Dysfunction of right eustachian tube 07/11/2016   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 07/11/2016   Right ear pain 07/11/2016   Mixed hyperlipidemia 02/21/2016   Seasonal allergic rhinitis due to pollen 02/21/2016   Encounter for therapeutic drug monitoring 02/16/2016   Common migraine 02/15/2016   High risk medication use 02/15/2016   History of traumatic head injury 02/15/2016   Low back pain 02/15/2016   Neck pain 02/15/2016   Status post mechanical aortic valve replacement 11/25/2015   ADHD (attention deficit hyperactivity disorder) 10/18/2015   Allergic rhinitis 10/18/2015   Anemia 10/18/2015   Asthma 10/18/2015   GERD (gastroesophageal reflux disease) 10/18/2015   Herniated nucleus pulposus, L4-5 10/18/2015   Insomnia 10/18/2015   Pain syndrome, chronic 10/18/2015   SVT (supraventricular tachycardia) ([place]) 10/18/2015   Essential hypertension 05/30/2015   Long term (current) use of anticoagulants 05/30/2015   Postural dizziness with presyncope 05/30/2015   Bipolar affective disorder ([place]) 08/09/2011   Chest pain 08/09/2011   Anxiety 08/09/2011   Depressed affect 08/09/2011   Permanent form of junctional reciprocating tachycardia ([place]) 08/09/2011   Tobacco use 08/09/2011    Past Surgical History:  Procedure Laterality Date   BONE TUMOR EXCISION     skull   CARDIAC CATHETERIZATION  2018  [facility] reveals normal cors   GANGLION CYST EXCISION Right 09/03/2018   Procedure: REMOVAL GANGLION OF RIGHT WRIST AND RIGHT ELBOW;  Surgeon: Leandrew Koyanagi, MD;  Location: [place];  Service: Orthopedics;  Laterality: Right;  OTHER SITE IS ELBOW   MECHANICAL AORTIC VALVE REPLACEMENT          Home Medications    Prior to Admission medications   Medication Sig Start Date End Date Taking? Authorizing Provider  ADVAIR DISKUS 250-50 MCG/DOSE AEPB INHALE 1 PUFF BY MOUTH TWICE A DAY  08/04/19   Margaretha Seeds, MD  albuterol (PROVENTIL HFA;VENTOLIN HFA) 108 (90 Base) MCG/ACT inhaler INHALE 1-2 PUFFS BY MOUTH EVERY 4-6 HOURS AS NEEDED AND AS DIRECTED 08/21/18   Olalere, Adewale A, MD  albuterol (PROVENTIL) (2.5 MG/3ML) 0.083% nebulizer solution Take 3 mLs (2.5 mg total) by nebulization every 6 (six) hours as needed for wheezing or shortness of breath. 09/09/18   Margaretha Seeds, MD  alprazolam Duanne Moron) 2 MG tablet Take 2 mg by mouth 4 (four) times daily as needed for sleep.    [provider]  Asenapine Maleate (SAPHRIS) 10 MG SUBL Place 30 mg under the tongue at bedtime.    [provider]  azelastine (ASTELIN) 0.1 % nasal spray Place 1 spray into both nostrils 2 (two) times daily. Use in each nostril as directed    [provider]  enoxaparin (LOVENOX) 100 MG/ML injection Inject 1.35 mLs (135 mg total) into the skin daily for 5 days. 09/01/19 09/06/19  Sherwood Gambler, MD  famotidine (PEPCID) 20 MG tablet Take 1 tablet (20 mg total) by mouth 2 (two) times daily. 09/01/19   Sherwood Gambler, MD  flecainide (TAMBOCOR) 50 MG tablet Take 1 tablet (50 mg total) by mouth 2 (two) times daily. 06/10/19   Allred, Jeneen Rinks, MD  fluticasone (FLONASE) 50 MCG/ACT nasal spray Place 1 spray into both nostrils daily. 10/31/18   Margaretha Seeds, MD  hydrOXYzine (VISTARIL) 50 MG capsule Take 1 capsule by mouth as needed for anxiety. 01/01/19   [provider]  metoprolol succinate (TOPROL-XL) 50 MG 24 hr tablet Take 50 mg by mouth daily. Take with or immediately following a meal.     [provider]  montelukast (SINGULAIR) 10 MG tablet TAKE 1 TABLET BY MOUTH EVERY DAY 07/06/19   Margaretha Seeds, MD  pantoprazole (PROTONIX) 40 MG tablet Take 1 tablet (40 mg total) by mouth daily. 09/01/19   Sherwood Gambler, MD  traMADol (ULTRAM) 50 MG tablet Take 1 tablet (50 mg total) by mouth 2 (two) times daily. 01/05/19   Leandrew Koyanagi, MD  traZODone (DESYREL) 50 MG  tablet Take 200 mg by mouth at bedtime.    [provider]  warfarin (COUMADIN) 5 MG tablet Take 1 to 1 and 1/2 tablet daily as directed by coumadin clinic 07/16/19   Elouise Munroe, MD    Family History Family History  Problem Relation Age of Onset   Cancer Mother    Emphysema Father     Social History Social History   Tobacco Use   Smoking status: Current Every Day Smoker    Packs/day: 1.00    Years: 15.00    Pack years: 15.00    Types: Cigarettes   Smokeless tobacco: Never Used   Tobacco comment: 1/2 pack  or less per day Jan 2020  Substance Use Topics   Alcohol use: Yes   Drug use: No     Allergies  Molds & smuts, Valium [diazepam], and Vicodin [hydrocodone-acetaminophen]   Review of Systems Review of Systems  Cardiovascular: Positive for chest pain.  Gastrointestinal: Positive for abdominal pain. Negative for vomiting.  Musculoskeletal: Positive for back pain (chronic).  Neurological: Positive for dizziness.  All other systems reviewed and are negative.    Physical Exam Updated Vital Signs BP (!) 145/95    Pulse 76    Temp 98.1 F (36.7 C) (Oral)    Resp 16    Ht 5\' 8"  (1.727 m)    Wt 90.7 kg    SpO2 97%    BMI 30.41 kg/m   Physical Exam Vitals signs and nursing note reviewed.  Constitutional:      Appearance: He is well-developed.  HENT:     Head: Normocephalic and atraumatic.     Right Ear: External ear normal.     Left Ear: External ear normal.     Nose: Nose normal.  Eyes:     General:        Right eye: No discharge.        Left eye: No discharge.  Neck:     Musculoskeletal: Neck supple.  Cardiovascular:     Rate and Rhythm: Normal rate and regular rhythm.     Heart sounds: Normal heart sounds.  Pulmonary:     Effort: Pulmonary effort is normal.     Breath sounds: Normal breath sounds.  Abdominal:     Palpations: Abdomen is soft.     Tenderness: There is generalized abdominal tenderness (but worst in the upper  abdomen).  Genitourinary:    Rectum: No mass, tenderness, external hemorrhoid or internal hemorrhoid.     Comments: Minimal light brown stool on DRE. No gross blood or melena. Skin:    General: Skin is warm and dry.  Neurological:     Mental Status: He is alert.  Psychiatric:        Mood and Affect: Mood is not anxious.      ED Treatments / Results  Labs (all labs ordered are listed, but only abnormal results are displayed) Labs Reviewed  BASIC METABOLIC PANEL - Abnormal; Notable for the following components:      Result Value   CO2 20 (*)    All other components within normal limits  CBC - Abnormal; Notable for the following components:   Hemoglobin 17.6 (*)    All other components within normal limits  HEPATIC FUNCTION PANEL - Abnormal; Notable for the following components:   Total Bilirubin 1.5 (*)    Indirect Bilirubin 1.3 (*)    All other components within normal limits  PROTIME-INR  CBC  LIPASE, BLOOD  POC OCCULT BLOOD, ED  TROPONIN I (HIGH SENSITIVITY)  TROPONIN I (HIGH SENSITIVITY)  TROPONIN I (HIGH SENSITIVITY)    EKG EKG Interpretation  Date/Time:  Monday August 31 2019 22:43:27 EST Ventricular Rate:  99 PR Interval:  152 QRS Duration: 90 QT Interval:  362 QTC Calculation: 464 R Axis:   66 Text Interpretation: Sinus rhythm with frequent Premature ventricular complexes in a pattern of bigeminy Right atrial enlargement ST & T wave abnormality, consider inferolateral ischemia Prolonged QT Abnormal ECG When compared with ECG of EARLIER SAME DATE No significant change was found Confirmed by Delora Fuel ([id number]) on 09/01/2019 1:47:38 AM   Radiology Dg Chest 2 View  Result Date: 08/31/2019 CLINICAL DATA:  Chest pain EXAM: CHEST - 2 VIEW COMPARISON:  06/29/2019 FINDINGS: Prior median sternotomy and valve replacement. Heart is normal size.  Lungs clear. No effusions. No acute bony abnormality. IMPRESSION: No active cardiopulmonary disease. Electronically  Signed   By: Rolm Baptise M.D.   On: 08/31/2019 21:57   Ct Abdomen Pelvis W Contrast  Result Date: 09/01/2019 CLINICAL DATA:  Abdominal pain with blood in stool EXAM: CT ABDOMEN AND PELVIS WITH CONTRAST TECHNIQUE: Multidetector CT imaging of the abdomen and pelvis was performed using the standard protocol following bolus administration of intravenous contrast. CONTRAST:  175mL OMNIPAQUE IOHEXOL 300 MG/ML  SOLN COMPARISON:  September 27, 2014. FINDINGS: Note that there is a degree of motion artifact. Lower chest: There is mild atelectatic change in the anterior left lung base. No edema or consolidation evident in the lung bases. Hepatobiliary: No focal liver lesions are appreciable. Gallbladder wall is not appreciably thickened. There is no biliary duct dilatation. Pancreas: There is no pancreatic mass or inflammatory focus. Spleen: No splenic lesions are evident. Adrenals/Urinary Tract: Adrenals bilaterally appear unremarkable. There is a 7 mm cyst in the mid left kidney laterally. There is no evident hydronephrosis on either side. There is no evident renal or ureteral calculus on either side. Urinary bladder is midline with wall thickness within normal limits. Stomach/Bowel: There is no appreciable bowel wall or mesenteric thickening. Terminal ileum appears unremarkable. No evident bowel obstruction. No free air or portal venous air evident. Vascular/Lymphatic: There is no abdominal aortic aneurysm. No vascular lesions are demonstrable. There is no evident adenopathy in the abdomen or pelvis. Reproductive: Prostate and seminal vesicles are normal in size and contour. No pelvic masses are evident. Apparent clips in upper right scrotal region. Other: The appendix appears normal. There is no demonstrable abscess or ascites in the abdomen or pelvis. Musculoskeletal: Degenerative changes noted in each hip joint. No blastic or lytic bone lesions are evident. There is no intramuscular or abdominal wall lesion.  IMPRESSION: 1.  Note that there is a degree of motion artifact. 2. No bowel wall thickening or bowel obstruction evident. No abscess in the abdomen or pelvis. Appendix appears normal. With respect to blood in stool, would advise direct colonic visualization after appropriate colonic cleansing. 3. No renal or ureteral calculi. No hydronephrosis. Urinary bladder wall thickness within normal limits. Electronically Signed   By: Lowella Grip III M.D.   On: 09/01/2019 09:52    Procedures Procedures (including critical care time)  Medications Ordered in ED Medications  sodium chloride flush (NS) 0.9 % injection 3 mL (3 mLs Intravenous Not Given 09/01/19 0820)  enoxaparin (LOVENOX) injection 135 mg (has no administration in time range)  fentaNYL (SUBLIMAZE) injection 100 mcg (100 mcg Intravenous Given 09/01/19 0838)  pantoprazole (PROTONIX) injection 40 mg (40 mg Intravenous Given 09/01/19 0838)  iohexol (OMNIPAQUE) 300 MG/ML solution 100 mL (100 mLs Intravenous Contrast Given 09/01/19 0900)     Initial Impression / Assessment and Plan / ED Course  I have reviewed the triage vital signs and the nursing notes.  Pertinent labs & imaging results that were available during my care of the patient were reviewed by me and considered in my medical decision making (see chart for details).        The patient is well-appearing.  As for his chest pain, he had benign cardiac cath in 2018 according to outside records.  With troponins negative x2, ECG the does not appear ischemic, I have low suspicion this is ACS.  Highly doubt PE or dissection.  His abdominal pain in the lower quadrants has been going on for quite some time.  CT  is benign.  Upper abdominal pain does sound like gastritis/reflux and he does not have any current melena.  Hemoglobin from 12 hours ago was 17 and now is 16.  I think likely he was hemoconcentrated at first but either way, I do not think he is having significant GI bleeding.  He is  noted to have subtherapeutic INR and I discussed with pharmacy and at this point is recommended to bridge with 1.5 mg/kg of Lovenox every 24 hours.  Given first dose today.  Also increase his warfarin to 10 mg tonight and then back to normal schedule.  Needs to follow-up closely with Coumadin clinic.  He does endorse that he was told to hold his Coumadin 1 night because of the 3.5 INR but he thinks he might of accidentally forgot it a couple times.  At this point he appears stable for discharge home.  Final Clinical Impressions(s) / ED Diagnoses   Final diagnoses:  Upper abdominal pain  Acute gastritis without hemorrhage, unspecified gastritis type  Subtherapeutic international normalized ratio (INR)    ED Discharge Orders         Ordered    enoxaparin (LOVENOX) 100 MG/ML injection  Every 24 hours     09/01/19 1002    pantoprazole (PROTONIX) 40 MG tablet  Daily     09/01/19 1002    famotidine (PEPCID) 20 MG tablet  2 times daily     09/01/19 1002           Sherwood Gambler, MD 09/01/19 1015

## 2019-09-01 NOTE — ED Notes (Signed)
Patient verbalizes understanding of discharge instructions . Opportunity for questions and answers were provided . Armband removed by staff ,Pt discharged from ED. W/C  offered at D/C  and Declined W/C at D/C and was escorted to lobby by RN.  

## 2019-09-01 NOTE — ED Notes (Signed)
Pt verbalized understanding of d/c instruction and has no further questions, VSS, NAD.

## 2019-09-07 ENCOUNTER — Other Ambulatory Visit: Payer: Self-pay

## 2019-09-07 ENCOUNTER — Encounter: Payer: Self-pay | Admitting: Nurse Practitioner

## 2019-09-08 ENCOUNTER — Encounter: Payer: Self-pay | Admitting: Family Medicine

## 2019-09-08 ENCOUNTER — Ambulatory Visit (INDEPENDENT_AMBULATORY_CARE_PROVIDER_SITE_OTHER): Payer: Medicare Other | Admitting: Family Medicine

## 2019-09-08 VITALS — BP 120/76 | HR 67 | Temp 97.2°F | Ht 68.0 in | Wt 199.0 lb

## 2019-09-08 DIAGNOSIS — K219 Gastro-esophageal reflux disease without esophagitis: Secondary | ICD-10-CM | POA: Diagnosis not present

## 2019-09-08 DIAGNOSIS — Z720 Tobacco use: Secondary | ICD-10-CM | POA: Diagnosis not present

## 2019-09-08 DIAGNOSIS — Z09 Encounter for follow-up examination after completed treatment for conditions other than malignant neoplasm: Secondary | ICD-10-CM | POA: Insufficient documentation

## 2019-09-08 MED ORDER — OMEPRAZOLE 40 MG PO CPDR: DELAYED_RELEASE_CAPSULE | ORAL | 3 refills | Status: DC

## 2019-09-08 MED ORDER — PANTOPRAZOLE SODIUM 40 MG PO TBEC: 40.0000 mg | DELAYED_RELEASE_TABLET | Freq: Every day | ORAL | 3 refills | Status: DC

## 2019-09-08 NOTE — Patient Instructions (Signed)
Food Choices for Gastroesophageal Reflux Disease, Adult When you have gastroesophageal reflux disease (GERD), the foods you eat and your eating habits are very important. Choosing the right foods can help ease your discomfort. Think about working with a nutrition specialist (dietitian) to help you make good choices. What are tips for following this plan?  Meals  Choose healthy foods that are low in fat, such as fruits, vegetables, whole grains, low-fat dairy products, and lean meat, fish, and poultry.  Eat small meals often instead of 3 large meals a day. Eat your meals slowly, and in a place where you are relaxed. Avoid bending over or lying down until 2-3 hours after eating.  Avoid eating meals 2-3 hours before bed.  Avoid drinking a lot of liquid with meals.  Cook foods using methods other than frying. Bake, grill, or broil food instead.  Avoid or limit: ? Chocolate. ? Peppermint or spearmint. ? Alcohol. ? Pepper. ? Black and decaffeinated coffee. ? Black and decaffeinated tea. ? Bubbly (carbonated) soft drinks. ? Caffeinated energy drinks and soft drinks.  Limit high-fat foods such as: ? Fatty meat or fried foods. ? Whole milk, cream, butter, or ice cream. ? Nuts and nut butters. ? Pastries, donuts, and sweets made with butter or shortening.  Avoid foods that cause symptoms. These foods may be different for everyone. Common foods that cause symptoms include: ? Tomatoes. ? Oranges, lemons, and limes. ? Peppers. ? Spicy food. ? Onions and garlic. ? Vinegar. Lifestyle  Maintain a healthy weight. Ask your doctor what weight is healthy for you. If you need to lose weight, work with your doctor to do so safely.  Exercise for at least 30 minutes for 5 or more days each week, or as told by your doctor.  Wear loose-fitting clothes.  Do not smoke. If you need help quitting, ask your doctor.  Sleep with the head of your bed higher than your feet. Use a wedge under the  mattress or blocks under the bed frame to raise the head of the bed. Summary  When you have gastroesophageal reflux disease (GERD), food and lifestyle choices are very important in easing your symptoms.  Eat small meals often instead of 3 large meals a day. Eat your meals slowly, and in a place where you are relaxed.  Limit high-fat foods such as fatty meat or fried foods.  Avoid bending over or lying down until 2-3 hours after eating.  Avoid peppermint and spearmint, caffeine, alcohol, and chocolate. This information is not intended to replace advice given to you by your health care provider. Make sure you discuss any questions you have with your health care provider. Document Released: 04/01/2012 Document Revised: 01/22/2019 Document Reviewed: 11/06/2016 Elsevier Patient Education  2020 [employer].  Steps to Quit Smoking Smoking tobacco is the leading cause of preventable death. It can affect almost every organ in the body. Smoking puts you and those around you at risk for developing many serious chronic diseases. Quitting smoking can be difficult, but it is one of the best things that you can do for your health. It is never too late to quit. How do I get ready to quit? When you decide to quit smoking, create a plan to help you succeed. Before you quit:  Pick a date to quit. Set a date within the next 2 weeks to give you time to prepare.  Write down the reasons why you are quitting. Keep this list in places where you will see it  often.  Tell your family, friends, and co-workers that you are quitting. Support from your loved ones can make quitting easier.  Talk with your health care provider about your options for quitting smoking.  Find out what treatment options are covered by your health insurance.  Identify people, places, things, and activities that make you want to smoke (triggers). Avoid them. What first steps can I take to quit smoking?  Throw away all cigarettes at  home, at work, and in your car.  Throw away smoking accessories, such as Scientist, research (medical).  Clean your car. Make sure to empty the ashtray.  Clean your home, including curtains and carpets. What strategies can I use to quit smoking? Talk with your health care provider about combining strategies, such as taking medicines while you are also receiving in-person counseling. Using these two strategies together makes you more likely to succeed in quitting than if you used either strategy on its own.  If you are pregnant or breastfeeding, talk with your health care provider about finding counseling or other support strategies to quit smoking. Do not take medicine to help you quit smoking unless your health care provider tells you to do so. To quit smoking: Quit right away  Quit smoking completely, instead of gradually reducing how much you smoke over a period of time. Research shows that stopping smoking right away is more successful than gradually quitting.  Attend in-person counseling to help you build problem-solving skills. You are more likely to succeed in quitting if you attend counseling sessions regularly. Even short sessions of 10 minutes can be effective. Take medicine You may take medicines to help you quit smoking. Some medicines require a prescription and some you can purchase over-the-counter. Medicines may have nicotine in them to replace the nicotine in cigarettes. Medicines may:  Help to stop cravings.  Help to relieve withdrawal symptoms. Your health care provider may recommend:  Nicotine patches, gum, or lozenges.  Nicotine inhalers or sprays.  Non-nicotine medicine that is taken by mouth. Find resources Find resources and support systems that can help you to quit smoking and remain smoke-free after you quit. These resources are most helpful when you use them often. They include:  Online chats with a Social worker.  Telephone quitlines.  Printed Furniture conservator/restorer.   Support groups or group counseling.  Text messaging programs.  Mobile phone apps or applications. Use apps that can help you stick to your quit plan by providing reminders, tips, and encouragement. There are many free apps for mobile devices as well as websites. Examples include Quit Guide from the [employer] and smokefree.gov What things can I do to make it easier to quit?   Reach out to your family and friends for support and encouragement. Call telephone quitlines (1-800-QUIT-NOW), reach out to support groups, or work with a counselor for support.  Ask people who smoke to avoid smoking around you.  Avoid places that trigger you to smoke, such as bars, parties, or smoke-break areas at work.  Spend time with people who do not smoke.  Lessen the stress in your life. Stress can be a smoking trigger for some people. To lessen stress, try: ? Exercising regularly. ? Doing deep-breathing exercises. ? Doing yoga. ? Meditating. ? Performing a body scan. This involves closing your eyes, scanning your body from head to toe, and noticing which parts of your body are particularly tense. Try to relax the muscles in those areas. How will I feel when I quit smoking? Day 1 to  3 weeks Within the first 24 hours of quitting smoking, you may start to feel withdrawal symptoms. These symptoms are usually most noticeable 2-3 days after quitting, but they usually do not last for more than 2-3 weeks. You may experience these symptoms:  Mood swings.  Restlessness, anxiety, or irritability.  Trouble concentrating.  Dizziness.  Strong cravings for sugary foods and nicotine.  Mild weight gain.  Constipation.  Nausea.  Coughing or a sore throat.  Changes in how the medicines that you take for unrelated issues work in your body.  Depression.  Trouble sleeping (insomnia). Week 3 and afterward After the first 2-3 weeks of quitting, you may start to notice more positive results, such as:  Improved  sense of smell and taste.  Decreased coughing and sore throat.  Slower heart rate.  Lower blood pressure.  Clearer skin.  The ability to breathe more easily.  Fewer sick days. Quitting smoking can be very challenging. Do not get discouraged if you are not successful the first time. Some people need to make many attempts to quit before they achieve long-term success. Do your best to stick to your quit plan, and talk with your health care provider if you have any questions or concerns. Summary  Smoking tobacco is the leading cause of preventable death. Quitting smoking is one of the best things that you can do for your health.  When you decide to quit smoking, create a plan to help you succeed.  Quit smoking right away, not slowly over a period of time.  When you start quitting, seek help from your health care provider, family, or friends. This information is not intended to replace advice given to you by your health care provider. Make sure you discuss any questions you have with your health care provider. Document Released: 09/25/2001 Document Revised: 12/19/2018 Document Reviewed: 12/20/2018 Elsevier Patient Education  2020 [employer].

## 2019-09-08 NOTE — Progress Notes (Signed)
Established Patient Office Visit  Subjective:  Patient ID: DEMARRIUS Ward, male    DOB: Feb 13, 1971  Age: 48 y.o. MRN: [id number]  CC:  Chief Complaint  Patient presents with  . Establish Care  . Hospitalization Follow-up    HPI Max Ward presents for establishment of care and hospital discharge follow-up status post mid epigastric pain.  Hospital records were reviewed.  Patient's INR was subtherapeutic and he was bridged with Lovenox advised to follow-up with the Coumadin clinic.  Diagnosed with GERD and started on Protonix.  Feels as though the med is helping.  He had been on Nexium that had been discontinued by his insurance company he tells me.  Smokes half a pack of cigarettes daily.  Significant cardiac history with aortic valve replacement and ventricular arrhythmia.  He denies chest pain or shortness of breath today.  Significant psychiatric history with bipolar disease.  Currently seeing psychiatry as well.  Patient lives alone in a rented room.  Past Medical History:  Diagnosis Date  . Anxiety   . Asthma   . Bipolar 1 disorder ([place])   . Complication of anesthesia    woke up during colonoscoy  . DDD (degenerative disc disease), cervical   . Depression   . DJD (degenerative joint disease)   . Hypertension   . SVT (supraventricular tachycardia) (HCC)    s/p prior ablation by Dr Seabron Spates at [facility] for [place]    Past Surgical History:  Procedure Laterality Date  . BONE TUMOR EXCISION     skull  . CARDIAC CATHETERIZATION  2018   [facility] reveals normal cors  . GANGLION CYST EXCISION Right 09/03/2018   Procedure: REMOVAL GANGLION OF RIGHT WRIST AND RIGHT ELBOW;  Surgeon: Leandrew Koyanagi, MD;  Location: [place];  Service: Orthopedics;  Laterality: Right;  OTHER SITE IS ELBOW  . MECHANICAL AORTIC VALVE REPLACEMENT      Family History  Problem Relation Age of Onset  . Cancer Mother   . Emphysema Father     Social History   Socioeconomic History   . Marital status: Legally Separated    Spouse name: Not on file  . Number of children: Not on file  . Years of education: Not on file  . Highest education level: Not on file  Occupational History  . Not on file  Social Needs  . Financial resource strain: Not on file  . Food insecurity    Worry: Not on file    Inability: Not on file  . Transportation needs    Medical: Not on file    Non-medical: Not on file  Tobacco Use  . Smoking status: Current Every Day Smoker    Packs/day: 1.00    Years: 15.00    Pack years: 15.00    Types: Cigarettes  . Smokeless tobacco: Never Used  . Tobacco comment: 1/2 pack  or less per day Jan 2020  Substance and Sexual Activity  . Alcohol use: Yes    Comment: 1-3 beers on occ.   . Drug use: No  . Sexual activity: Not on file  Lifestyle  . Physical activity    Days per week: Not on file    Minutes per session: Not on file  . Stress: Not on file  Relationships  . Social Herbalist on phone: Not on file    Gets together: Not on file    Attends religious service: Not on file    Active member of  club or organization: Not on file    Attends meetings of clubs or organizations: Not on file    Relationship status: Not on file  . Intimate partner violence    Fear of current or ex partner: Not on file    Emotionally abused: Not on file    Physically abused: Not on file    Forced sexual activity: Not on file  Other Topics Concern  . Not on file  Social History Narrative   Lives in [place] with a friend.     disabled    Outpatient Medications Prior to Visit  Medication Sig Dispense Refill  . ADVAIR DISKUS 250-50 MCG/DOSE AEPB INHALE 1 PUFF BY MOUTH TWICE A DAY 180 each 1  . albuterol (PROVENTIL HFA;VENTOLIN HFA) 108 (90 Base) MCG/ACT inhaler INHALE 1-2 PUFFS BY MOUTH EVERY 4-6 HOURS AS NEEDED AND AS DIRECTED 1 Inhaler 5  . albuterol (PROVENTIL) (2.5 MG/3ML) 0.083% nebulizer solution Take 3 mLs (2.5 mg total) by nebulization every 6  (six) hours as needed for wheezing or shortness of breath. 75 mL 12  . alprazolam (XANAX) 2 MG tablet Take 2 mg by mouth 4 (four) times daily as needed for sleep.    Marland Kitchen amphetamine-dextroamphetamine (ADDERALL XR) 25 MG 24 hr capsule Take 1 capsule by mouth 2 (two) times daily.    . Asenapine Maleate (SAPHRIS) 10 MG SUBL Place 30 mg under the tongue at bedtime.    Marland Kitchen azelastine (ASTELIN) 0.1 % nasal spray Place 1 spray into both nostrils 2 (two) times daily. Use in each nostril as directed    . flecainide (TAMBOCOR) 50 MG tablet Take 1 tablet (50 mg total) by mouth 2 (two) times daily. 60 tablet 3  . fluticasone (FLONASE) 50 MCG/ACT nasal spray Place 1 spray into both nostrils daily. 16 g 2  . hydrOXYzine (VISTARIL) 50 MG capsule Take 1 capsule by mouth as needed for anxiety.    . metoprolol succinate (TOPROL-XL) 50 MG 24 hr tablet Take 50 mg by mouth daily. Take with or immediately following a meal.     . montelukast (SINGULAIR) 10 MG tablet TAKE 1 TABLET BY MOUTH EVERY DAY 90 tablet 0  . pantoprazole (PROTONIX) 40 MG tablet Take 1 tablet (40 mg total) by mouth daily. 30 tablet 0  . ranolazine (RANEXA) 500 MG 12 hr tablet Take 1 tablet by mouth 2 (two) times daily.    . traZODone (DESYREL) 50 MG tablet Take 200 mg by mouth at bedtime.    Marland Kitchen warfarin (COUMADIN) 5 MG tablet Take 1 to 1 and 1/2 tablet daily as directed by coumadin clinic 45 tablet 1  . famotidine (PEPCID) 20 MG tablet Take 1 tablet (20 mg total) by mouth 2 (two) times daily. 30 tablet 0  . enoxaparin (LOVENOX) 100 MG/ML injection Inject 1.35 mLs (135 mg total) into the skin daily for 5 days. 6.75 mL 0  . traMADol (ULTRAM) 50 MG tablet Take 1 tablet (50 mg total) by mouth 2 (two) times daily. 30 tablet 0   No facility-administered medications prior to visit.     Allergies  Allergen Reactions  . Molds & Smuts Other (See Comments)    Unknown  . Valium [Diazepam] Palpitations  . Vicodin [Hydrocodone-Acetaminophen] Palpitations     ROS Review of Systems  Constitutional: Negative for chills, diaphoresis, fatigue, fever and unexpected weight change.  HENT: Negative.   Eyes: Negative for photophobia and visual disturbance.  Respiratory: Negative.   Cardiovascular: Positive for palpitations. Negative for chest  pain.  Gastrointestinal: Positive for abdominal pain. Negative for anal bleeding, blood in stool, nausea and vomiting.  Endocrine: Negative for polyphagia and polyuria.  Genitourinary: Negative.   Musculoskeletal: Negative for gait problem and joint swelling.  Skin: Negative for pallor and rash.  Neurological: Negative for speech difficulty.  Psychiatric/Behavioral: Positive for dysphoric mood.      Objective:    Physical Exam  Constitutional: He is oriented to person, place, and time. He appears well-developed and well-nourished. No distress.  HENT:  Head: Normocephalic and atraumatic.  Right Ear: External ear normal.  Left Ear: External ear normal.  Eyes: Pupils are equal, round, and reactive to light. Conjunctivae are normal. Right eye exhibits no discharge. Left eye exhibits no discharge. No scleral icterus.  Neck: Neck supple. No JVD present. No tracheal deviation present. No thyromegaly present.  Pulmonary/Chest: Effort normal and breath sounds normal. No stridor.  Abdominal: Bowel sounds are normal. He exhibits no distension. There is abdominal tenderness (mild epigastric tttp without rebound. ). There is no rebound and no guarding.  Musculoskeletal:        General: No edema.  Lymphadenopathy:    He has no cervical adenopathy.  Neurological: He is alert and oriented to person, place, and time.  Skin: Skin is warm and dry. He is not diaphoretic.  Psychiatric: He has a normal mood and affect. His behavior is normal.    BP 120/76   Pulse 67   Temp (!) 97.2 F (36.2 C) (Tympanic)   Ht 5\' 8"  (1.727 m)   Wt 199 lb (90.3 kg)   SpO2 97%   BMI 30.26 kg/m  Wt Readings from Last 3 Encounters:   09/08/19 199 lb (90.3 kg)  08/31/19 200 lb (90.7 kg)  06/24/19 196 lb (88.9 kg)   BP Readings from Last 3 Encounters:  09/08/19 120/76  09/01/19 140/80  08/31/19 (!) 152/87   Guideline developer:  UpToDate (see UpToDate for funding source) Date Released: June 2014  Health Maintenance Due  Topic Date Due  . HIV Screening  05/14/1986    There are no preventive care reminders to display for this patient.  Lab Results  Component Value Date   TSH 1.735 01/18/2012   Lab Results  Component Value Date   WBC 10.0 09/01/2019   HGB 16.4 09/01/2019   HCT 47.8 09/01/2019   MCV 89.7 09/01/2019   PLT 240 09/01/2019   Lab Results  Component Value Date   NA 136 08/31/2019   K 4.5 08/31/2019   CO2 20 (L) 08/31/2019   GLUCOSE 98 08/31/2019   BUN 11 08/31/2019   CREATININE 1.14 08/31/2019   BILITOT 1.5 (H) 09/01/2019   ALKPHOS 64 09/01/2019   AST 23 09/01/2019   ALT 35 09/01/2019   PROT 7.2 09/01/2019   ALBUMIN 4.4 09/01/2019   CALCIUM 10.2 08/31/2019   ANIONGAP 13 08/31/2019   No results found for: CHOL No results found for: HDL No results found for: LDLCALC No results found for: TRIG No results found for: CHOLHDL No results found for: HGBA1C    Assessment & Plan:   Problem List Items Addressed This Visit      Digestive   Gastroesophageal reflux disease   Relevant Medications   pantoprazole (PROTONIX) 40 MG tablet     Other   Tobacco use   Hospital discharge follow-up - Primary      Meds ordered this encounter  Medications  . DISCONTD: omeprazole (PRILOSEC) 40 MG capsule    Sig: Take one  tablet twice daily    Dispense:  60 capsule    Refill:  3  . pantoprazole (PROTONIX) 40 MG tablet    Sig: Take 1 tablet (40 mg total) by mouth daily.    Dispense:  30 tablet    Refill:  3    Follow-up: Return in about 3 months (around 12/09/2019).   The patient was counseled on the dangers of tobacco use, and was advised to quit.  Reviewed strategies to maximize  success, including removing cigarettes and smoking materials from environment. Also discussed that it would help his reflux. He will be seeing GI for follow up in a few weeks. Advised to return to the coumadin clinic for follow up. Patient declined flu vaccine.

## 2019-09-09 ENCOUNTER — Ambulatory Visit (INDEPENDENT_AMBULATORY_CARE_PROVIDER_SITE_OTHER): Payer: Medicare Other | Admitting: Pharmacist

## 2019-09-09 DIAGNOSIS — Z7901 Long term (current) use of anticoagulants: Secondary | ICD-10-CM

## 2019-09-09 DIAGNOSIS — Z952 Presence of prosthetic heart valve: Secondary | ICD-10-CM | POA: Diagnosis not present

## 2019-09-09 LAB — POCT INR: INR: 1.9 — AB (ref 2.0–3.0)

## 2019-09-12 ENCOUNTER — Other Ambulatory Visit: Payer: Self-pay | Admitting: Family Medicine

## 2019-09-12 IMAGING — MR MR SHOULDER*R* W/O CM
5 series · 36 of 40 positions shown · non-contrast
Comparison: Plain films right shoulder 03/11/2019 from [REDACTED].

CLINICAL DATA: Right shoulder pain since the patient fell off tool
box on the concrete 6 months ago.

EXAM:
MRI OF THE RIGHT SHOULDER WITHOUT CONTRAST
TECHNIQUE: Multiplanar, multisequence MR imaging of the shoulder was performed.
No intravenous contrast was administered.

[Series 3: PD fat-sat · axial · 4.0mm · 0.55mm/px · z∈[-16,+68]mm · 9 of 20 slices shown (1 of 2)]
[im 1/20]
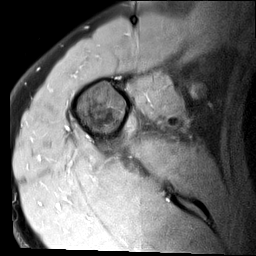
[im 3/20]
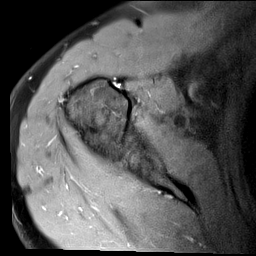
[im 5/20]
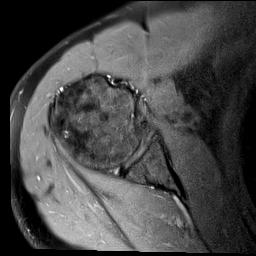
[im 8/20]
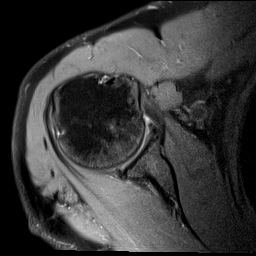
[im 10/20]
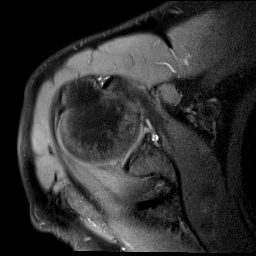
[im 12/20]
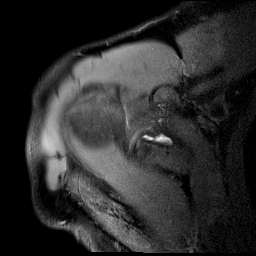
[im 15/20]
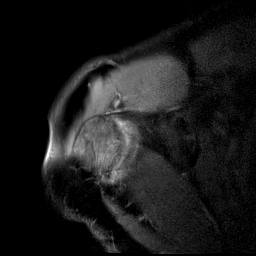
[im 17/20]
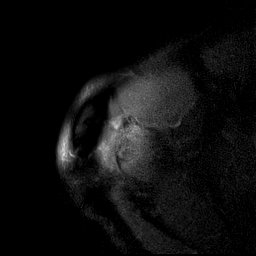
[im 20/20]
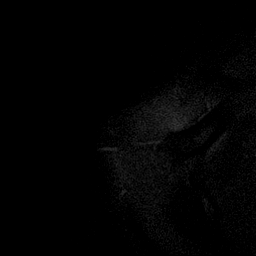

[Series 4: T2 fat-sat · oblique · 4.0mm · 0.55mm/px · 7 of 16 slices shown (1 of 2)]
[im 1/16]
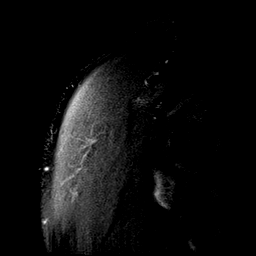
[im 3/16]
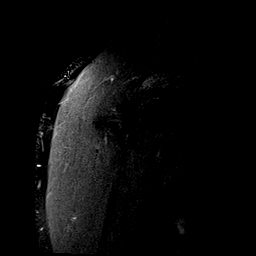
[im 6/16]
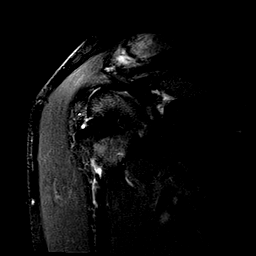
[im 8/16]
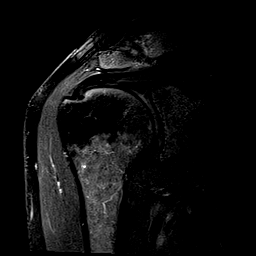
[im 11/16]
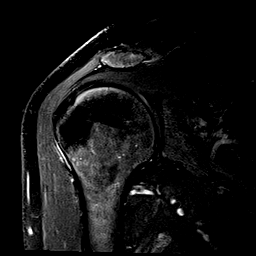
[im 13/16]
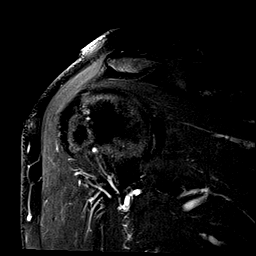
[im 16/16]
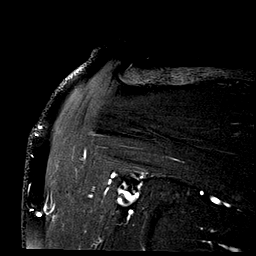

[Series 5: PD fat-sat · oblique · 4.0mm · 0.27mm/px · 8 of 18 slices shown (2 of 2)]
[im 1/18]
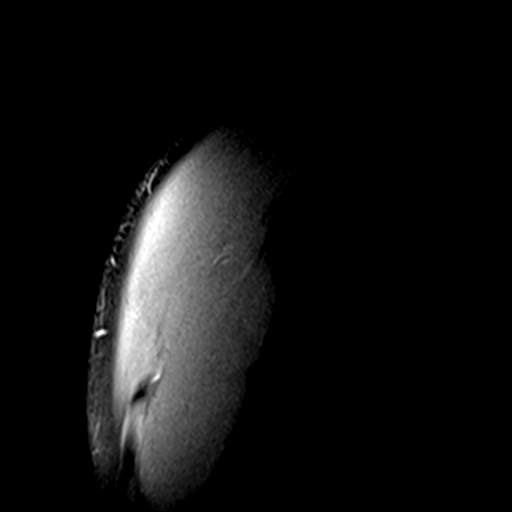
[im 3/18]
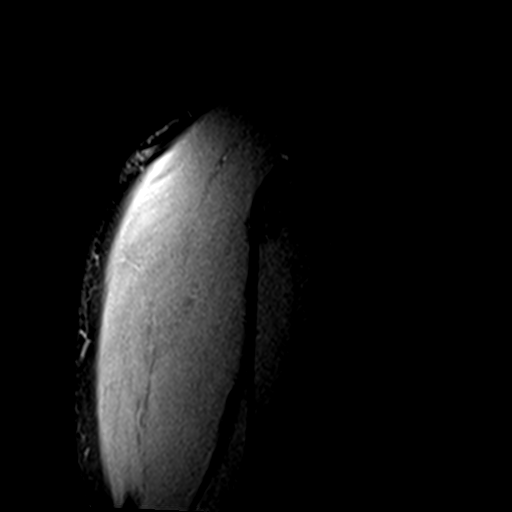
[im 5/18]
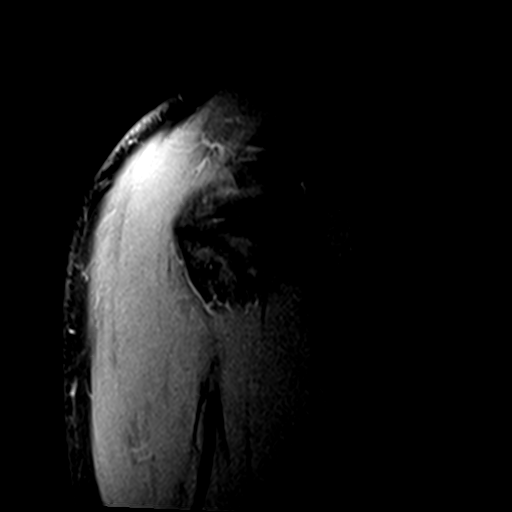
[im 8/18]
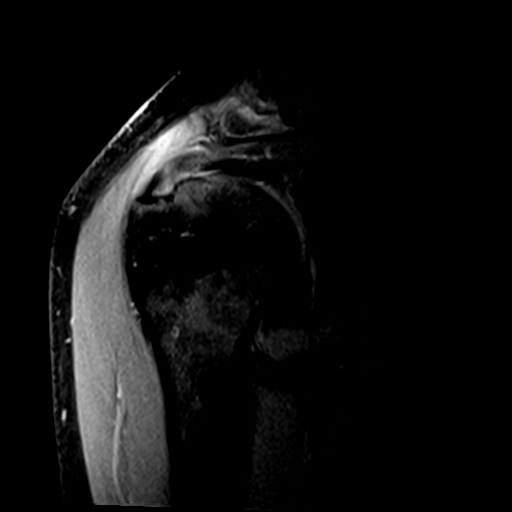
[im 10/18]
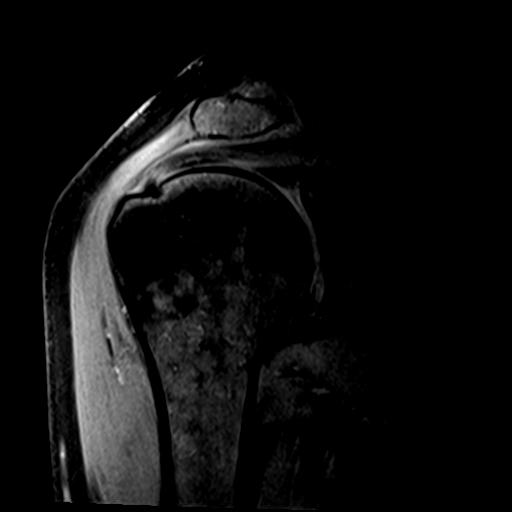
[im 13/18]
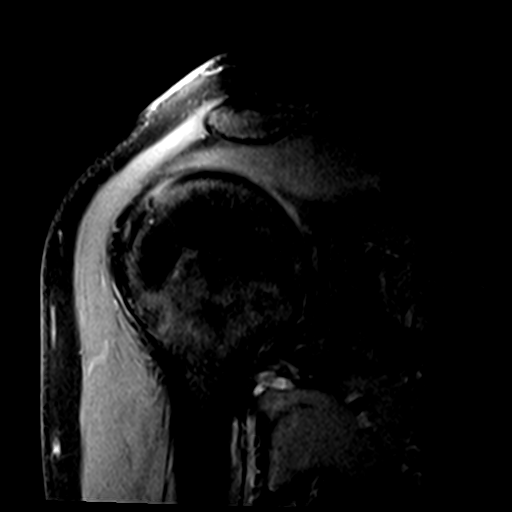
[im 15/18]
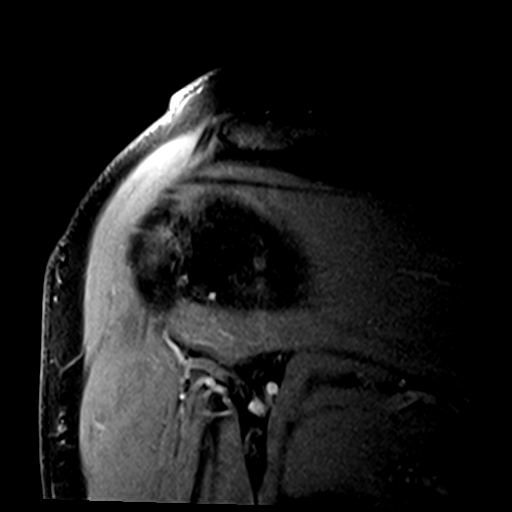
[im 18/18]
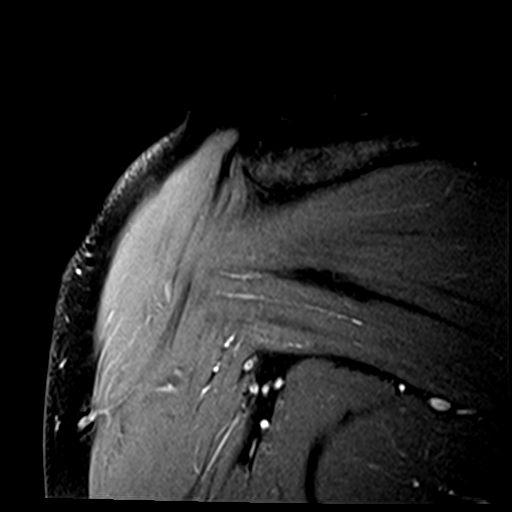

[Series 6: T1 · oblique · 4.0mm · 0.27mm/px · 4 of 18 slices shown]
[im 1/18]
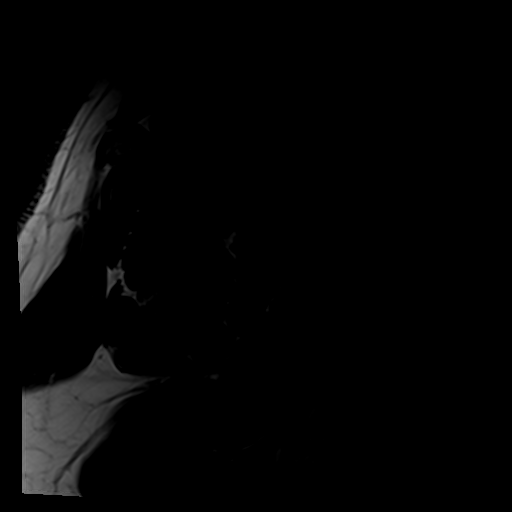
[im 3/18]
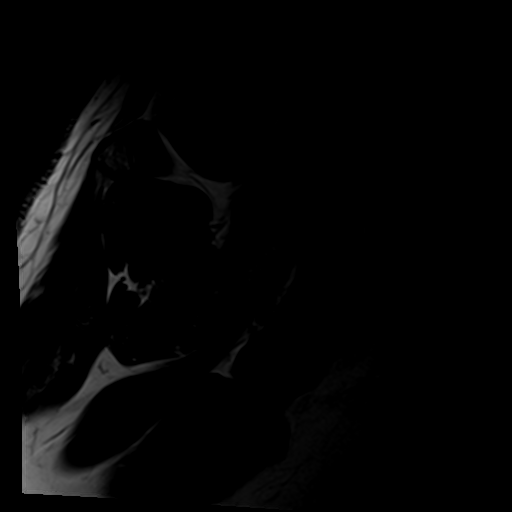
[im 5/18]
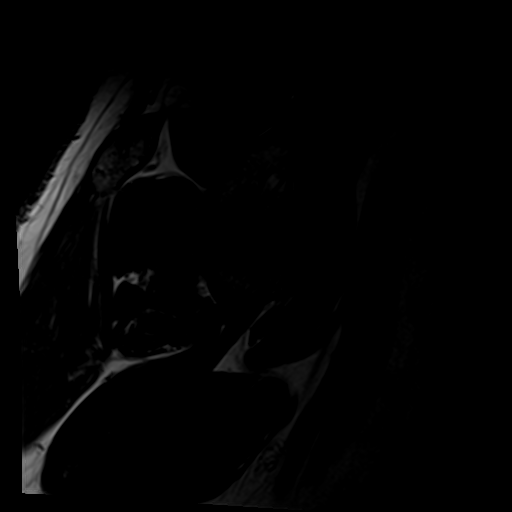
[im 8/18]
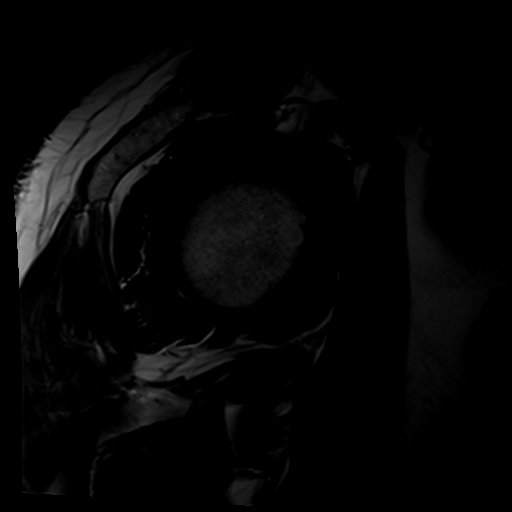

[Series 7: T2 fat-sat · oblique · 4.0mm · 0.55mm/px · 8 of 18 slices shown (2 of 2)]
[im 1/18]
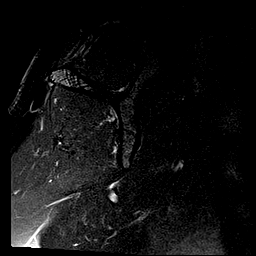
[im 3/18]
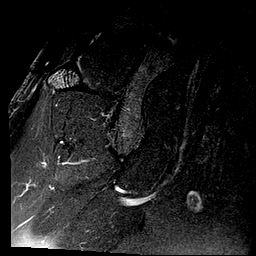
[im 5/18]
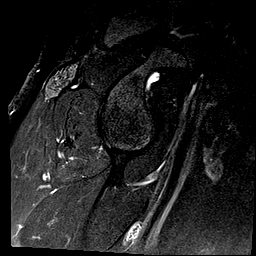
[im 8/18]
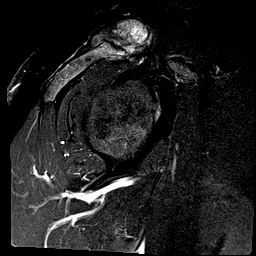
[im 10/18]
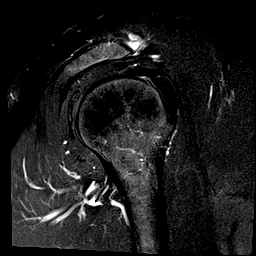
[im 13/18]
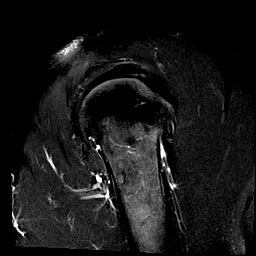
[im 15/18]
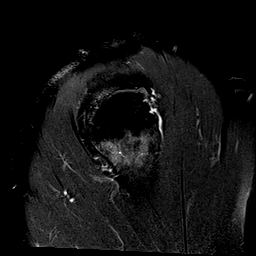
[im 18/18]
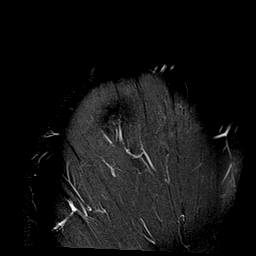

[36 of 40 positions shown; findings below may reference images not displayed]

FINDINGS: Rotator cuff: Mildly increased T2 signal in the supraspinatus and
infraspinatus tendons consistent with tendinopathy is seen. No tear.
The rotator cuff is otherwise normal in appearance.

Muscles:  Normal without atrophy or focal lesion.

Biceps long head:  Intact and normal in appearance.

Acromioclavicular Joint: Moderate osteoarthritis is present with
fairly intense marrow edema about the joint. Type 2 acromion. No
subacromial/subdeltoid bursal fluid.

Glenohumeral Joint: Appears normal.

Labrum:  Intact.

Bones:  No fracture or focal lesion.

Other: None.
IMPRESSION: Dominant finding is acromioclavicular osteoarthritis with intense
marrow edema about the joint.

Mild supraspinatus and infraspinatus tendinopathy without tear.

## 2019-09-19 ENCOUNTER — Other Ambulatory Visit: Payer: Self-pay | Admitting: Orthopaedic Surgery

## 2019-09-21 ENCOUNTER — Other Ambulatory Visit: Payer: Self-pay

## 2019-09-21 NOTE — Telephone Encounter (Signed)
Ok to refill with previous insturctions

## 2019-09-27 NOTE — Progress Notes (Deleted)
09/27/2019 [employer] [id number] 18-Jan-1971   HISTORY OF PRESENT ILLNESS: Max Ward is a 48 year old male with a past medical history of anxiety, bipoloar disorder, asthma, hypertension, SVT, s/p bicuspid aortic valve s/p mechanical AVR and 46 mm ascending aorta dilatation on Coumadin and GI bleed. He presented to [facility] urgent cae on 08/31/2019 with epigastric pain for the past 3 months  and melena x 1 which occurred on 08/28/2019.  He was sent to [facility] ER for further evaluation. He developed chest pain while he was in the ER. Coumadin was previously held for a few days due to INR 3.5. Hg 16.4 without evidence of active GI bleeding. Elevated T. Bili 1.5, Direct bili 0.2 with normal alk phos and LFTs.  His EKG did not show any evidence of acute ischemia. His INR was subtherapeutic at 1.2 and he was bridged with Lovenox 1.89m/kg and Coumadin was increased to 188mpo. He was discharged home on Protonix 4039mo daily and Famotidine 79m53m bid. He presents today for further GI follow up.   CBC Latest Ref Rng & Units 09/01/2019 08/31/2019 06/29/2019  WBC 4.0 - 10.5 K/uL 10.0 9.9 10.5  Hemoglobin 13.0 - 17.0 g/dL 16.4 17.6(H) 16.8  Hematocrit 39.0 - 52.0 % 47.8 50.9 49.4  Platelets 150 - 400 K/uL 240 260 276   CMP Latest Ref Rng & Units 09/01/2019 08/31/2019 06/29/2019  Glucose 70 - 99 mg/dL - 98 108(H)  BUN 6 - 20 mg/dL - 11 12  Creatinine 0.61 - 1.24 mg/dL - 1.14 0.86  Sodium 135 - 145 mmol/L - 136 137  Potassium 3.5 - 5.1 mmol/L - 4.5 3.8  Chloride 98 - 111 mmol/L - 103 106  CO2 22 - 32 mmol/L - 20(L) 19(L)  Calcium 8.9 - 10.3 mg/dL - 10.2 9.1  Total Protein 6.5 - 8.1 g/dL 7.2 - -  Total Bilirubin 0.3 - 1.2 mg/dL 1.5(H) - -  Alkaline Phos 38 - 126 U/L 64 - -  AST 15 - 41 U/L 23 - -  ALT 0 - 44 U/L 35 - -    Abdominal/pelvic CT 09/01/2019:  1.  Note that there is a degree of motion artifact. 2. No bowel wall thickening or bowel obstruction evident. No abscess in the  abdomen or pelvis. Appendix appears normal. With respect to blood in stool, would advise direct colonic visualization after appropriate colonic cleansing. 3. No renal or ureteral calculi. No hydronephrosis. Urinary bladder wall thickness within normal limits.   Past Medical History:  Diagnosis Date  . Anxiety   . Asthma   . Bipolar 1 disorder (HCC)[place]. Complication of anesthesia    woke up during colonoscoy  . DDD (degenerative disc disease), cervical   . Depression   . DJD (degenerative joint disease)   . Hypertension   . SVT (supraventricular tachycardia) (HCC)    [facility]:  Procedure Laterality Date  . BONE TUMOR EXCISION     skull  . CARDIAC [facility]IZATION  [facility]  . GANGLION CYST EXCISION Right 09/03/2018   Procedure: REMOVAL GANGLION OF RIGHT WRIST AND RIGHT ELBOW;  Surgeon: Xu, Leandrew Koyanagi;  [place];  Laterality: Right;  OTHER SITE IS ELBOW  . MECHANICAL AORTIC VALVE REPLACEMENT      reports that he has been smoking cigarettes. He has  a 15.00 pack-year smoking history. He has never used smokeless tobacco. He reports current alcohol use. He reports that he does not use drugs. family history includes Cancer in his mother; Emphysema in his father. Allergies  Allergen Reactions  . Molds & Smuts Other (See Comments)    Unknown  . Valium [Diazepam] Palpitations  . Vicodin [Hydrocodone-Acetaminophen] Palpitations      Outpatient Encounter Medications as of 09/28/2019  Medication Sig  . ADVAIR DISKUS 250-50 MCG/DOSE AEPB INHALE 1 PUFF BY MOUTH TWICE A DAY  . albuterol (PROVENTIL) (2.5 MG/3ML) 0.083% nebulizer solution Take 3 mLs (2.5 mg total) by nebulization every 6 (six) hours as needed for wheezing or shortness of breath.  . albuterol (VENTOLIN HFA) 108 (90 Base) MCG/ACT inhaler INHALE 1-2 PUFFS BY MOUTH EVERY 4-6 HOURS AS  NEEDED AND AS DIRECTED  . alprazolam (XANAX) 2 MG tablet Take 2 mg by mouth 4 (four) times daily as needed for sleep.  . amphetamine-dextroamphetamine (ADDERALL XR) 25 MG 24 hr capsule Take 1 capsule by mouth 2 (two) times daily.  . Asenapine Maleate (SAPHRIS) 10 MG SUBL Place 30 mg under the tongue at bedtime.  . azelastine (ASTELIN) 0.1 % nasal spray Place 1 spray into both nostrils 2 (two) times daily. Use in each nostril as directed  . enoxaparin (LOVENOX) 100 MG/ML injection Inject 1.35 mLs (135 mg total) into the skin daily for 5 days.  . flecainide (TAMBOCOR) 50 MG tablet Take 1 tablet (50 mg total) by mouth 2 (two) times daily.  . fluticasone (FLONASE) 50 MCG/ACT nasal spray Place 1 spray into both nostrils daily.  . hydrOXYzine (VISTARIL) 50 MG capsule Take 1 capsule by mouth as needed for anxiety.  . metoprolol succinate (TOPROL-XL) 50 MG 24 hr tablet Take 50 mg by mouth daily. Take with or immediately following a meal.   . montelukast (SINGULAIR) 10 MG tablet TAKE 1 TABLET BY MOUTH EVERY DAY  . pantoprazole (PROTONIX) 40 MG tablet Take 1 tablet (40 mg total) by mouth daily.  . pantoprazole (PROTONIX) 40 MG tablet Take 1 tablet (40 mg total) by mouth daily.  . ranolazine (RANEXA) 500 MG 12 hr tablet Take 1 tablet by mouth 2 (two) times daily.  . traMADol (ULTRAM) 50 MG tablet TAKE 1 TABLET TWICE A DAY AS NEEDED FOR PAIN  . traZODone (DESYREL) 50 MG tablet Take 200 mg by mouth at bedtime.  . warfarin (COUMADIN) 5 MG tablet Take 1 to 1 and 1/2 tablet daily as directed by coumadin clinic   No facility-administered encounter medications on file as of 09/28/2019.   Family History  Problem Relation Age of Onset  . Cancer Mother   . Emphysema Father     REVIEW OF SYSTEMS  : All other systems reviewed and negative except where noted in the History of Present Illness.   PHYSICAL EXAM: There were no vitals taken for this visit. General: Well developed white male in no acute  distress Head: Normocephalic and atraumatic Eyes:  sclerae anicteric,conjunctive pink. Ears: Normal auditory acuity Neck: Supple, no masses.  Lungs: Clear throughout to auscultation Heart: Regular rate and rhythm Abdomen: Soft, nontender, non distended. No masses or hepatomegaly noted. Normal bowel sounds Rectal: *** Musculoskeletal: Symmetrical with no gross deformities  Skin: No lesions on visible extremities Extremities: No edema  Neurological: Alert oriented x 4, grossly nonfocal Cervical Nodes:  No significant cervical adenopathy Psychological:  Alert and cooperative. Normal mood and affect  ASSESSMENT AND PLAN:  1. 48 year old male   with a 3 month history or epigastric pain and melena x 1 -EGD   2. Colon cancer screening  -Colonoscopy to be completed at the time of his EGD  3. S/P AVR with a mechanical valve on Coumadin. PVCs.  4. Elevated T. Bili, indirect > direct most likely due to GI bleed with hemolysis     CC:  Houston Siren., MD

## 2019-09-28 ENCOUNTER — Ambulatory Visit: Payer: Medicare Other | Admitting: Nurse Practitioner

## 2019-09-29 ENCOUNTER — Ambulatory Visit (INDEPENDENT_AMBULATORY_CARE_PROVIDER_SITE_OTHER): Payer: Medicare Other | Admitting: Pharmacist Clinician (PhC)/ Clinical Pharmacy Specialist

## 2019-09-29 DIAGNOSIS — Z7901 Long term (current) use of anticoagulants: Secondary | ICD-10-CM | POA: Diagnosis not present

## 2019-09-29 DIAGNOSIS — Z952 Presence of prosthetic heart valve: Secondary | ICD-10-CM | POA: Diagnosis not present

## 2019-09-29 LAB — POCT INR: INR: 3.5 — AB (ref 2.0–3.0)

## 2019-10-03 ENCOUNTER — Other Ambulatory Visit: Payer: Self-pay | Admitting: Internal Medicine

## 2019-10-15 ENCOUNTER — Encounter: Payer: Self-pay | Admitting: Gastroenterology

## 2019-10-15 ENCOUNTER — Ambulatory Visit (INDEPENDENT_AMBULATORY_CARE_PROVIDER_SITE_OTHER): Payer: Medicare Other | Admitting: Cardiovascular Disease

## 2019-10-15 DIAGNOSIS — Z952 Presence of prosthetic heart valve: Secondary | ICD-10-CM | POA: Diagnosis not present

## 2019-10-15 DIAGNOSIS — Z7901 Long term (current) use of anticoagulants: Secondary | ICD-10-CM | POA: Diagnosis not present

## 2019-10-15 LAB — POCT INR: INR: 2.6 (ref 2.0–3.0)

## 2019-10-29 ENCOUNTER — Ambulatory Visit (INDEPENDENT_AMBULATORY_CARE_PROVIDER_SITE_OTHER): Payer: Medicare Other | Admitting: Gastroenterology

## 2019-10-29 ENCOUNTER — Encounter: Payer: Self-pay | Admitting: Gastroenterology

## 2019-10-29 VITALS — BP 124/80 | HR 80 | Temp 98.0°F | Ht 67.5 in | Wt 202.2 lb

## 2019-10-29 DIAGNOSIS — K219 Gastro-esophageal reflux disease without esophagitis: Secondary | ICD-10-CM

## 2019-10-29 DIAGNOSIS — K581 Irritable bowel syndrome with constipation: Secondary | ICD-10-CM | POA: Diagnosis not present

## 2019-10-29 DIAGNOSIS — F419 Anxiety disorder, unspecified: Secondary | ICD-10-CM

## 2019-10-29 DIAGNOSIS — R1084 Generalized abdominal pain: Secondary | ICD-10-CM | POA: Diagnosis not present

## 2019-10-29 MED ORDER — DICYCLOMINE HCL 10 MG PO CAPS: 10.0000 mg | ORAL_CAPSULE | Freq: Two times a day (BID) | ORAL | 5 refills | Status: DC

## 2019-10-29 MED ORDER — ESOMEPRAZOLE MAGNESIUM 40 MG PO CPDR: 40.0000 mg | DELAYED_RELEASE_CAPSULE | Freq: Every day | ORAL | 5 refills | Status: AC

## 2019-10-29 NOTE — Progress Notes (Addendum)
10/29/2019 [place] [id number] Aug 26, 1971   HISTORY OF PRESENT ILLNESS:  This is a 49 year old male who is new to our office.  He is here today with complaints of generalized abdominal pain, constipation, and acid reflux.  He has history of mechanical AVR and is on chronic coumadin.  He reports longstanding issues with anxiety/panic attacks.  He says that he has had issues with his stomach since he was a child as well.  He says that he has problems with his memory.  He believes that he has had at least one colonoscopy in the past, but we do not have records and he does not recall as to where, when, or what the findings were.  Nonetheless, he reports constipation, sometimes he will go a week at a time without a bowel movement and then just passed a small amount of hard stool.  Reports having tried enemas, magnesium citrate, etc. in the past.  Gets occasional diarrhea if he eats something that does not necessarily agree with him.  Reports generalized abdominal pain.  Reports somewhat uncontrolled acid reflux.  He is on pantoprazole 40 mg twice daily.  He tells me that he used to be on Nexium in the past and that seemed to work much better for him, but then there was issues with insurance coverage previously.  He was in the ER back in November and had a CT scan of the abdomen pelvis with contrast that was unremarkable.  CBC, lipase, hepatic function panel, BMP all relatively unremarkable at that time.  He was Hemoccult negative at that time.  He reports recent issues with palpitations or his heart rhythm.  He was supposed to have an appoint with cardiology earlier this week, but canceled due to not feeling well.  Makes a comment saying that they cannot figure out what is going on with his heart.  He reports he rents a room off of somebody and just tends to stay in his room all the time and orders food out as he is extremely scared of Covid.  He has somewhat rambling speech and tends to  bounce around to different subjects/issues.   Past Medical History:  Diagnosis Date  . Anxiety   . Asthma   . Bipolar 1 disorder ([place])   . Complication of anesthesia    woke up during colonoscoy  . DDD (degenerative disc disease), cervical   . Depression   . DJD (degenerative joint disease)   . Hypertension   . SVT (supraventricular tachycardia) (HCC)    s/p prior ablation by Dr Seabron Spates at [facility] for [place]   Past Surgical History:  Procedure Laterality Date  . BONE TUMOR EXCISION     skull  . CARDIAC CATHETERIZATION  2018   [facility] reveals normal cors  . GANGLION CYST EXCISION Right 09/03/2018   Procedure: REMOVAL GANGLION OF RIGHT WRIST AND RIGHT ELBOW;  Surgeon: Leandrew Koyanagi, MD;  Location: [facility];  Service: Orthopedics;  Laterality: Right;  OTHER SITE IS ELBOW  . MECHANICAL AORTIC VALVE REPLACEMENT      reports that he has been smoking cigarettes. He has a 15.00 pack-year smoking history. He has never used smokeless tobacco. He reports current alcohol use. He reports that he does not use drugs. family history includes Cancer in his mother; Emphysema in his father. Allergies  Allergen Reactions  . Molds & Smuts Other (See Comments)    Unknown  . Valium [Diazepam] Palpitations  . Vicodin [Hydrocodone-Acetaminophen]  Palpitations      Outpatient Encounter Medications as of 10/29/2019  Medication Sig  . ADVAIR DISKUS 250-50 MCG/DOSE AEPB INHALE 1 PUFF BY MOUTH TWICE A DAY  . albuterol (PROVENTIL) (2.5 MG/3ML) 0.083% nebulizer solution Take 3 mLs (2.5 mg total) by nebulization every 6 (six) hours as needed for wheezing or shortness of breath.  Marland Kitchen albuterol (VENTOLIN HFA) 108 (90 Base) MCG/ACT inhaler INHALE 1-2 PUFFS BY MOUTH EVERY 4-6 HOURS AS NEEDED AND AS DIRECTED  . alprazolam (XANAX) 2 MG tablet Take 2 mg by mouth 4 (four) times daily as needed for sleep.  Marland Kitchen amphetamine-dextroamphetamine (ADDERALL XR) 25 MG 24 hr capsule Take 1 capsule by mouth 2 (two) times  daily.  . Asenapine Maleate (SAPHRIS) 10 MG SUBL Place 30 mg under the tongue at bedtime.  Marland Kitchen azelastine (ASTELIN) 0.1 % nasal spray Place 1 spray into both nostrils 2 (two) times daily. Use in each nostril as directed  . enoxaparin (LOVENOX) 100 MG/ML injection Inject 1.35 mLs (135 mg total) into the skin daily for 5 days.  . flecainide (TAMBOCOR) 50 MG tablet TAKE 1 TABLET BY MOUTH TWICE A DAY  . fluticasone (FLONASE) 50 MCG/ACT nasal spray Place 1 spray into both nostrils daily.  . hydrOXYzine (VISTARIL) 50 MG capsule Take 1 capsule by mouth as needed for anxiety.  . metoprolol succinate (TOPROL-XL) 50 MG 24 hr tablet Take 50 mg by mouth daily. Take with or immediately following a meal.   . montelukast (SINGULAIR) 10 MG tablet TAKE 1 TABLET BY MOUTH EVERY DAY  . pantoprazole (PROTONIX) 40 MG tablet Take 1 tablet (40 mg total) by mouth daily.  . pantoprazole (PROTONIX) 40 MG tablet Take 1 tablet (40 mg total) by mouth daily.  . ranolazine (RANEXA) 500 MG 12 hr tablet Take 1 tablet by mouth 2 (two) times daily.  . traMADol (ULTRAM) 50 MG tablet TAKE 1 TABLET TWICE A DAY AS NEEDED FOR PAIN  . traZODone (DESYREL) 50 MG tablet Take 200 mg by mouth at bedtime.  Marland Kitchen warfarin (COUMADIN) 5 MG tablet Take 1 to 1 and 1/2 tablet daily as directed by coumadin clinic   No facility-administered encounter medications on file as of 10/29/2019.     REVIEW OF SYSTEMS  : All other systems reviewed and negative except where noted in the History of Present Illness.   PHYSICAL EXAM: Temp 98 F (36.7 C)   Ht 5' 7.5" (1.715 m) Comment: height measured without shoes  Wt 202 lb 4 oz (91.7 kg)   BMI 31.21 kg/m  General: Well developed white male in no acute distress Head: Normocephalic and atraumatic Eyes:  Sclerae anicteric, conjunctiva pink. Ears: Normal auditory acuity Lungs: Clear throughout to auscultation; no increased WOB. Heart: Regular rate and rhythm; valve click noted. Abdomen: Soft, non-distended.   BS present. Mild diffuse TTP. Musculoskeletal: Symmetrical with no gross deformities  Skin: No lesions on visible extremities Extremities: No edema  Neurological: Alert oriented x 4, grossly non-focal Psychological:  Alert and cooperative. Normal mood and affect  ASSESSMENT AND PLAN: *Suspect IBS with constipation: Patient has a lot of issues with longstanding anxiety/panic attacks.  Sounds like he has had longstanding issues with his stomach as well.  He reports at least one colonoscopy, if not more in the past, however, we do not have those records and he does not remember details in regards to those.  He had a recent CT scan that was unremarkable and recent labs that looked normal as well.  I  advised him that we could certainly consider repeating a colonoscopy, but first I would like him to be seen by cardiology and cleared for this procedure.  He was supposed to have a cardiology appointment earlier this week but canceled due to not feeling well.  He reports recent issues with palpitations or heart rhythm.  Also, he will likely need Lovenox bridging due to his mechanical aortic valve.  For now I am going to give him a sample of a colonoscopy bowel prep to drink this evening to try to get a good cleanse.  Then he will begin MiraLAX daily for a few days and increase to twice daily if needed.  We will give him dicyclomine 10 mg to use twice daily for abdominal cramping/spasm.  Prescription sent. *GERD: Currently on pantoprazole 40 mg twice daily, but does not feel it is as effective as Nexium had been in the past.  We can send a prescription for Nexium 40 mg daily to start to see if his insurance will cover this and if that works better for him as it had in the past.  Prescription sent.  Can consider EGD at his next visit as well. *Mechanical aortic valve replacement requiring chronic anticoagulation with Coumadin   CC:  Houston Siren., MD

## 2019-10-29 NOTE — Patient Instructions (Signed)
We have sent the following medications to your pharmacy for you to pick up at your convenience: Nexium 40 mg daily Dicyclomine 10 mg twice daily  Discontinue pantoprazole.  Follow up with our office once you have been seen and cleared by cardiology.  If you are age 49 or older, your body mass index should be between 23-30. Your Body mass index is 31.21 kg/m. If this is out of the aforementioned range listed, please consider follow up with your Primary Care Provider.  If you are age 30 or younger, your body mass index should be between 19-25. Your Body mass index is 31.21 kg/m. If this is out of the aformentioned range listed, please consider follow up with your Primary Care Provider.    We have given you a clenpiq to use as a bowel purge:  1. Drink clear liquids the entire day - NO SOLID FOOD  3. Drink at least 64 oz. (8 glasses) of fluid/clear liquids during the day to prevent dehydration and help the prep work efficiently.  4. Drink one 8-10 oz glass of a clear liquid once an hour  Clear liquids include:    NO RED NO PURPLE  Water, ice               tea/coffee (sugar is ok, but no milk or cream)       juice (apple, white grape, white cranberry) clear bouillon          broth (beef/chicken/vegetable)               strained chicken noodle soup-no noodles    Jello                        Lemonade                                                            Kool-aid Popsicles               powdered fruit flavored drinks                              Gatorade                                                      Soft drinks ( coke, pepsi, sprite, mt.dew, etc)                                     hard candy   *At 6:00pm:  Step 1: Drink one 5.4 oz bottle of CLENPIQ Step 2: Drink five (5) 8 oz. glasses of clear liquid over the next 3 hours. You may continue to drink additional clear liquids until bedtime.  DAY 2  STAY ON CLEAR LIQUIDS, NO SOLID FOOD  Beginning at  9:00 AM Follow the  instructions below.   Step 1:Drink the second 5.4 oz bottle of CLENPIQ Step 2: Drink three (3) 8 oz glasses of clear liquids over the next 2 hours

## 2019-10-29 NOTE — Addendum Note (Signed)
Addended by: Alonza Bogus D on: 10/29/2019 05:19 PM   Modules accepted: Level of Service

## 2019-10-30 NOTE — Progress Notes (Signed)
____________________________________________________________  Attending physician addendum:  Thank you for sending this case to me. I have reviewed the entire note, and the outlined plan seems appropriate.  However, dicyclomine should only be used sparingly with this degree of constipation.  Yes, absolutely needs cardiology visit prior to consideration of any endoscopic procedures.  Wilfrid Lund, MD  ____________________________________________________________

## 2019-11-04 LAB — POCT INR: INR: 3.2 — AB (ref 2.0–3.0)

## 2019-11-05 ENCOUNTER — Ambulatory Visit (INDEPENDENT_AMBULATORY_CARE_PROVIDER_SITE_OTHER): Payer: Medicare Other | Admitting: Cardiology

## 2019-11-05 DIAGNOSIS — Z952 Presence of prosthetic heart valve: Secondary | ICD-10-CM

## 2019-11-05 DIAGNOSIS — Z7901 Long term (current) use of anticoagulants: Secondary | ICD-10-CM

## 2019-11-07 ENCOUNTER — Other Ambulatory Visit: Payer: Self-pay | Admitting: Family Medicine

## 2019-11-25 ENCOUNTER — Ambulatory Visit (INDEPENDENT_AMBULATORY_CARE_PROVIDER_SITE_OTHER): Payer: Medicare Other

## 2019-11-25 DIAGNOSIS — Z952 Presence of prosthetic heart valve: Secondary | ICD-10-CM | POA: Diagnosis not present

## 2019-11-25 DIAGNOSIS — Z7901 Long term (current) use of anticoagulants: Secondary | ICD-10-CM | POA: Diagnosis not present

## 2019-11-25 LAB — POCT INR: INR: 2.6 (ref 2.0–3.0)

## 2019-11-25 IMAGING — CT CT ABD-PELV W/ CM
2 of 5 series · 16 of 46 positions shown, 18 images · IV contrast (omnipaque)
Comparison: September 27, 2014.

CLINICAL DATA: Abdominal pain with blood in stool

EXAM:
CT ABDOMEN AND PELVIS WITH CONTRAST
TECHNIQUE: Multidetector CT imaging of the abdomen and pelvis was performed
using the standard protocol following bolus administration of
intravenous contrast.
CONTRAST:  100mL OMNIPAQUE IOHEXOL 300 MG/ML  SOLN

[Series 3: abd/ pelvis 5.0 i30f 2 · axial · 0.78mm/px · z∈[-479,-79]mm · 13 of 94 slices shown, 15 images]
[im 7/94  soft-tissue]
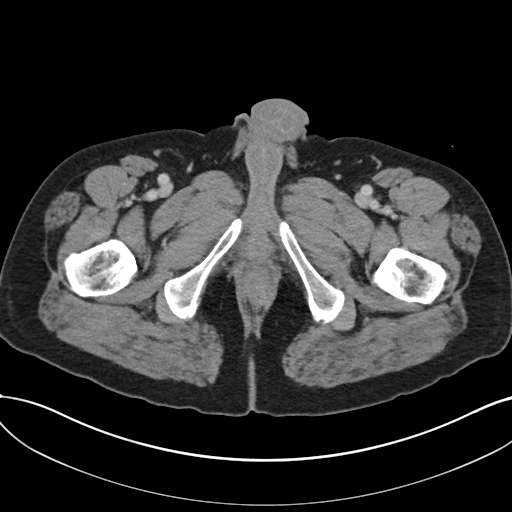
[im 7/94  bone]
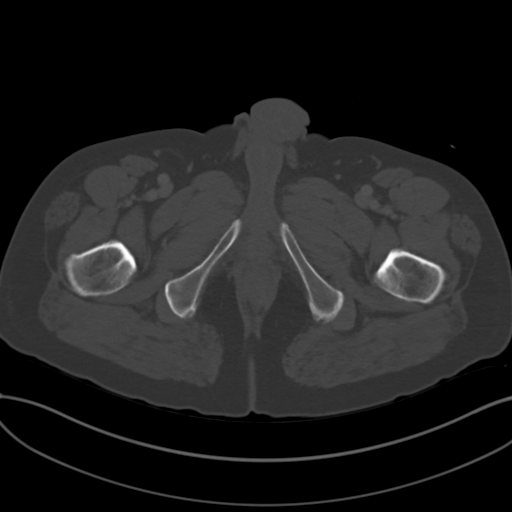
[im 13/94  soft-tissue]
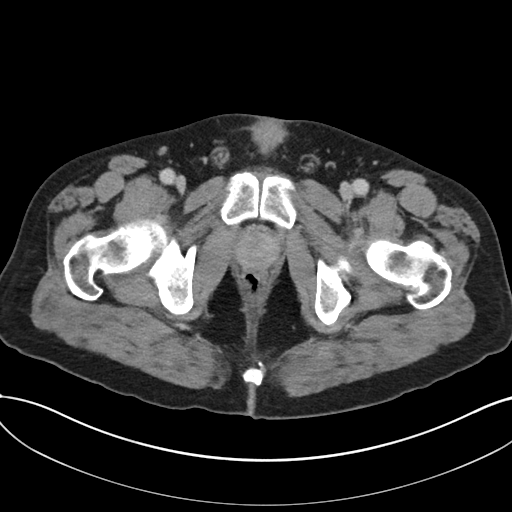
[im 19/94  soft-tissue]
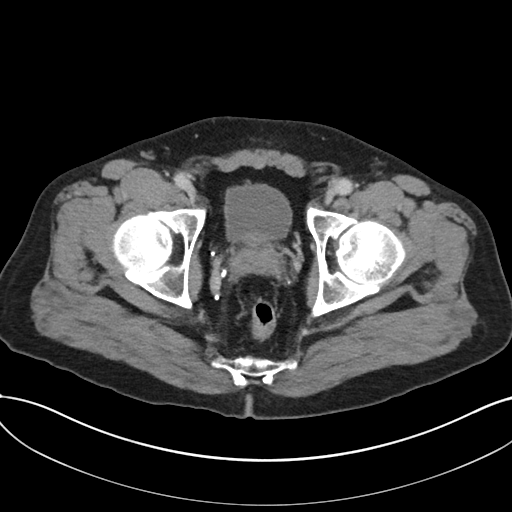
[im 25/94  soft-tissue]
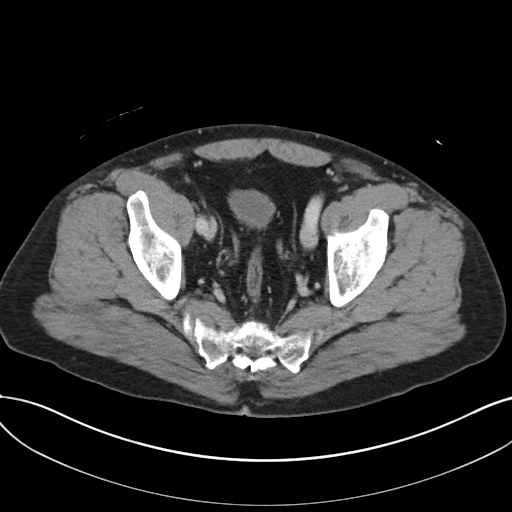
[im 32/94  soft-tissue]
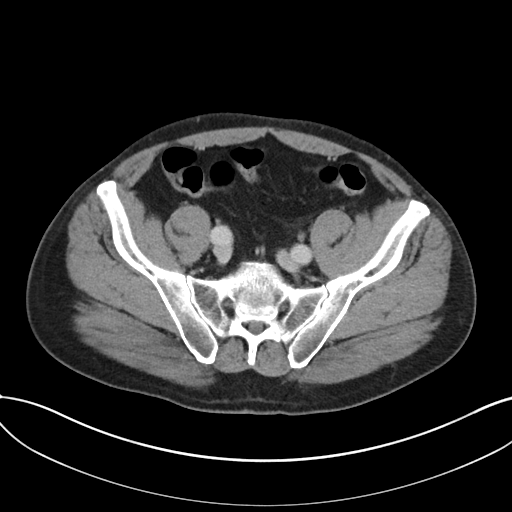
[im 38/94  soft-tissue]
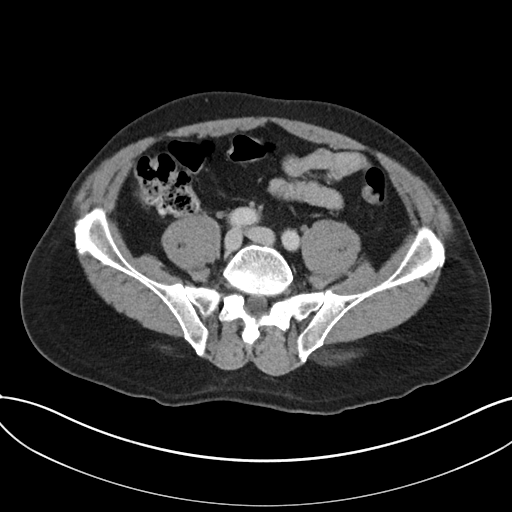
[im 50/94  soft-tissue]
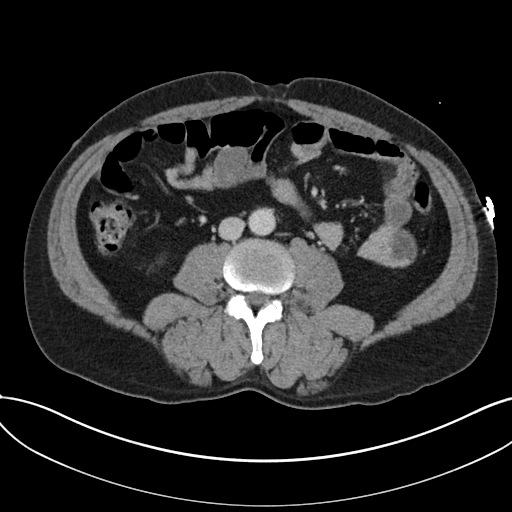
[im 56/94  soft-tissue]
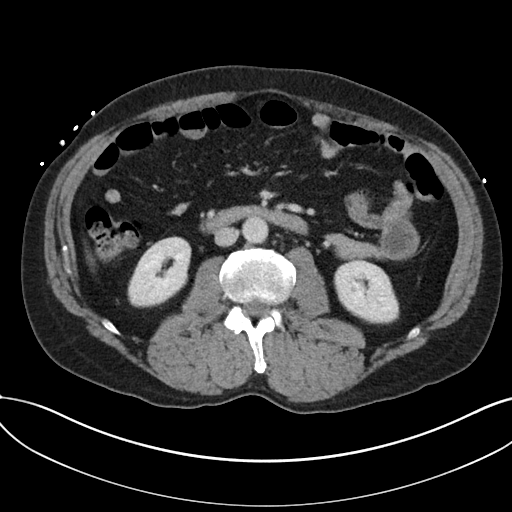
[im 63/94  soft-tissue]
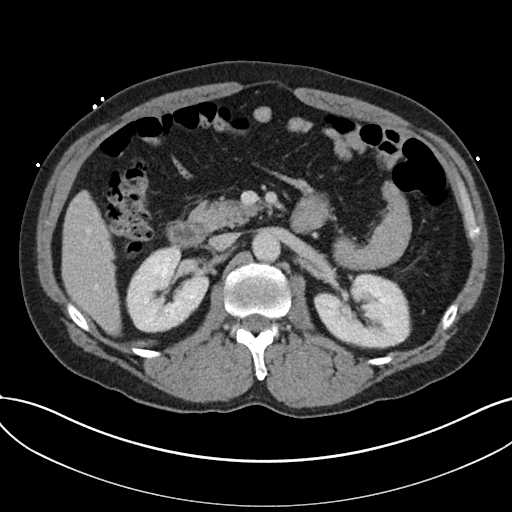
[im 63/94  bone]
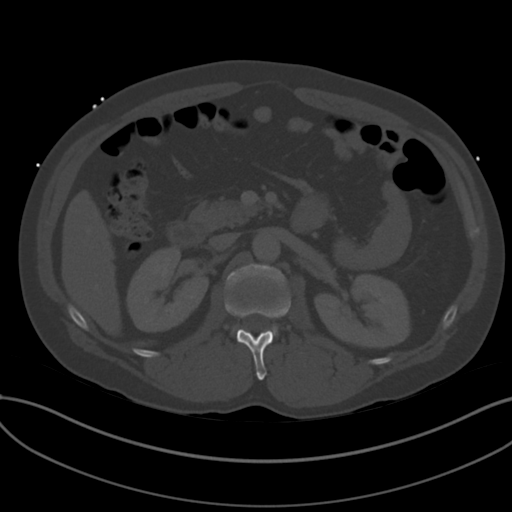
[im 69/94  soft-tissue]
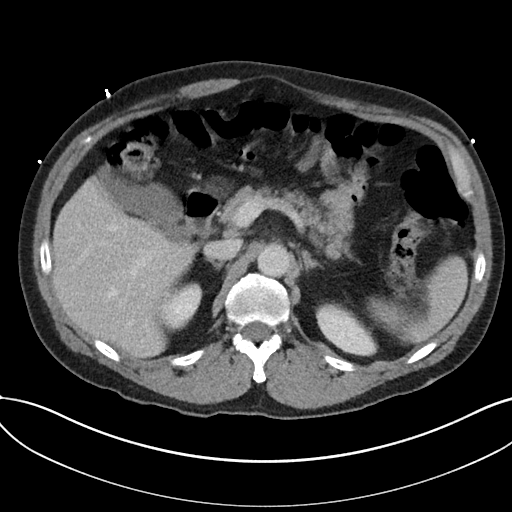
[im 75/94  soft-tissue]
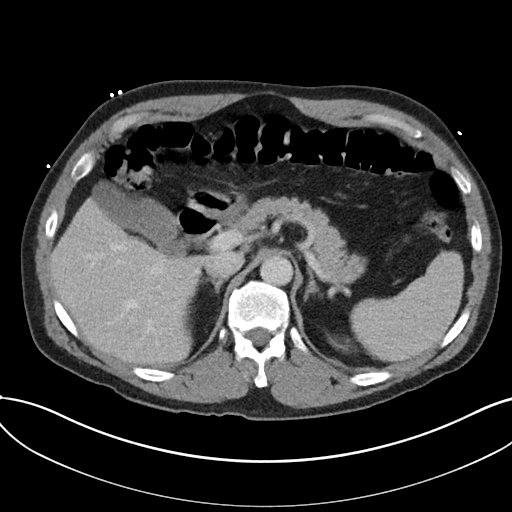
[im 81/94  soft-tissue]
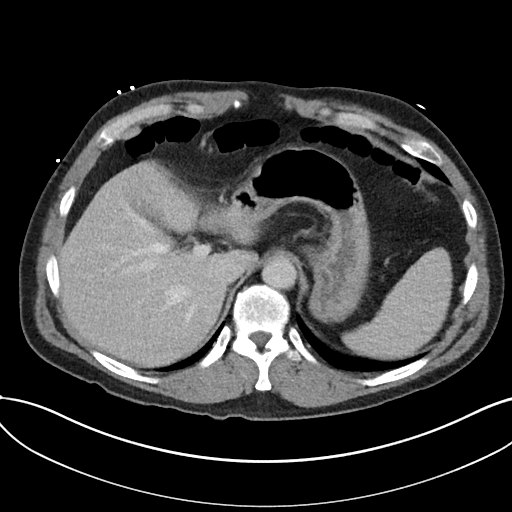
[im 87/94  soft-tissue]
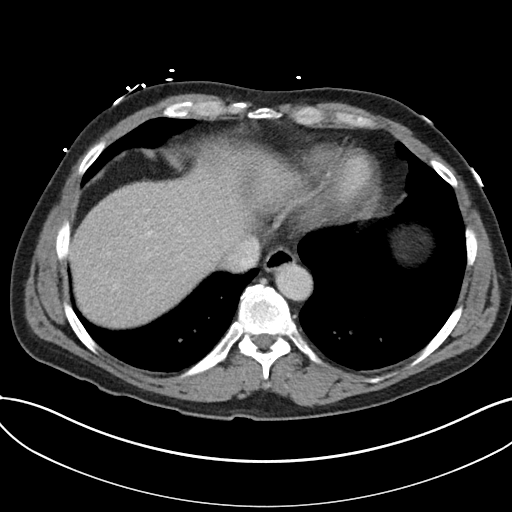

[Series 6: coronal soft tissue · coronal · 0.79mm/px · 3 of 101 slices shown]
[im 34/101  soft-tissue]
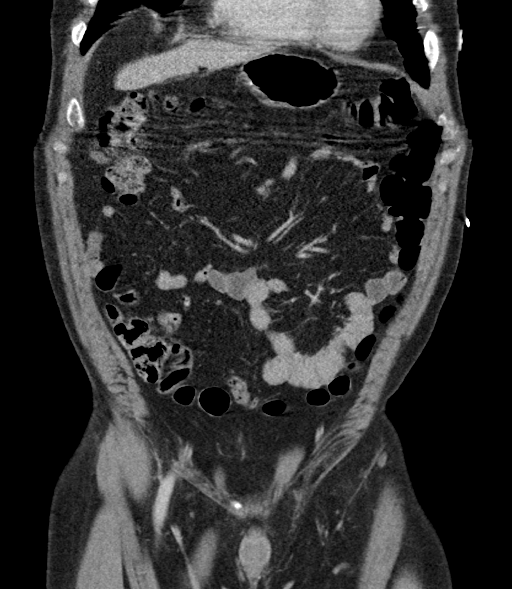
[im 45/101  soft-tissue]
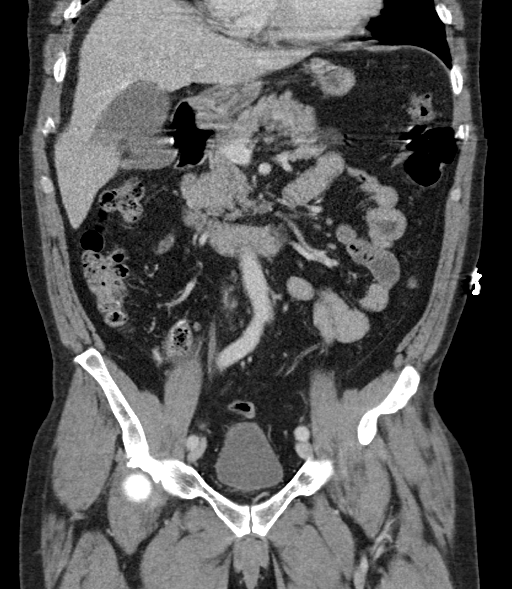
[im 56/101  soft-tissue]
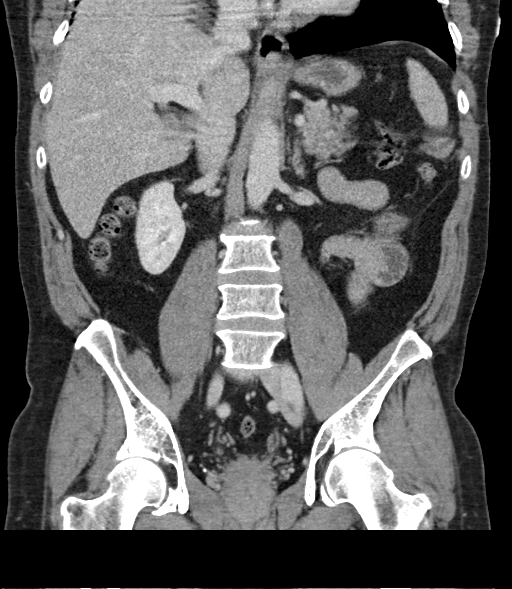

[16 of 46 positions shown; findings below may reference images not displayed]

FINDINGS: Note that there is a degree of motion artifact.

Lower chest: There is mild atelectatic change in the anterior left
lung base. No edema or consolidation evident in the lung bases.

Hepatobiliary: No focal liver lesions are appreciable. Gallbladder
wall is not appreciably thickened. There is no biliary duct
dilatation.

Pancreas: There is no pancreatic mass or inflammatory focus.

Spleen: No splenic lesions are evident.

Adrenals/Urinary Tract: Adrenals bilaterally appear unremarkable.
There is a 7 mm cyst in the mid left kidney laterally. There is no
evident hydronephrosis on either side. There is no evident renal or
ureteral calculus on either side. Urinary bladder is midline with
wall thickness within normal limits.

Stomach/Bowel: There is no appreciable bowel wall or mesenteric
thickening. Terminal ileum appears unremarkable. No evident bowel
obstruction. No free air or portal venous air evident.

Vascular/Lymphatic: There is no abdominal aortic aneurysm. No
vascular lesions are demonstrable. There is no evident adenopathy in
the abdomen or pelvis.

Reproductive: Prostate and seminal vesicles are normal in size and
contour. No pelvic masses are evident. Apparent clips in upper right
scrotal region.

Other: The appendix appears normal. There is no demonstrable abscess
or ascites in the abdomen or pelvis.

Musculoskeletal: Degenerative changes noted in each hip joint. No
blastic or lytic bone lesions are evident. There is no intramuscular
or abdominal wall lesion.
IMPRESSION: 1.  Note that there is a degree of motion artifact.

2. No bowel wall thickening or bowel obstruction evident. No abscess
in the abdomen or pelvis. Appendix appears normal. With respect to
blood in stool, would advise direct colonic visualization after
appropriate colonic cleansing.

3. No renal or ureteral calculi. No hydronephrosis. Urinary bladder
wall thickness within normal limits.

## 2019-11-27 ENCOUNTER — Other Ambulatory Visit: Payer: Self-pay | Admitting: Family Medicine

## 2019-12-02 NOTE — Telephone Encounter (Signed)
Appointment scheduled for follow up 

## 2019-12-03 ENCOUNTER — Other Ambulatory Visit: Payer: Self-pay | Admitting: Family Medicine

## 2019-12-03 DIAGNOSIS — K219 Gastro-esophageal reflux disease without esophagitis: Secondary | ICD-10-CM

## 2019-12-04 LAB — POCT INR

## 2019-12-22 ENCOUNTER — Other Ambulatory Visit: Payer: Self-pay | Admitting: Pulmonary Disease

## 2019-12-30 ENCOUNTER — Ambulatory Visit (INDEPENDENT_AMBULATORY_CARE_PROVIDER_SITE_OTHER): Payer: Medicare Other | Admitting: Cardiovascular Disease

## 2019-12-30 DIAGNOSIS — Z952 Presence of prosthetic heart valve: Secondary | ICD-10-CM

## 2019-12-30 DIAGNOSIS — Z7901 Long term (current) use of anticoagulants: Secondary | ICD-10-CM

## 2019-12-30 LAB — POCT INR: INR: 2.4 (ref 2.0–3.0)

## 2019-12-31 ENCOUNTER — Other Ambulatory Visit: Payer: Self-pay | Admitting: Family Medicine

## 2020-01-04 ENCOUNTER — Telehealth: Payer: Self-pay | Admitting: Family Medicine

## 2020-01-04 ENCOUNTER — Other Ambulatory Visit: Payer: Self-pay

## 2020-01-04 NOTE — Telephone Encounter (Signed)
Attempted to schedule AWV. Unable to LVM.  Will try at later time.  

## 2020-01-05 ENCOUNTER — Encounter: Payer: Self-pay | Admitting: Family Medicine

## 2020-01-05 ENCOUNTER — Ambulatory Visit (INDEPENDENT_AMBULATORY_CARE_PROVIDER_SITE_OTHER): Payer: Medicare Other | Admitting: Family Medicine

## 2020-01-05 VITALS — BP 108/64 | HR 86 | Temp 97.6°F | Ht 67.0 in | Wt 206.4 lb

## 2020-01-05 DIAGNOSIS — H60391 Other infective otitis externa, right ear: Secondary | ICD-10-CM

## 2020-01-05 DIAGNOSIS — H6983 Other specified disorders of Eustachian tube, bilateral: Secondary | ICD-10-CM | POA: Diagnosis not present

## 2020-01-05 DIAGNOSIS — H6090 Unspecified otitis externa, unspecified ear: Secondary | ICD-10-CM | POA: Insufficient documentation

## 2020-01-05 MED ORDER — PREDNISONE 10 MG PO TABS: ORAL_TABLET | ORAL | 0 refills | Status: DC

## 2020-01-05 MED ORDER — NEOMYCIN-POLYMYXIN-HC 3.5-10000-1 OT SUSP: 4.0000 [drp] | Freq: Four times a day (QID) | OTIC | 0 refills | Status: DC

## 2020-01-05 NOTE — Progress Notes (Signed)
New Patient Office Visit  Subjective:  Patient ID: Max Ward, male    DOB: 04-06-1971  Age: 49 y.o. MRN: [id number]  CC:  Chief Complaint  Patient presents with  . Follow-up    follow up on asthma, patient c/o right ear pain and lots of pressure in head    HPI Max Ward presents for follow-up of ongoing ear congestion and discomfort.  Patient has postnasal drip with bilateral ear congestion with more pain in the right ear than the left.  Hearing has been affected negatively.  History of allergy rhinitis and currently taking Flonase for this condition.  History of asthma and taking Advair.  He does smoke.  He uses his Flonase intermittently.  Claritin-D had helped in the past mostly but he is unable to take decongestants secondary to his history of heart arrhythmias.  Past Medical History:  Diagnosis Date  . Anal fissure   . Anemia   . Anxiety   . Arthritis   . Asthma   . Bipolar 1 disorder ([place])   . Complication of anesthesia    woke up during colonoscoy  . DDD (degenerative disc disease), cervical   . Depression   . DJD (degenerative joint disease)   . GERD (gastroesophageal reflux disease)   . HLD (hyperlipidemia)   . Hypertension   . SVT (supraventricular tachycardia) (HCC)    s/p prior ablation by Dr Seabron Spates at [facility] for [place]    Past Surgical History:  Procedure Laterality Date  . BONE TUMOR EXCISION     skull  . CARDIAC CATHETERIZATION  2018   [facility] reveals normal cors  . GANGLION CYST EXCISION Right 09/03/2018   Procedure: REMOVAL GANGLION OF RIGHT WRIST AND RIGHT ELBOW;  Surgeon: Leandrew Koyanagi, MD;  Location: [facility];  Service: Orthopedics;  Laterality: Right;  OTHER SITE IS ELBOW  . MECHANICAL AORTIC VALVE REPLACEMENT      Family History  Problem Relation Age of Onset  . Lung cancer Mother   . Emphysema Father   . Diabetes Paternal Grandmother   . Cancer Paternal Uncle   . Brain cancer Maternal Aunt     Social History    Socioeconomic History  . Marital status: Legally Separated    Spouse name: Not on file  . Number of children: 2  . Years of education: Not on file  . Highest education level: Not on file  Occupational History  . Occupation: disabled  Tobacco Use  . Smoking status: Current Every Day Smoker    Packs/day: 1.00    Years: 15.00    Pack years: 15.00    Types: Cigarettes  . Smokeless tobacco: Never Used  . Tobacco comment: 1/2 pack  or less per day Jan 2020  Substance and Sexual Activity  . Alcohol use: Yes    Comment: 1-3 beers on occ.   . Drug use: No  . Sexual activity: Not on file  Other Topics Concern  . Not on file  Social History Narrative   Lives in [place] with a friend.     disabled   Social Determinants of Health   Financial Resource Strain:   . Difficulty of Paying Living Expenses:   Food Insecurity:   . Worried About Charity fundraiser in the Last Year:   . Arboriculturist in the Last Year:   Transportation Needs:   . Film/video editor (Medical):   Marland Kitchen Lack of Transportation (Non-Medical):   Physical Activity:   .  Days of Exercise per Week:   . Minutes of Exercise per Session:   Stress:   . Feeling of Stress :   Social Connections:   . Frequency of Communication with Friends and Family:   . Frequency of Social Gatherings with Friends and Family:   . Attends Religious Services:   . Active Member of Clubs or Organizations:   . Attends Archivist Meetings:   Marland Kitchen Marital Status:   Intimate Partner Violence:   . Fear of Current or Ex-Partner:   . Emotionally Abused:   Marland Kitchen Physically Abused:   . Sexually Abused:     ROS Review of Systems  Constitutional: Negative for chills, diaphoresis, fatigue, fever and unexpected weight change.  HENT: Positive for congestion, ear pain, hearing loss and postnasal drip. Negative for ear discharge, rhinorrhea and sinus pain.   Eyes: Negative for photophobia and visual disturbance.  Respiratory: Negative.   Negative for cough, chest tightness and wheezing.   Cardiovascular: Negative.   Gastrointestinal: Negative.   Genitourinary: Negative.   Neurological: Negative.     Objective:   Today's Vitals: BP 108/64   Pulse 86   Temp 97.6 F (36.4 C) (Tympanic)   Ht 5\' 7"  (1.702 m)   Wt 206 lb 6.4 oz (93.6 kg)   SpO2 94%   BMI 32.33 kg/m   Physical Exam Vitals and nursing note reviewed.  Constitutional:      General: He is not in acute distress.    Appearance: Normal appearance. He is normal weight. He is not ill-appearing or toxic-appearing.  HENT:     Head: Normocephalic and atraumatic.     Right Ear: External ear normal. No middle ear effusion. There is no impacted cerumen. Tympanic membrane is retracted. Tympanic membrane is not injected, scarred, perforated, erythematous or bulging.     Left Ear: External ear normal.  No middle ear effusion. There is no impacted cerumen. Tympanic membrane is retracted. Tympanic membrane is not injected, scarred, perforated, erythematous or bulging.     Ears:   Eyes:     General:        Right eye: No discharge.        Left eye: No discharge.     Extraocular Movements: Extraocular movements intact.     Conjunctiva/sclera: Conjunctivae normal.     Pupils: Pupils are equal, round, and reactive to light.  Cardiovascular:     Rate and Rhythm: Normal rate and regular rhythm.  Pulmonary:     Effort: Pulmonary effort is normal. No respiratory distress.     Breath sounds: Normal breath sounds. No stridor. No wheezing or rhonchi.  Abdominal:     General: Bowel sounds are normal.  Musculoskeletal:     Cervical back: No rigidity or tenderness.  Lymphadenopathy:     Cervical: No cervical adenopathy.  Skin:    General: Skin is warm and dry.  Neurological:     Mental Status: He is alert and oriented to person, place, and time.  Psychiatric:        Mood and Affect: Mood normal.        Behavior: Behavior normal.     Assessment & Plan:   Problem  List Items Addressed This Visit      Nervous and Auditory   Dysfunction of both eustachian tubes - Primary   Relevant Medications   predniSONE (DELTASONE) 10 MG tablet   Otitis externa   Relevant Medications   neomycin-polymyxin-hydrocortisone (CORTISPORIN) 3.5-10000-1 OTIC suspension  Outpatient Encounter Medications as of 01/05/2020  Medication Sig  . ADVAIR DISKUS 250-50 MCG/DOSE AEPB INHALE 1 PUFF BY MOUTH TWICE A DAY  . albuterol (VENTOLIN HFA) 108 (90 Base) MCG/ACT inhaler INHALE 1-2 PUFFS BY MOUTH EVERY 4-6 HOURS AS NEEDED AND AS DIRECTED  . alprazolam (XANAX) 2 MG tablet Take 2 mg by mouth 4 (four) times daily as needed for sleep.  Marland Kitchen amphetamine-dextroamphetamine (ADDERALL XR) 25 MG 24 hr capsule Take 1 capsule by mouth 2 (two) times daily.  . Asenapine Maleate (SAPHRIS) 10 MG SUBL Place 30 mg under the tongue at bedtime.  Marland Kitchen azelastine (ASTELIN) 0.1 % nasal spray Place 1 spray into both nostrils 2 (two) times daily. Use in each nostril as directed  . dicyclomine (BENTYL) 10 MG capsule Take 1 capsule (10 mg total) by mouth 2 (two) times daily.  Marland Kitchen esomeprazole (NEXIUM) 40 MG capsule Take 1 capsule (40 mg total) by mouth daily at 12 noon.  . flecainide (TAMBOCOR) 50 MG tablet TAKE 1 TABLET BY MOUTH TWICE A DAY  . fluticasone (FLONASE) 50 MCG/ACT nasal spray Place 1 spray into both nostrils daily.  . hydrOXYzine (VISTARIL) 50 MG capsule Take 1 capsule by mouth as needed for anxiety.  . metoprolol succinate (TOPROL-XL) 50 MG 24 hr tablet Take 50 mg by mouth daily. Take with or immediately following a meal.   . omeprazole (PRILOSEC) 40 MG capsule TAKE 1 CAPSULE BY MOUTH TWICE A DAY  . ranolazine (RANEXA) 500 MG 12 hr tablet Take 1 tablet by mouth 2 (two) times daily.  Marland Kitchen warfarin (COUMADIN) 5 MG tablet Take 1 to 1 and 1/2 tablet daily as directed by coumadin clinic  . montelukast (SINGULAIR) 10 MG tablet TAKE 1 TABLET BY MOUTH EVERY DAY (Patient not taking: Reported on 01/05/2020)  .  neomycin-polymyxin-hydrocortisone (CORTISPORIN) 3.5-10000-1 OTIC suspension Place 4 drops into the right ear 4 (four) times daily.  . predniSONE (DELTASONE) 10 MG tablet Take 2 twice daily for 7 days and then one twice daily for 4 days and stop.  . traMADol (ULTRAM) 50 MG tablet TAKE 1 TABLET TWICE A DAY AS NEEDED FOR PAIN (Patient not taking: Reported on 01/05/2020)  . traZODone (DESYREL) 100 MG tablet Take 300 mg by mouth at bedtime.   No facility-administered encounter medications on file as of 01/05/2020.    Follow-up: Return will see ENT next month. Please stop smoking..   Continue flonase and see ENT next month. Please stop smoking.   Libby Maw, MD

## 2020-01-05 NOTE — Patient Instructions (Signed)
Eustachian Tube Dysfunction  Eustachian tube dysfunction refers to a condition in which a blockage develops in the narrow passage that connects the middle ear to the back of the nose (eustachian tube). The eustachian tube regulates air pressure in the middle ear by letting air move between the ear and nose. It also helps to drain fluid from the middle ear space. Eustachian tube dysfunction can affect one or both ears. When the eustachian tube does not function properly, air pressure, fluid, or both can build up in the middle ear. What are the causes? This condition occurs when the eustachian tube becomes blocked or cannot open normally. Common causes of this condition include:  Ear infections.  Colds and other infections that affect the nose, mouth, and throat (upper respiratory tract).  Allergies.  Irritation from cigarette smoke.  Irritation from stomach acid coming up into the esophagus (gastroesophageal reflux). The esophagus is the tube that carries food from the mouth to the stomach.  Sudden changes in air pressure, such as from descending in an airplane or scuba diving.  Abnormal growths in the nose or throat, such as: ? Growths that line the nose (nasal polyps). ? Abnormal growth of cells (tumors). ? Enlarged tissue at the back of the throat (adenoids). What increases the risk? You are more likely to develop this condition if:  You smoke.  You are overweight.  You are a child who has: ? Certain birth defects of the mouth, such as cleft palate. ? Large tonsils or adenoids. What are the signs or symptoms? Common symptoms of this condition include:  A feeling of fullness in the ear.  Ear pain.  Clicking or popping noises in the ear.  Ringing in the ear.  Hearing loss.  Loss of balance.  Dizziness. Symptoms may get worse when the air pressure around you changes, such as when you travel to an area of high elevation, fly on an airplane, or go scuba diving. How is  this diagnosed? This condition may be diagnosed based on:  Your symptoms.  A physical exam of your ears, nose, and throat.  Tests, such as those that measure: ? The movement of your eardrum (tympanogram). ? Your hearing (audiometry). How is this treated? Treatment depends on the cause and severity of your condition.  In mild cases, you may relieve your symptoms by moving air into your ears. This is called "popping the ears."  In more severe cases, or if you have symptoms of fluid in your ears, treatment may include: ? Medicines to relieve congestion (decongestants). ? Medicines that treat allergies (antihistamines). ? Nasal sprays or ear drops that contain medicines that reduce swelling (steroids). ? A procedure to drain the fluid in your eardrum (myringotomy). In this procedure, a small tube is placed in the eardrum to:  Drain the fluid.  Restore the air in the middle ear space. ? A procedure to insert a balloon device through the nose to inflate the opening of the eustachian tube (balloon dilation). Follow these instructions at home: Lifestyle  Do not do any of the following until your health care provider approves: ? Travel to high altitudes. ? Fly in airplanes. ? Work in a pressurized cabin or room. ? Scuba dive.  Do not use any products that contain nicotine or tobacco, such as cigarettes and e-cigarettes. If you need help quitting, ask your health care provider.  Keep your ears dry. Wear fitted earplugs during showering and bathing. Dry your ears completely after. General instructions  Take over-the-counter   and prescription medicines only as told by your health care provider.  Use techniques to help pop your ears as recommended by your health care provider. These may include: ? Chewing gum. ? Yawning. ? Frequent, forceful swallowing. ? Closing your mouth, holding your nose closed, and gently blowing as if you are trying to blow air out of your nose.  Keep all  follow-up visits as told by your health care provider. This is important. Contact a health care provider if:  Your symptoms do not go away after treatment.  Your symptoms come back after treatment.  You are unable to pop your ears.  You have: ? A fever. ? Pain in your ear. ? Pain in your head or neck. ? Fluid draining from your ear.  Your hearing suddenly changes.  You become very dizzy.  You lose your balance. Summary  Eustachian tube dysfunction refers to a condition in which a blockage develops in the eustachian tube.  It can be caused by ear infections, allergies, inhaled irritants, or abnormal growths in the nose or throat.  Symptoms include ear pain, hearing loss, or ringing in the ears.  Mild cases are treated with maneuvers to unblock the ears, such as yawning or ear popping.  Severe cases are treated with medicines. Surgery may also be done (rare). This information is not intended to replace advice given to you by your health care provider. Make sure you discuss any questions you have with your health care provider. Document Revised: 01/21/2018 Document Reviewed: 01/21/2018 Elsevier Patient Education  2020 [place] with Quitting Smoking  Quitting smoking is a physical and mental challenge. You will face cravings, withdrawal symptoms, and temptation. Before quitting, work with your health care provider to make a plan that can help you cope. Preparation can help you quit and keep you from giving in. How can I cope with cravings? Cravings usually last for 5-10 minutes. If you get through it, the craving will pass. Consider taking the following actions to help you cope with cravings:  Keep your mouth busy: ? Chew sugar-free gum. ? Suck on hard candies or a straw. ? Brush your teeth.  Keep your hands and body busy: ? Immediately change to a different activity when you feel a craving. ? Squeeze or play with a ball. ? Do an activity or a hobby, like  making bead jewelry, practicing needlepoint, or working with wood. ? Mix up your normal routine. ? Take a short exercise break. Go for a quick walk or run up and down stairs. ? Spend time in public places where smoking is not allowed.  Focus on doing something kind or helpful for someone else.  Call a friend or family member to talk during a craving.  Join a support group.  Call a quit line, such as 1-800-QUIT-NOW.  Talk with your health care provider about medicines that might help you cope with cravings and make quitting easier for you. How can I deal with withdrawal symptoms? Your body may experience negative effects as it tries to get used to not having nicotine in the system. These effects are called withdrawal symptoms. They may include:  Feeling hungrier than normal.  Trouble concentrating.  Irritability.  Trouble sleeping.  Feeling depressed.  Restlessness and agitation.  Craving a cigarette. To manage withdrawal symptoms:  Avoid places, people, and activities that trigger your cravings.  Remember why you want to quit.  Get plenty of sleep.  Avoid coffee and other caffeinated drinks. These may  worsen some of your symptoms. How can I handle social situations? Social situations can be difficult when you are quitting smoking, especially in the first few weeks. To manage this, you can:  Avoid parties, bars, and other social situations where people might be smoking.  Avoid alcohol.  Leave right away if you have the urge to smoke.  Explain to your family and friends that you are quitting smoking. Ask for understanding and support.  Plan activities with friends or family where smoking is not an option. What are some ways I can cope with stress? Wanting to smoke may cause stress, and stress can make you want to smoke. Find ways to manage your stress. Relaxation techniques can help. For example:  Breathe slowly and deeply, in through your nose and out through your  mouth.  Listen to soothing, relaxing music.  Talk with a family member or friend about your stress.  Light a candle.  Soak in a bath or take a shower.  Think about a peaceful place. What are some ways I can prevent weight gain? Be aware that many people gain weight after they quit smoking. However, not everyone does. To keep from gaining weight, have a plan in place before you quit and stick to the plan after you quit. Your plan should include:  Having healthy snacks. When you have a craving, it may help to: ? Eat plain popcorn, crunchy carrots, celery, or other cut vegetables. ? Chew sugar-free gum.  Changing how you eat: ? Eat small portion sizes at meals. ? Eat 4-6 small meals throughout the day instead of 1-2 large meals a day. ? Be mindful when you eat. Do not watch television or do other things that might distract you as you eat.  Exercising regularly: ? Make time to exercise each day. If you do not have time for a long workout, do short bouts of exercise for 5-10 minutes several times a day. ? Do some form of strengthening exercise, like weight lifting, and some form of aerobic exercise, like running or swimming.  Drinking plenty of water or other low-calorie or no-calorie drinks. Drink 6-8 glasses of water daily, or as much as instructed by your health care provider. Summary  Quitting smoking is a physical and mental challenge. You will face cravings, withdrawal symptoms, and temptation to smoke again. Preparation can help you as you go through these challenges.  You can cope with cravings by keeping your mouth busy (such as by chewing gum), keeping your body and hands busy, and making calls to family, friends, or a helpline for people who want to quit smoking.  You can cope with withdrawal symptoms by avoiding places where people smoke, avoiding drinks with caffeine, and getting plenty of rest.  Ask your health care provider about the different ways to prevent weight  gain, avoid stress, and handle social situations. This information is not intended to replace advice given to you by your health care provider. Make sure you discuss any questions you have with your health care provider. Document Revised: 09/13/2017 Document Reviewed: 09/28/2016 Elsevier Patient Education  2020 [employer].

## 2020-01-06 LAB — POCT INR: INR: 2.7 (ref 2.0–3.0)

## 2020-01-07 ENCOUNTER — Ambulatory Visit (INDEPENDENT_AMBULATORY_CARE_PROVIDER_SITE_OTHER): Payer: Medicare Other | Admitting: Cardiovascular Disease

## 2020-01-07 DIAGNOSIS — Z952 Presence of prosthetic heart valve: Secondary | ICD-10-CM | POA: Diagnosis not present

## 2020-01-07 DIAGNOSIS — Z7901 Long term (current) use of anticoagulants: Secondary | ICD-10-CM

## 2020-01-14 ENCOUNTER — Encounter: Payer: Self-pay | Admitting: Nurse Practitioner

## 2020-01-14 ENCOUNTER — Ambulatory Visit (INDEPENDENT_AMBULATORY_CARE_PROVIDER_SITE_OTHER): Payer: Medicare Other | Admitting: Nurse Practitioner

## 2020-01-14 ENCOUNTER — Other Ambulatory Visit: Payer: Self-pay

## 2020-01-14 VITALS — BP 146/79 | HR 74 | Ht 67.0 in | Wt 203.0 lb

## 2020-01-14 DIAGNOSIS — J302 Other seasonal allergic rhinitis: Secondary | ICD-10-CM | POA: Diagnosis not present

## 2020-01-14 DIAGNOSIS — J029 Acute pharyngitis, unspecified: Secondary | ICD-10-CM

## 2020-01-14 MED ORDER — CETIRIZINE HCL 10 MG PO TABS: 10.0000 mg | ORAL_TABLET | Freq: Every day | ORAL | 0 refills | Status: DC

## 2020-01-14 NOTE — Patient Instructions (Signed)

## 2020-01-14 NOTE — Progress Notes (Signed)
Virtual Visit via Video Note  I connected with@ on 01/14/20 at 11:00 AM EDT by a video enabled telemedicine application and verified that I am speaking with the correct person using two identifiers.  Location: Patient:Home Provider: Office Participants: patient and provider  I discussed the limitations of evaluation and management by telemedicine and the availability of in person appointments. I also discussed with the patient that there may be a patient responsible charge related to this service. The patient expressed understanding and agreed to proceed.  CC:pt c/o of sore throat,white patches in the back of mouth,sinus is red, not better since last ov with Dr. Jerilynn Mages prednison and will see ENT next month.   History of Present Illness: Onset after cutting grass for last 2days. Sore Throat  This is a new problem. The current episode started in the past 7 days. The problem has been gradually improving. There has been no fever. Associated symptoms include congestion. Pertinent negatives include no coughing, headaches, hoarse voice, plugged ear sensation, neck pain, shortness of breath, stridor, swollen glands, trouble swallowing or vomiting. He has had no exposure to strep or mono. He has tried nothing for the symptoms.   Observations/Objective: Physical Exam  Constitutional: He is oriented to person, place, and time.  HENT:  Right Ear: External ear normal.  Left Ear: External ear normal.  Mouth/Throat: Oropharynx is clear and moist. No oropharyngeal exudate.  Musculoskeletal:     Cervical back: Normal range of motion and neck supple.  Neurological: He is alert and oriented to person, place, and time.   Assessment and Plan: Max Ward was seen today for sore throat.  Diagnoses and all orders for this visit:  Sore throat -     cetirizine (ZYRTEC) 10 MG tablet; Take 1 tablet (10 mg total) by mouth at bedtime.  Seasonal allergic rhinitis, unspecified trigger -      cetirizine (ZYRTEC) 10 MG tablet; Take 1 tablet (10 mg total) by mouth at bedtime.    Follow Up Instructions: See avs   I discussed the assessment and treatment plan with the patient. The patient was provided an opportunity to ask questions and all were answered. The patient agreed with the plan and demonstrated an understanding of the instructions.   The patient was advised to call back or seek an in-person evaluation if the symptoms worsen or if the condition fails to improve as anticipated.   Wilfred Lacy, NP

## 2020-01-19 LAB — POCT INR: INR: 4 — AB (ref 2.0–3.0)

## 2020-01-20 ENCOUNTER — Ambulatory Visit (INDEPENDENT_AMBULATORY_CARE_PROVIDER_SITE_OTHER): Payer: Medicare Other | Admitting: Cardiology

## 2020-01-20 DIAGNOSIS — Z7901 Long term (current) use of anticoagulants: Secondary | ICD-10-CM | POA: Diagnosis not present

## 2020-01-20 DIAGNOSIS — Z952 Presence of prosthetic heart valve: Secondary | ICD-10-CM

## 2020-01-28 ENCOUNTER — Other Ambulatory Visit: Payer: Self-pay | Admitting: Family Medicine

## 2020-01-28 ENCOUNTER — Other Ambulatory Visit: Payer: Self-pay

## 2020-01-29 ENCOUNTER — Other Ambulatory Visit: Payer: Self-pay | Admitting: Nurse Practitioner

## 2020-01-29 DIAGNOSIS — J302 Other seasonal allergic rhinitis: Secondary | ICD-10-CM

## 2020-01-29 DIAGNOSIS — J029 Acute pharyngitis, unspecified: Secondary | ICD-10-CM

## 2020-02-05 ENCOUNTER — Other Ambulatory Visit: Payer: Self-pay | Admitting: Nurse Practitioner

## 2020-02-05 DIAGNOSIS — J029 Acute pharyngitis, unspecified: Secondary | ICD-10-CM

## 2020-02-05 DIAGNOSIS — J302 Other seasonal allergic rhinitis: Secondary | ICD-10-CM

## 2020-02-11 ENCOUNTER — Ambulatory Visit (INDEPENDENT_AMBULATORY_CARE_PROVIDER_SITE_OTHER): Payer: Medicare Other | Admitting: Cardiology

## 2020-02-11 DIAGNOSIS — Z952 Presence of prosthetic heart valve: Secondary | ICD-10-CM

## 2020-02-11 DIAGNOSIS — Z7901 Long term (current) use of anticoagulants: Secondary | ICD-10-CM | POA: Diagnosis not present

## 2020-02-11 LAB — POCT INR: INR: 4.2 — AB (ref 2.0–3.0)

## 2020-02-15 ENCOUNTER — Other Ambulatory Visit: Payer: Self-pay | Admitting: Family Medicine

## 2020-02-15 MED ORDER — WARFARIN SODIUM 5 MG PO TABS: ORAL_TABLET | ORAL | 0 refills | Status: DC

## 2020-03-03 LAB — POCT INR: INR: 2.5 (ref 2.0–3.0)

## 2020-03-04 ENCOUNTER — Other Ambulatory Visit: Payer: Self-pay | Admitting: Nurse Practitioner

## 2020-03-04 ENCOUNTER — Ambulatory Visit (INDEPENDENT_AMBULATORY_CARE_PROVIDER_SITE_OTHER): Payer: Medicare Other | Admitting: Pharmacist

## 2020-03-04 DIAGNOSIS — Z952 Presence of prosthetic heart valve: Secondary | ICD-10-CM | POA: Diagnosis not present

## 2020-03-04 DIAGNOSIS — J029 Acute pharyngitis, unspecified: Secondary | ICD-10-CM

## 2020-03-04 DIAGNOSIS — J302 Other seasonal allergic rhinitis: Secondary | ICD-10-CM

## 2020-03-04 DIAGNOSIS — Z7901 Long term (current) use of anticoagulants: Secondary | ICD-10-CM | POA: Diagnosis not present

## 2020-04-05 LAB — POCT INR: INR: 3 (ref 2.0–3.0)

## 2020-04-06 ENCOUNTER — Ambulatory Visit (INDEPENDENT_AMBULATORY_CARE_PROVIDER_SITE_OTHER): Payer: Medicare Other | Admitting: Cardiology

## 2020-04-06 DIAGNOSIS — Z7901 Long term (current) use of anticoagulants: Secondary | ICD-10-CM | POA: Diagnosis not present

## 2020-04-06 DIAGNOSIS — Z952 Presence of prosthetic heart valve: Secondary | ICD-10-CM | POA: Diagnosis not present

## 2020-04-17 ENCOUNTER — Other Ambulatory Visit: Payer: Self-pay | Admitting: Family Medicine

## 2020-04-17 ENCOUNTER — Other Ambulatory Visit: Payer: Self-pay | Admitting: Internal Medicine

## 2020-04-25 ENCOUNTER — Other Ambulatory Visit: Payer: Self-pay | Admitting: Gastroenterology

## 2020-04-27 ENCOUNTER — Telehealth: Payer: Self-pay

## 2020-04-27 NOTE — Telephone Encounter (Signed)
lmom for overdue inr 

## 2020-05-02 LAB — POCT INR: INR: 2 (ref 2.0–3.0)

## 2020-05-03 ENCOUNTER — Ambulatory Visit (INDEPENDENT_AMBULATORY_CARE_PROVIDER_SITE_OTHER): Payer: Medicare Other | Admitting: Pharmacist

## 2020-05-03 DIAGNOSIS — Z952 Presence of prosthetic heart valve: Secondary | ICD-10-CM | POA: Diagnosis not present

## 2020-05-03 DIAGNOSIS — Z7901 Long term (current) use of anticoagulants: Secondary | ICD-10-CM

## 2020-05-09 ENCOUNTER — Other Ambulatory Visit: Payer: Self-pay | Admitting: Family Medicine

## 2020-05-16 ENCOUNTER — Ambulatory Visit (INDEPENDENT_AMBULATORY_CARE_PROVIDER_SITE_OTHER): Payer: Medicare Other | Admitting: Cardiology

## 2020-05-16 DIAGNOSIS — Z952 Presence of prosthetic heart valve: Secondary | ICD-10-CM | POA: Diagnosis not present

## 2020-05-16 DIAGNOSIS — Z7901 Long term (current) use of anticoagulants: Secondary | ICD-10-CM | POA: Diagnosis not present

## 2020-05-16 LAB — POCT INR: INR: 2.4 (ref 2.0–3.0)

## 2020-06-02 ENCOUNTER — Other Ambulatory Visit: Payer: Self-pay | Admitting: Family Medicine

## 2020-06-05 LAB — POCT INR: INR: 2.8 (ref 2.0–3.0)

## 2020-06-06 ENCOUNTER — Other Ambulatory Visit: Payer: Self-pay | Admitting: Internal Medicine

## 2020-06-06 ENCOUNTER — Ambulatory Visit (INDEPENDENT_AMBULATORY_CARE_PROVIDER_SITE_OTHER): Payer: Medicare Other | Admitting: Cardiology

## 2020-06-06 DIAGNOSIS — Z7901 Long term (current) use of anticoagulants: Secondary | ICD-10-CM

## 2020-06-06 DIAGNOSIS — Z952 Presence of prosthetic heart valve: Secondary | ICD-10-CM

## 2020-06-06 NOTE — Patient Instructions (Signed)
Description   Called pt and advised him to take 2.5mg  today, then continue taking 5mg  daily. Recheck INR in 2 weeks - self tester.

## 2020-06-07 ENCOUNTER — Ambulatory Visit (INDEPENDENT_AMBULATORY_CARE_PROVIDER_SITE_OTHER): Payer: Medicare Other

## 2020-06-07 ENCOUNTER — Ambulatory Visit: Payer: Self-pay

## 2020-06-07 ENCOUNTER — Ambulatory Visit (INDEPENDENT_AMBULATORY_CARE_PROVIDER_SITE_OTHER): Payer: Medicare Other | Admitting: Orthopaedic Surgery

## 2020-06-07 DIAGNOSIS — M25561 Pain in right knee: Secondary | ICD-10-CM

## 2020-06-07 DIAGNOSIS — M25562 Pain in left knee: Secondary | ICD-10-CM

## 2020-06-07 DIAGNOSIS — M25511 Pain in right shoulder: Secondary | ICD-10-CM | POA: Diagnosis not present

## 2020-06-07 DIAGNOSIS — G8929 Other chronic pain: Secondary | ICD-10-CM

## 2020-06-07 MED ORDER — LIDOCAINE HCL 1 % IJ SOLN
3.0000 mL | INTRAMUSCULAR | Status: AC | PRN
  Administered 2020-06-07: 3 mL

## 2020-06-07 MED ORDER — METHYLPREDNISOLONE ACETATE 40 MG/ML IJ SUSP
13.3300 mg | INTRAMUSCULAR | Status: AC | PRN
  Administered 2020-06-07: 13.33 mg via INTRA_ARTICULAR

## 2020-06-07 MED ORDER — BUPIVACAINE HCL 0.25 % IJ SOLN
0.6600 mL | INTRAMUSCULAR | Status: AC | PRN
  Administered 2020-06-07: .66 mL via INTRA_ARTICULAR

## 2020-06-07 NOTE — Progress Notes (Signed)
Office Visit Note   Patient: Max Ward           Date of Birth: Oct 13, 1971           MRN: [id number] Visit Date: 06/07/2020              Requested by: Libby Maw, MD [address] [place],  [place] [address] PCP: Libby Maw, MD   Assessment & Plan: Visit Diagnoses:  1. Chronic right shoulder pain   2. Chronic pain of both knees     Plan: Impression is right shoulder AC arthritis and bilateral knee arthritis flareup.  In regards to his knees, have injected both with cortisone today.  He will follow-up with [place] as needed.  In regards to his right shoulder, I have referred him to Dr. Junius Roads for ultrasound-guided cortisone injection to the [facility] joint.  Follow-Up Instructions: Return if symptoms worsen or fail to improve.   Orders:  Orders Placed This Encounter  Procedures  . Large Joint Inj: bilateral knee  . XR Shoulder Right  . XR KNEE 3 VIEW LEFT  . US Guided Needle Placement - No Linked Charges  . US Guided Needle Placement   No orders of the defined types were placed in this encounter.     Procedures: Large Joint Inj: bilateral knee on 06/07/2020 4:48 PM Indications: pain Details: 22 G needle, anterolateral approach Medications (Right): 0.66 mL bupivacaine 0.25 %; 3 mL lidocaine 1 %; 13.33 mg methylPREDNISolone acetate 40 MG/ML Medications (Left): 0.66 mL bupivacaine 0.25 %; 3 mL lidocaine 1 %; 13.33 mg methylPREDNISolone acetate 40 MG/ML      Clinical Data: No additional findings.   Subjective: Chief Complaint  Patient presents with  . Left Knee - Pain  . Right Shoulder - Pain    HPI patient is a pleasant 49 year old gentleman who comes in today with recurrent right shoulder pain.  This began over a year ago after falling off of a toolbox onto his shoulder.  He was seen in our office where subsequent MRI of the shoulder was obtained.  This showed edema as well as osteoarthritis to the [facility] joint.  He was referred to Dr.  Junius Roads where he had an ultrasound-guided cortisone injection to the [facility] joint.  He notes that this significantly helped but only lasted about 3 to 4 weeks.  He comes in today with recurrent pain primarily to the [facility] joint.  Worse with any movement of the shoulder.  He thinks he is now getting trigger points to his right parascapular region due to the pain and lack of mobility of the right shoulder.  He is also complaining of left knee pain following a twisting injury which occurred about 3 weeks ago.  He has had pain to the retropatella since.  He also has popping sensations at times.  Cold weather as well as pivoting of the knee seems to aggravate his symptoms.  No previous injection to the left knee.  He is having similar symptoms to the right knee but without injury.  These have been ongoing for several years.  No previous injection there.  Review of Systems as detailed in HPI.  All others reviewed and are negative.   Objective: Vital Signs: There were no vitals taken for this visit.  Physical Exam well-developed well-nourished gentleman no acute distress.  Alert and oriented x3.  Ortho Exam examination of the right shoulder reveals moderate tenderness to the [facility] joint.  He also has a tender parascapular  trigger point to the same side.  He has about 50% range of motion secondary to pain.  Bilateral knee exam shows no effusion.  Medial lateral joint line tenderness both sides.  Mild patellofemoral crepitus.  Ligaments are stable.  He is neurovascular intact distally.  Specialty Comments:  No specialty comments available.  Imaging: US Guided Needle Placement  Result Date: 06/07/2020 Moderate degenerative changes to all 3 compartments  US Guided Needle Placement - No Linked Charges  Result Date: 06/07/2020 Ultrasound-guided right AC joint injection: After sterile prep with Betadine, injected 3 cc 1% lidocaine without epinephrine and 20 mg methylprednisolone into the [facility] joint, then redirected the  needle into the tender trigger point and injected 2 cc of lidocaine and 20 mg methylprednisolone into it.  XR KNEE 3 VIEW LEFT  Result Date: 06/07/2020 Moderate degenerative changes to all 3 compartments  XR Shoulder Right  Result Date: 06/07/2020 Moderate degenerative changes the [facility] joint    PMFS History: Patient Active Problem List   Diagnosis Date Noted  . Otitis externa 01/05/2020  . Irritable bowel syndrome with constipation 10/29/2019  . Generalized abdominal pain 10/29/2019  . Hospital discharge follow-up 09/08/2019  . Ganglion cyst of volar aspect of right wrist 09/03/2018  . Moderate persistent asthma 08/05/2018  . PVC (premature ventricular contraction) 07/14/2018  . SOB (shortness of breath) 07/14/2018  . Finger pain, right 06/24/2018  . Pain in right elbow 06/24/2018  . Pain in right wrist 06/24/2018  . Ganglion cyst 12/19/2017  . Finger injury, right, sequela 12/19/2017  . Palpitations 08/29/2017  . Dental caries 06/27/2017  . Chronic pain of right knee 03/13/2017  . Abnormal coagulation profile 07/11/2016  . Dysfunction of both eustachian tubes 07/11/2016  . 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 07/11/2016  . Right ear pain 07/11/2016  . Mixed hyperlipidemia 02/21/2016  . Seasonal allergic rhinitis due to pollen 02/21/2016  . Encounter for therapeutic drug monitoring 02/16/2016  . Common migraine 02/15/2016  . High risk medication use 02/15/2016  . History of traumatic head injury 02/15/2016  . Low back pain 02/15/2016  . Neck pain 02/15/2016  . Status post mechanical aortic valve replacement 11/25/2015  . ADHD (attention deficit hyperactivity disorder) 10/18/2015  . Allergic rhinitis 10/18/2015  . Anemia 10/18/2015  . Asthma 10/18/2015  . Gastroesophageal reflux disease 10/18/2015  . Herniated nucleus pulposus, L4-5 10/18/2015  . Insomnia 10/18/2015  . Pain syndrome, chronic 10/18/2015  . SVT (supraventricular tachycardia) ([place]) 10/18/2015  . Essential  hypertension 05/30/2015  . Long term (current) use of anticoagulants 05/30/2015  . Postural dizziness with presyncope 05/30/2015  . Bipolar affective disorder ([place]) 08/09/2011  . Chest pain 08/09/2011  . Anxiety 08/09/2011  . Depressed affect 08/09/2011  . 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 Tobacco use 08/09/2011   Past Medical History:  Diagnosis Date  . Anal fissure   . Anemia   . Anxiety   . Arthritis   . Asthma   . Bipolar 1 disorder ([place])   . Complication of anesthesia    woke up during colonoscoy  . DDD (degenerative disc disease), cervical   . Depression   . DJD (degenerative joint disease)   . GERD (gastroesophageal reflux disease)   . HLD (hyperlipidemia)   . Hypertension   . SVT (supraventricular tachycardia) (HCC)    s/p prior ablation by Dr Seabron Spates at [facility] for [place] History  Problem Relation Age of Onset  . Lung cancer Mother   .  Emphysema Father   . Diabetes Paternal Grandmother   . Cancer Paternal Uncle   . Brain cancer Maternal Aunt     Past Surgical History:  Procedure Laterality Date  . BONE TUMOR EXCISION     skull  . CARDIAC CATHETERIZATION  2018   [facility] reveals normal cors  . GANGLION CYST EXCISION Right 09/03/2018   Procedure: REMOVAL GANGLION OF RIGHT WRIST AND RIGHT ELBOW;  Surgeon: Leandrew Koyanagi, MD;  Location: [facility];  Service: Orthopedics;  Laterality: Right;  OTHER SITE IS ELBOW  . MECHANICAL AORTIC VALVE REPLACEMENT     Social History   Occupational History  . Occupation: disabled  Tobacco Use  . Smoking status: Current Every Day Smoker    Packs/day: 1.00    Years: 15.00    Pack years: 15.00    Types: Cigarettes  . Smokeless tobacco: Never Used  . Tobacco comment: 1/2 pack  or less per day Jan 2020  Vaping Use  . Vaping Use: Former  Substance and Sexual Activity  . Alcohol use: Yes    Comment: 1-3 beers on occ.   . Drug use: No  . Sexual activity: Not on  file

## 2020-06-07 NOTE — Progress Notes (Signed)
Subjective: He is here for right AC joint injection.  Previous injection helped for a couple weeks but the pain came back.  Objective: He has a tender trigger point at the distal aspect of the trapezius belly posterior to the [facility] joint, and is also tender over the [facility] joint itself.  Procedure: Ultrasound-guided right AC joint injection: After sterile prep with Betadine, injected 3 cc 1% lidocaine without epinephrine and 20 mg methylprednisolone into the [facility] joint, then redirected the needle into the tender trigger point and injected 2 cc of lidocaine and 20 mg methylprednisolone into it.

## 2020-06-21 ENCOUNTER — Ambulatory Visit: Payer: Medicare Other | Admitting: Orthopaedic Surgery

## 2020-06-22 ENCOUNTER — Ambulatory Visit (INDEPENDENT_AMBULATORY_CARE_PROVIDER_SITE_OTHER): Payer: Medicare Other | Admitting: Cardiovascular Disease

## 2020-06-22 DIAGNOSIS — Z952 Presence of prosthetic heart valve: Secondary | ICD-10-CM | POA: Diagnosis not present

## 2020-06-22 DIAGNOSIS — Z7901 Long term (current) use of anticoagulants: Secondary | ICD-10-CM | POA: Diagnosis not present

## 2020-06-22 LAB — POCT INR: INR: 2.6 (ref 2.0–3.0)

## 2020-07-13 ENCOUNTER — Ambulatory Visit (INDEPENDENT_AMBULATORY_CARE_PROVIDER_SITE_OTHER): Payer: Medicare Other | Admitting: Cardiovascular Disease

## 2020-07-13 DIAGNOSIS — Z952 Presence of prosthetic heart valve: Secondary | ICD-10-CM

## 2020-07-13 DIAGNOSIS — Z7901 Long term (current) use of anticoagulants: Secondary | ICD-10-CM

## 2020-07-13 LAB — POCT INR: INR: 2.6 (ref 2.0–3.0)

## 2020-08-01 ENCOUNTER — Telehealth: Payer: Self-pay

## 2020-08-01 NOTE — Telephone Encounter (Signed)
Called and lmomed the pt that they need to recheck their inr

## 2020-08-04 ENCOUNTER — Other Ambulatory Visit: Payer: Self-pay | Admitting: Internal Medicine

## 2020-08-09 LAB — POCT INR: INR: 2.6 (ref 2.0–3.0)

## 2020-08-10 ENCOUNTER — Ambulatory Visit (INDEPENDENT_AMBULATORY_CARE_PROVIDER_SITE_OTHER): Payer: Medicare Other | Admitting: Cardiovascular Disease

## 2020-08-10 DIAGNOSIS — Z7901 Long term (current) use of anticoagulants: Secondary | ICD-10-CM

## 2020-08-10 DIAGNOSIS — Z952 Presence of prosthetic heart valve: Secondary | ICD-10-CM | POA: Diagnosis not present

## 2020-08-31 ENCOUNTER — Telehealth: Payer: Self-pay

## 2020-08-31 NOTE — Telephone Encounter (Signed)
lmom for overdue inr 

## 2020-09-04 ENCOUNTER — Other Ambulatory Visit: Payer: Self-pay | Admitting: Family Medicine

## 2020-09-04 ENCOUNTER — Other Ambulatory Visit: Payer: Self-pay | Admitting: Gastroenterology

## 2020-09-05 ENCOUNTER — Telehealth: Payer: Self-pay

## 2020-09-05 NOTE — Telephone Encounter (Signed)
Called and lmom pt overdue inr

## 2020-09-06 ENCOUNTER — Ambulatory Visit (INDEPENDENT_AMBULATORY_CARE_PROVIDER_SITE_OTHER): Payer: Medicare Other | Admitting: Cardiovascular Disease

## 2020-09-06 DIAGNOSIS — Z7901 Long term (current) use of anticoagulants: Secondary | ICD-10-CM

## 2020-09-06 DIAGNOSIS — Z952 Presence of prosthetic heart valve: Secondary | ICD-10-CM | POA: Diagnosis not present

## 2020-09-06 LAB — POCT INR: INR: 1.9 — AB (ref 2.0–3.0)

## 2020-09-22 ENCOUNTER — Telehealth: Payer: Self-pay

## 2020-09-22 NOTE — Telephone Encounter (Signed)
lmom pt for overdue inr 

## 2020-09-26 ENCOUNTER — Telehealth: Payer: Self-pay | Admitting: Family Medicine

## 2020-09-26 ENCOUNTER — Ambulatory Visit (INDEPENDENT_AMBULATORY_CARE_PROVIDER_SITE_OTHER): Payer: Medicare Other | Admitting: Cardiology

## 2020-09-26 DIAGNOSIS — Z7901 Long term (current) use of anticoagulants: Secondary | ICD-10-CM

## 2020-09-26 DIAGNOSIS — Z952 Presence of prosthetic heart valve: Secondary | ICD-10-CM

## 2020-09-26 DIAGNOSIS — Z5181 Encounter for therapeutic drug level monitoring: Secondary | ICD-10-CM

## 2020-09-26 LAB — POCT INR: INR: 3.3 — AB (ref 2.0–3.0)

## 2020-09-26 NOTE — Telephone Encounter (Signed)
Left message for patient to schedule Annual Wellness Visit.  Please schedule with Nurse Health Advisor Martha Stanley, RN at [facility] [place]  °

## 2020-09-28 ENCOUNTER — Other Ambulatory Visit: Payer: Self-pay | Admitting: Internal Medicine

## 2020-10-05 ENCOUNTER — Other Ambulatory Visit: Payer: Self-pay | Admitting: Pulmonary Disease

## 2020-10-11 ENCOUNTER — Telehealth: Payer: Self-pay

## 2020-10-11 NOTE — Telephone Encounter (Signed)
Called and spoke w/pt regarding overdue inr and they stated that they will check it asap

## 2020-10-13 ENCOUNTER — Ambulatory Visit (INDEPENDENT_AMBULATORY_CARE_PROVIDER_SITE_OTHER): Payer: Medicare Other | Admitting: Pharmacist Clinician (PhC)/ Clinical Pharmacy Specialist

## 2020-10-13 DIAGNOSIS — Z952 Presence of prosthetic heart valve: Secondary | ICD-10-CM | POA: Diagnosis not present

## 2020-10-13 DIAGNOSIS — Z7901 Long term (current) use of anticoagulants: Secondary | ICD-10-CM

## 2020-10-13 LAB — POCT INR: INR: 1.4 — AB (ref 2.0–3.0)

## 2020-11-07 ENCOUNTER — Other Ambulatory Visit: Payer: Self-pay | Admitting: Family Medicine

## 2020-11-07 DIAGNOSIS — K219 Gastro-esophageal reflux disease without esophagitis: Secondary | ICD-10-CM

## 2020-11-08 ENCOUNTER — Ambulatory Visit (INDEPENDENT_AMBULATORY_CARE_PROVIDER_SITE_OTHER): Payer: Medicare Other | Admitting: Pharmacist

## 2020-11-08 DIAGNOSIS — Z7901 Long term (current) use of anticoagulants: Secondary | ICD-10-CM

## 2020-11-08 DIAGNOSIS — Z5181 Encounter for therapeutic drug level monitoring: Secondary | ICD-10-CM

## 2020-11-08 LAB — POCT INR: INR: 2.4 (ref 2.0–3.0)

## 2020-12-06 ENCOUNTER — Ambulatory Visit (INDEPENDENT_AMBULATORY_CARE_PROVIDER_SITE_OTHER): Payer: Medicare Other | Admitting: Cardiovascular Disease

## 2020-12-06 DIAGNOSIS — Z952 Presence of prosthetic heart valve: Secondary | ICD-10-CM

## 2020-12-06 DIAGNOSIS — Z7901 Long term (current) use of anticoagulants: Secondary | ICD-10-CM

## 2020-12-06 LAB — POCT INR: INR: 4.3 — AB (ref 2.0–3.0)

## 2020-12-19 ENCOUNTER — Telehealth: Payer: Self-pay

## 2020-12-19 NOTE — Telephone Encounter (Signed)
Overdue inr check

## 2020-12-20 ENCOUNTER — Ambulatory Visit (INDEPENDENT_AMBULATORY_CARE_PROVIDER_SITE_OTHER): Payer: Medicare Other | Admitting: Internal Medicine

## 2020-12-20 DIAGNOSIS — Z952 Presence of prosthetic heart valve: Secondary | ICD-10-CM | POA: Diagnosis not present

## 2020-12-20 DIAGNOSIS — Z7901 Long term (current) use of anticoagulants: Secondary | ICD-10-CM | POA: Diagnosis not present

## 2020-12-20 LAB — POCT INR: INR: 1.9 — AB (ref 2.0–3.0)

## 2021-01-04 ENCOUNTER — Telehealth: Payer: Self-pay

## 2021-01-04 NOTE — Telephone Encounter (Signed)
lmom for overdue inr 

## 2021-01-05 ENCOUNTER — Ambulatory Visit (INDEPENDENT_AMBULATORY_CARE_PROVIDER_SITE_OTHER): Payer: Medicare Other | Admitting: Cardiovascular Disease

## 2021-01-05 DIAGNOSIS — Z952 Presence of prosthetic heart valve: Secondary | ICD-10-CM | POA: Diagnosis not present

## 2021-01-05 DIAGNOSIS — Z7901 Long term (current) use of anticoagulants: Secondary | ICD-10-CM

## 2021-01-05 LAB — POCT INR: INR: 4.7 — AB (ref 2.0–3.0)

## 2021-01-17 ENCOUNTER — Ambulatory Visit (INDEPENDENT_AMBULATORY_CARE_PROVIDER_SITE_OTHER): Payer: Medicare Other | Admitting: Cardiovascular Disease

## 2021-01-17 DIAGNOSIS — Z952 Presence of prosthetic heart valve: Secondary | ICD-10-CM

## 2021-01-17 DIAGNOSIS — Z7901 Long term (current) use of anticoagulants: Secondary | ICD-10-CM

## 2021-01-17 LAB — POCT INR: INR: 2.4 (ref 2.0–3.0)

## 2021-01-18 ENCOUNTER — Telehealth: Payer: Self-pay

## 2021-01-18 NOTE — Telephone Encounter (Signed)
01-18-2021  Patient was contacted to come in for an over due (Nov 2020) follow up with Dr Margaretann Loveless.  He stated that he was not happy with the care he received.  He also has a cardiologist he sees in [facility].  At this point, he is undecided if he wants to continue with treatment at [facility].  Ask that we call him back in a couple of months for his decision.

## 2021-01-31 ENCOUNTER — Other Ambulatory Visit: Payer: Self-pay | Admitting: Gastroenterology

## 2021-01-31 ENCOUNTER — Ambulatory Visit (INDEPENDENT_AMBULATORY_CARE_PROVIDER_SITE_OTHER): Payer: Medicare Other | Admitting: Gastroenterology

## 2021-01-31 ENCOUNTER — Encounter: Payer: Self-pay | Admitting: Gastroenterology

## 2021-01-31 VITALS — BP 130/80 | HR 74 | Ht 68.5 in | Wt 224.0 lb

## 2021-01-31 DIAGNOSIS — Z7901 Long term (current) use of anticoagulants: Secondary | ICD-10-CM | POA: Diagnosis not present

## 2021-01-31 DIAGNOSIS — K219 Gastro-esophageal reflux disease without esophagitis: Secondary | ICD-10-CM

## 2021-01-31 DIAGNOSIS — K5909 Other constipation: Secondary | ICD-10-CM

## 2021-01-31 MED ORDER — OMEPRAZOLE 40 MG PO CPDR: 1.0000 | DELAYED_RELEASE_CAPSULE | Freq: Two times a day (BID) | ORAL | 2 refills | Status: DC

## 2021-01-31 MED ORDER — DICYCLOMINE HCL 10 MG PO CAPS
10.0000 mg | ORAL_CAPSULE | Freq: Two times a day (BID) | ORAL | 2 refills | Status: DC
Start: 2021-01-31 — End: 2021-08-07

## 2021-01-31 MED ORDER — PLENVU 140 G PO SOLR: 140.0000 g | ORAL | 0 refills | Status: DC

## 2021-01-31 MED ORDER — LUBIPROSTONE 24 MCG PO CAPS: 24.0000 ug | ORAL_CAPSULE | Freq: Two times a day (BID) | ORAL | 3 refills | Status: DC

## 2021-01-31 NOTE — Progress Notes (Signed)
01/31/2021 B and E [id number] 31-May-1971   HISTORY OF PRESENT ILLNESS:  This is a 50 year old male who was seen by me in 10/2019 and his care was established with Dr. [name].  He was here with complaints of ongoing constipation and GERD.  We had recommended an EGD and colonoscopy, but wanted cardiac clearance first due to some cardiac issues that he was having.  He never returned or proceeded with those.  I had prescribed dicyclomine 10 mg twice daily and omeprazole 40 mg twice daily.  He tells me that he has been using the omeprazole 40 mg daily and it does okay.  He tells me Nexium worked better for him in the past, but insurance will not cover it.  He is using the dicyclomine 10 mg twice daily and would like refills on that.  He continues to complain of constipation.  He says that he gets so full from the constipation that he cannot eat a full meal.  He has history of mechanical AVR and is on chronic coumadin.  He reports longstanding issues with anxiety/panic attacks.  He says that he has had issues with his stomach since he was a child.  He says that he has problems with his memory.  He believes that he has had at least one colonoscopy in the past, but we do not have records and he does not recall as to where, when, or what the findings were.      Past Medical History:  Diagnosis Date  . Anal fissure   . Anemia   . Anxiety   . Arthritis   . Asthma   . Bipolar 1 disorder ([place])   . Complication of anesthesia    woke up during colonoscoy  . DDD (degenerative disc disease), cervical   . Depression   . DJD (degenerative joint disease)   . GERD (gastroesophageal reflux disease)   . HLD (hyperlipidemia)   . Hypertension   . SVT (supraventricular tachycardia) (HCC)    s/p prior ablation by Dr Seabron Spates at [facility] for [place]   Past Surgical History:  Procedure Laterality Date  . BONE TUMOR EXCISION     skull  . CARDIAC CATHETERIZATION  2018   [facility] reveals normal cors  . GANGLION  CYST EXCISION Right 09/03/2018   Procedure: REMOVAL GANGLION OF RIGHT WRIST AND RIGHT ELBOW;  Surgeon: Leandrew Koyanagi, MD;  Location: [place];  Service: Orthopedics;  Laterality: Right;  OTHER SITE IS ELBOW  . MECHANICAL AORTIC VALVE REPLACEMENT      reports that he has been smoking cigarettes. He has a 15.00 pack-year smoking history. He has never used smokeless tobacco. He reports current alcohol use. He reports that he does not use drugs. family history includes Brain cancer in his maternal aunt; COPD in his father; Cancer in his paternal uncle; Dementia in his father; Diabetes in his paternal grandmother; Emphysema in his father; Lung cancer in his mother. Allergies  Allergen Reactions  . Molds & Smuts Other (See Comments)    Unknown  . Valium [Diazepam] Palpitations  . Vicodin [Hydrocodone-Acetaminophen] Palpitations      Outpatient Encounter Medications as of 01/31/2021  Medication Sig  . ADVAIR DISKUS 250-50 MCG/DOSE AEPB INHALE 1 PUFF BY MOUTH TWICE A DAY  . albuterol (VENTOLIN HFA) 108 (90 Base) MCG/ACT inhaler INHALE 1-2 PUFFS BY MOUTH EVERY 4-6 HOURS AS NEEDED AND AS DIRECTED  . alprazolam (XANAX) 2 MG tablet Take 2 mg by mouth 4 (  four) times daily as needed for sleep.  Marland Kitchen amphetamine-dextroamphetamine (ADDERALL XR) 25 MG 24 hr capsule Take 1 capsule by mouth 2 (two) times daily.  . Asenapine Maleate 10 MG SUBL Place 30 mg under the tongue at bedtime.  Marland Kitchen azelastine (ASTELIN) 0.1 % nasal spray Place 1 spray into both nostrils 2 (two) times daily. Use in each nostril as directed  . cetirizine (ZYRTEC) 10 MG tablet Take 1 tablet (10 mg total) by mouth at bedtime. Additional refills from pcp: Dr.Kremer  . dicyclomine (BENTYL) 10 MG capsule TAKE 1 CAPSULE (10 MG TOTAL) BY MOUTH 2 (TWO) TIMES DAILY.  . fluticasone (FLONASE) 50 MCG/ACT nasal spray Place 1 spray into both nostrils daily.  . hydrOXYzine (VISTARIL) 50 MG capsule Take 1 capsule by mouth as needed for anxiety.   . metoprolol succinate (TOPROL-XL) 50 MG 24 hr tablet Take 50 mg by mouth daily. Take with or immediately following a meal.  . montelukast (SINGULAIR) 10 MG tablet TAKE 1 TABLET BY MOUTH EVERY DAY  . neomycin-polymyxin-hydrocortisone (CORTISPORIN) 3.5-10000-1 OTIC suspension Place 4 drops into the right ear 4 (four) times daily.  . traZODone (DESYREL) 100 MG tablet Take 300 mg by mouth at bedtime.  Marland Kitchen warfarin (COUMADIN) 5 MG tablet TAKE 1 TO 1 AND 1/2 TABLET DAILY AS DIRECTED BY COUMADIN CLINIC  . esomeprazole (NEXIUM) 40 MG capsule Take 1 capsule (40 mg total) by mouth daily at 12 noon. (Patient not taking: Reported on 01/31/2021)  . omeprazole (PRILOSEC) 40 MG capsule TAKE 1 CAPSULE BY MOUTH TWICE A DAY  . [DISCONTINUED] flecainide (TAMBOCOR) 50 MG tablet TAKE 1 TABLET BY MOUTH TWICE A DAY (Patient not taking: Reported on 01/14/2020)  . [DISCONTINUED] predniSONE (DELTASONE) 10 MG tablet Take 2 twice daily for 7 days and then one twice daily for 4 days and stop.  . [DISCONTINUED] ranolazine (RANEXA) 500 MG 12 hr tablet Take 1 tablet by mouth 2 (two) times daily.  . [DISCONTINUED] traMADol (ULTRAM) 50 MG tablet TAKE 1 TABLET TWICE A DAY AS NEEDED FOR PAIN   No facility-administered encounter medications on file as of 01/31/2021.     REVIEW OF SYSTEMS  : All other systems reviewed and negative except where noted in the History of Present Illness.   PHYSICAL EXAM: BP 130/80   Pulse 74   Ht 5' 8.5" (1.74 m)   Wt 224 lb (101.6 kg)   BMI 33.56 kg/m  General: Well developed white male in no acute distress Head: Normocephalic and atraumatic Eyes:  Sclerae anicteric, conjunctiva pink. Ears: Normal auditory acuity Lungs: Clear throughout to auscultation; no W/R/R. Heart: Regular rate and rhythm; mechanical valve click noted. Abdomen: Soft, non-distended.  BS present.  Mild diffuse TTP. Rectal:  Will be done at the time of colonoscopy. Musculoskeletal: Symmetrical with no gross deformities   Skin: No lesions on visible extremities Extremities: No edema  Neurological: Alert oriented x 4, grossly non-focal Psychological:  Alert and cooperative. Normal mood and affect  ASSESSMENT AND PLAN: *Suspected IBS with constipation: Patient has a lot of issues with longstanding anxiety/panic attacks.  Longstanding issues with his "stomach" as well.  He reports at least 1 colonoscopy in the past, if not more, however, we do not have those records and he does not remember details in regards to those.  We had planned for colonoscopy in early 2021, but we wanted cardiology clearance first due to some cardiac issues that he was having.  He never had colonoscopy performed.  He is still complaining  of constipation.  We had previously recommended MiraLAX and he was given dicyclomine to use a couple of times a day.  He does use the dicyclomine and would like refills on that.  In regards to constipation, we will try Amitiza 24 mcg twice daily with meals.  Prescription sent to pharmacy for that as well.  Will plan for colonoscopy with Dr. [name] at this time (2 day bowel prep).   *GERD:  He is on omeprazole 40 mg twice daily and is doing ok with that.  We previously planned for EGD, and but he never followed through with that.  I will refill his prescription.  We will plan for EGD in the near future with his colonoscopy. *Mechanical aortic valve replacement requiring chronic anticoagulation with Coumadin:  Will likely need Lovenox bridging so we will touch base with his cardiologist, Dr. Elonda Husky.  CC:  Libby Maw,*

## 2021-01-31 NOTE — Telephone Encounter (Signed)
Please advise as this isnt covered.

## 2021-01-31 NOTE — Patient Instructions (Signed)
If you are age 50 or older, your body mass index should be between 23-30. Your Body mass index is 33.56 kg/m. If this is out of the aforementioned range listed, please consider follow up with your Primary Care Provider.  If you are age 79 or younger, your body mass index should be between 19-25. Your Body mass index is 33.56 kg/m. If this is out of the aformentioned range listed, please consider follow up with your Primary Care Provider.   You have been scheduled for an endoscopy and colonoscopy. Please follow the written instructions given to you at your visit today. Please pick up your prep supplies at the pharmacy within the next 1-3 days. If you use inhalers (even only as needed), please bring them with you on the day of your procedure.  It was a pleasure to see you today!  Dr. [name]

## 2021-02-01 ENCOUNTER — Telehealth: Payer: Self-pay

## 2021-02-01 NOTE — Telephone Encounter (Signed)
lmom for Warfarin overdue self tester

## 2021-02-03 ENCOUNTER — Encounter: Payer: Self-pay | Admitting: Gastroenterology

## 2021-02-03 DIAGNOSIS — K5909 Other constipation: Secondary | ICD-10-CM | POA: Insufficient documentation

## 2021-02-06 ENCOUNTER — Ambulatory Visit (INDEPENDENT_AMBULATORY_CARE_PROVIDER_SITE_OTHER): Payer: Medicare Other | Admitting: Cardiology

## 2021-02-06 DIAGNOSIS — Z952 Presence of prosthetic heart valve: Secondary | ICD-10-CM

## 2021-02-06 DIAGNOSIS — Z7901 Long term (current) use of anticoagulants: Secondary | ICD-10-CM | POA: Diagnosis not present

## 2021-02-06 LAB — POCT INR: INR: 3 (ref 2.0–3.0)

## 2021-02-06 NOTE — Progress Notes (Signed)
____________________________________________________________  Attending physician addendum:  Thank you for sending this case to me. I have reviewed the entire note and agree with the plan.  I reviewed the most recent [place] cardiology clinic note from January of this year. He will definitely need bridging Lovenox therapy because he has a mechanical AVR.  His cardiologist and the anticoagulation clinic will need to manage this for him, but we certainly need to know what those instructions will be.  We need to be sure he has a morning procedure slot arranged because his last Lovenox dose will most likely be the late afternoon or evening prior.  Wilfrid Lund, MD  ____________________________________________________________

## 2021-02-08 ENCOUNTER — Telehealth: Payer: Self-pay

## 2021-02-08 NOTE — Telephone Encounter (Signed)
Unusual case. It appears patient was last seen by Dr. Margaretann Loveless in May 2020 (long overdue).  Since then, he has been followed by cardiologist at the [facility].  However his Coumadin is still being managed by our Coumadin clinic.  Last seen on 02/06/2021.  Recommended next INR check is on 02/20/2021, however I do not see any appointment made.  He has a mechanical aortic valve replacement history.  Will defer to our clinical pharmacist and his Coumadin clinic to arrange Lovenox bridging prior to surgery.  GI procedure scheduled for 03/22/2021.

## 2021-02-08 NOTE — Telephone Encounter (Signed)
Will await communication from the pre-op pool for Lovonox bridge information

## 2021-02-08 NOTE — Telephone Encounter (Signed)
Per patients cardiologist he is cleared to have the procedure but requires a Lovonox bridge. This is to be done by the anti-coag clinic.   [facility] Pre-operative Risk Assessment     Request for surgical clearance:     Endoscopy Procedure  What type of surgery is being performed? EGD/Colon    When is this surgery scheduled?     03-22-2021  What type of clearance is required ?   Pharmacy  Are there any medications that need to be held prior to surgery and how long? Yes, needs a Lovonox bridge  Practice name and name of physician performing surgery?      [facility]  What is your office phone and fax number?      Phone- [phone]  [phone]  Anesthesia type (None, local, MAC, general) ?       MAC

## 2021-02-08 NOTE — Telephone Encounter (Signed)
-----   Message from [place], MD sent at 02/07/2021  4:19 PM EDT ----- That is not how we do things. I have gotten that response before from some [place] cardiologists. In a patient with a mechanical valve, this must be managed by the cardiologist or the anti-coagulation clinic.  It seems to me that the pharmacist at anti-coag clinic often manages this after communicating with the patient's cardiologist.  It looks to me like this patient has a [place] cardiologist but sees the [facility] anti-coag clinic. Please try to identify an individual with the [facility] anti-coag clinic (perhaps by checking recent notes from them) and go that route.  Thanks.  - HD ----- Message ----- From: Loralie Champagne, PA-C Sent: 02/07/2021  10:50 AM EDT To: Elias Else, CMA, Doran Stabler, MD  Patient's cardiologist kicked back and said that Lovenox needed to be prescribed by the "surgeon".  Should we message them back about it and let them know that we do not usually handle that or do you want [place] to see if his PCP is willing to prescribe it?

## 2021-02-08 NOTE — Telephone Encounter (Signed)
HeartCare should only be monitoring Coumadin for patients who are established with and still following with a HeartCare provider. He was last seen by Dr Margaretann Loveless on 03/11/19 and Dr Rayann Heman on 06/10/19. If pt is no longer seeing one of our cardiologists and is established at [facility] now, they should be managing his INRs and any Lovenox bridges needed. 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 He also would not require Lovenox bridging per our protocol as he has a mechanical AVR without other risk factors (such as prior stroke, afib, or CHF).

## 2021-02-09 NOTE — Telephone Encounter (Signed)
Please see additional communications. Pt will re-establish with Dr Margaretann Loveless and will then be set up with Lovenox bridging.

## 2021-02-09 NOTE — Telephone Encounter (Signed)
That sounds great!! Thank you so much for taking care of this.

## 2021-02-09 NOTE — Telephone Encounter (Signed)
Called and spoke w/pt and stated that he needed to see one of the [facility] providers to continue getting coumadin checks with [facility]. The pt proceeded to cuss me out  And stated that we have always checked his coumadin and proceeded to hang up the phone. I will route this conversation to the pharmd pool to make them aware

## 2021-02-09 NOTE — Telephone Encounter (Signed)
Case reviewed as part of preoperative cardiac evaluation/protocol.  Patient has appointment with Dr.Acharya 02/27/2021.  Patient's anticoagulation is managed by patient's anticoagulation is managed by our Coumadin clinic.  I will defer clearance to 02/27/2021 appointment and bridging to Coumadin clinic.  I will remove from preoperative pool.  Thank you for your help.  Jossie Ng. Mica Ramdass NP-C    02/09/2021, 11:37 AM [address] [place] Office [phone] Fax [phone]

## 2021-02-09 NOTE — Telephone Encounter (Signed)
Called and spoke w/pcp to try to get the pt set up with a coumadin clinic there and they stated that they will reach out to the pt's pcp doctor and they will call the pt

## 2021-02-09 NOTE — Telephone Encounter (Signed)
Disregard note on nexlizet - wrong chart

## 2021-02-09 NOTE — Telephone Encounter (Signed)
Patient will just need to have PA altered to cover Nexlizet instead of Nexletol.  We can do this when he is closer to needing the combination product.

## 2021-02-09 NOTE — Telephone Encounter (Signed)
Patient preference is to follow up with our Coumadin clinic. Cardiologist at [place] will not provide service.  Follow up appointment schedule with Dr Margaretann Loveless for 02/27/2021.

## 2021-02-14 ENCOUNTER — Telehealth (HOSPITAL_COMMUNITY): Payer: Self-pay | Admitting: Vascular Surgery

## 2021-02-14 NOTE — Telephone Encounter (Signed)
Spoke to [employer] , PT is not an appropriate candidate for the [facility]

## 2021-02-20 ENCOUNTER — Other Ambulatory Visit (INDEPENDENT_AMBULATORY_CARE_PROVIDER_SITE_OTHER): Payer: Medicare Other

## 2021-02-20 DIAGNOSIS — Z7901 Long term (current) use of anticoagulants: Secondary | ICD-10-CM

## 2021-02-27 ENCOUNTER — Ambulatory Visit (INDEPENDENT_AMBULATORY_CARE_PROVIDER_SITE_OTHER): Payer: Medicare Other

## 2021-02-27 ENCOUNTER — Other Ambulatory Visit: Payer: Self-pay

## 2021-02-27 ENCOUNTER — Encounter: Payer: Self-pay | Admitting: Internal Medicine

## 2021-02-27 ENCOUNTER — Ambulatory Visit (INDEPENDENT_AMBULATORY_CARE_PROVIDER_SITE_OTHER): Payer: Medicare Other | Admitting: Internal Medicine

## 2021-02-27 VITALS — BP 126/84 | HR 71 | Ht 69.0 in | Wt 224.6 lb

## 2021-02-27 DIAGNOSIS — E782 Mixed hyperlipidemia: Secondary | ICD-10-CM

## 2021-02-27 DIAGNOSIS — I493 Ventricular premature depolarization: Secondary | ICD-10-CM

## 2021-02-27 DIAGNOSIS — I10 Essential (primary) hypertension: Secondary | ICD-10-CM

## 2021-02-27 DIAGNOSIS — R002 Palpitations: Secondary | ICD-10-CM

## 2021-02-27 DIAGNOSIS — I77810 Thoracic aortic ectasia: Secondary | ICD-10-CM

## 2021-02-27 DIAGNOSIS — Z7901 Long term (current) use of anticoagulants: Secondary | ICD-10-CM

## 2021-02-27 DIAGNOSIS — Z952 Presence of prosthetic heart valve: Secondary | ICD-10-CM

## 2021-02-27 DIAGNOSIS — Q231 Congenital insufficiency of aortic valve: Secondary | ICD-10-CM

## 2021-02-27 DIAGNOSIS — Z5181 Encounter for therapeutic drug level monitoring: Secondary | ICD-10-CM

## 2021-02-27 DIAGNOSIS — I471 Supraventricular tachycardia: Secondary | ICD-10-CM

## 2021-02-27 LAB — POCT INR: INR: 3.5 — AB (ref 2.0–3.0)

## 2021-02-27 MED ORDER — ENOXAPARIN SODIUM 100 MG/ML IJ SOSY: 100.0000 mg | PREFILLED_SYRINGE | Freq: Two times a day (BID) | INTRAMUSCULAR | 1 refills | Status: DC

## 2021-02-27 NOTE — Telephone Encounter (Signed)
Patient had a follow up with cardiology today. Please see office notes and suggestions for holding his coumadin. Thank you   [facility] Pre-operative Risk Assessment     Request for surgical clearance:     Endoscopy Procedure  What type of surgery is being performed?     EGD/Colon   When is this surgery scheduled?     03-22-2021  What type of clearance is required ?   Pharmacy  Are there any medications that need to be held prior to surgery and how long? Yes, coumadin 5 days   Practice name and name of physician performing surgery?      [facility]  What is your office phone and fax number?      Phone- [phone]  [phone]  Anesthesia type (None, local, MAC, general) ?       MAC

## 2021-02-27 NOTE — Patient Instructions (Signed)
Medication Instructions:  No Changes In Medications at this time.  *If you need a refill on your cardiac medications before your next appointment, please call your pharmacy*  Testing/Procedures: Your physician has requested that you have an echocardiogram. Echocardiography is a painless test that uses sound waves to create images of your heart. It provides your doctor with information about the size and shape of your heart and how well your heart's chambers and valves are working. You may receive an ultrasound enhancing agent through an IV if needed to better visualize your heart during the echo.This procedure takes approximately one hour. There are no restrictions for this procedure. This will take place at the [address].   Follow-Up: At [facility], you and your health needs are our priority.  As part of our continuing mission to provide you with exceptional heart care, we have created designated Provider Care Teams.  These Care Teams include your primary Cardiologist (physician) and Advanced Practice Providers (APPs -  Physician Assistants and Nurse Practitioners) who all work together to provide you with the care you need, when you need it.  We recommend signing up for the patient portal called "MyChart".  Sign up information is provided on this After Visit Summary.  MyChart is used to connect with patients for Virtual Visits (Telemedicine).  Patients are able to view lab/test results, encounter notes, upcoming appointments, etc.  Non-urgent messages can be sent to your provider as well.   To learn more about what you can do with MyChart, go to [email or web address].    Your next appointment:   1 year(s)  The format for your next appointment:   In Person  Provider:   Cherlynn Kaiser, MD
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
## 2021-02-27 NOTE — Patient Instructions (Signed)
Hold tonight only and then continue taking 5mg  daily. Recheck INR in 3 weeks - self tester. Hold 6/5, 6/6 and 6/7.  Continue on 6/8

## 2021-03-01 ENCOUNTER — Other Ambulatory Visit: Payer: Self-pay | Admitting: Internal Medicine

## 2021-03-02 NOTE — Telephone Encounter (Signed)
The note indicates that bridging Lovenox will not be necessary.  However, the last part of the note indicates that final recommendations regarding the Coumadin hold time will be given by the anticoagulation clinic after the patient has an echocardiogram that is scheduled for 03/03/2021.  Please be on the look out for final recommendations by cardiology next week.

## 2021-03-02 NOTE — Telephone Encounter (Signed)
Please see cardiology office visit from 02-27-2021 for cardiac clearance. Do we still hold for the 5 days.

## 2021-03-03 ENCOUNTER — Ambulatory Visit (HOSPITAL_COMMUNITY): Payer: Medicare Other | Attending: Internal Medicine

## 2021-03-03 ENCOUNTER — Other Ambulatory Visit: Payer: Self-pay

## 2021-03-03 DIAGNOSIS — Z952 Presence of prosthetic heart valve: Secondary | ICD-10-CM | POA: Insufficient documentation

## 2021-03-03 LAB — ECHOCARDIOGRAM COMPLETE
AR max vel: 1.5 cm2
AV Area VTI: 1.53 cm2
AV Area mean vel: 1.54 cm2
AV Mean grad: 9.8 mmHg
AV Peak grad: 18.5 mmHg
Ao pk vel: 2.15 m/s
Area-P 1/2: 3.53 cm2
S' Lateral: 4 cm

## 2021-03-06 NOTE — Telephone Encounter (Signed)
   Primary Cardiologist: Elouise Munroe, MD  Chart reviewed as part of pre-operative protocol coverage.   Patient was seen by Dr. Margaretann Loveless on 5/16.  Pt had f/u echocardiogram performed and this was reviewed by Dr. Margaretann Loveless.    Here are her comments/recommendations: "AV prosthesis looks stable. No indication for bridging anticoagulation. Coumadin clinic pharmacist cc'ed, please contact patient regarding warfarin hold dates. Would resume warfarin as soon as able from post procedural standpoint. The patient is intermediate risk for low risk procedure. No further cardiovascular testing is required prior to the procedure. If this level of risk is acceptable to the patient and procedural team, the patient should be considered optimized from a cardiovascular standpoint for colonoscopy/endoscopy. Patient understands risk categorization and is willing to proceed. "  Chart review indicates Coumadin Clinic already gave pt instructions on when to hold Coumadin (see anticoag note from 02/27/21).  Please call with questions. Richardson Dopp, PA-C 03/06/2021, 9:50 AM

## 2021-03-07 NOTE — Telephone Encounter (Signed)
Per talking to the patient, he states he was instructed to hold the coumadin starting on 03-19-2021. He had his echo 03-03-2021. Awaiting direct clearance communications

## 2021-03-07 NOTE — Addendum Note (Signed)
Addended by: Rexanne Mano B on: 03/07/2021 01:23 PM   Modules accepted: Orders

## 2021-03-14 NOTE — Telephone Encounter (Signed)
See 02-27-2021 notes from the coumadin clinic.

## 2021-03-21 ENCOUNTER — Telehealth: Payer: Self-pay

## 2021-03-21 NOTE — Telephone Encounter (Signed)
LMOM FOR OVERDUE WARFARIN TEST

## 2021-03-22 ENCOUNTER — Ambulatory Visit (AMBULATORY_SURGERY_CENTER): Payer: Medicare Other | Admitting: Gastroenterology

## 2021-03-22 ENCOUNTER — Encounter: Payer: Self-pay | Admitting: Gastroenterology

## 2021-03-22 ENCOUNTER — Other Ambulatory Visit: Payer: Self-pay

## 2021-03-22 VITALS — BP 121/84 | HR 68 | Temp 97.1°F | Resp 15 | Ht 68.5 in | Wt 224.0 lb

## 2021-03-22 DIAGNOSIS — R1030 Lower abdominal pain, unspecified: Secondary | ICD-10-CM | POA: Diagnosis not present

## 2021-03-22 DIAGNOSIS — K219 Gastro-esophageal reflux disease without esophagitis: Secondary | ICD-10-CM | POA: Diagnosis not present

## 2021-03-22 DIAGNOSIS — D124 Benign neoplasm of descending colon: Secondary | ICD-10-CM

## 2021-03-22 DIAGNOSIS — K5909 Other constipation: Secondary | ICD-10-CM

## 2021-03-22 MED ORDER — SODIUM CHLORIDE 0.9 % IV SOLN: 500.0000 mL | Freq: Once | INTRAVENOUS | Status: DC

## 2021-03-22 NOTE — Patient Instructions (Signed)
Please read handouts provided. Continue present medications. Resume Coumadin ( warfarin ) at prior dose today. Continue Linzess for relief of constipation. Await pathology results. Follow an anti-reflux regimen indefinitely.   YOU HAD AN ENDOSCOPIC PROCEDURE TODAY AT [facility]:   Refer to the procedure report that was given to you for any specific questions about what was found during the examination.  If the procedure report does not answer your questions, please call your gastroenterologist to clarify.  If you requested that your care partner not be given the details of your procedure findings, then the procedure report has been included in a sealed envelope for you to review at your convenience later.  YOU SHOULD EXPECT: Some feelings of bloating in the abdomen. Passage of more gas than usual.  Walking can help get rid of the air that was put into your GI tract during the procedure and reduce the bloating. If you had a lower endoscopy (such as a colonoscopy or flexible sigmoidoscopy) you may notice spotting of blood in your stool or on the toilet paper. If you underwent a bowel prep for your procedure, you may not have a normal bowel movement for a few days.  Please Note:  You might notice some irritation and congestion in your nose or some drainage.  This is from the oxygen used during your procedure.  There is no need for concern and it should clear up in a day or so.  SYMPTOMS TO REPORT IMMEDIATELY:   Following lower endoscopy (colonoscopy or flexible sigmoidoscopy):  Excessive amounts of blood in the stool  Significant tenderness or worsening of abdominal pains  Swelling of the abdomen that is new, acute  Fever of 100F or higher   Following upper endoscopy (EGD)  Vomiting of blood or coffee ground material  New chest pain or pain under the shoulder blades  Painful or persistently difficult swallowing  New shortness of breath  Fever of 100F or higher  Black,  tarry-looking stools  For urgent or emergent issues, a gastroenterologist can be reached at any hour by calling [phone]. Do not use MyChart messaging for urgent concerns.    DIET:  We do recommend a small meal at first, but then you may proceed to your regular diet.  Drink plenty of fluids but you should avoid alcoholic beverages for 24 hours.  ACTIVITY:  You should plan to take it easy for the rest of today and you should NOT DRIVE or use heavy machinery until tomorrow (because of the sedation medicines used during the test).    FOLLOW UP: Our staff will call the number listed on your records 48-72 hours following your procedure to check on you and address any questions or concerns that you may have regarding the information given to you following your procedure. If we do not reach you, we will leave a message.  We will attempt to reach you two times.  During this call, we will ask if you have developed any symptoms of COVID 19. If you develop any symptoms (ie: fever, flu-like symptoms, shortness of breath, cough etc.) before then, please call [phone].  If you test positive for Covid 19 in the 2 weeks post procedure, please call and report this information to [place].    If any biopsies were taken you will be contacted by phone or by letter within the next 1-3 weeks.  Please call us at [phone] if you have not heard about the biopsies in 3 weeks.  SIGNATURES/CONFIDENTIALITY: You and/or your care partner have signed paperwork which will be entered into your electronic medical record.  These signatures attest to the fact that that the information above on your After Visit Summary has been reviewed and is understood.  Full responsibility of the confidentiality of this discharge information lies with you and/or your care-partner.

## 2021-03-22 NOTE — Op Note (Signed)
[facility] Patient Name: Max Ward Procedure Date: 03/22/2021 3:49 PM MRN: [id number] Endoscopist: Mallie Mussel L. [name] , MD Age: 50 Referring MD:  Date of Birth: November 28, 1970 Gender: Male Account #: [id number] Procedure:                Colonoscopy Indications:              Lower abdominal pain, Constipation Medicines:                Monitored Anesthesia Care Procedure:                Pre-Anesthesia Assessment:                           - Prior to the procedure, a History and Physical                            was performed, and patient medications and                            allergies were reviewed. The patient's tolerance of                            previous anesthesia was also reviewed. The risks                            and benefits of the procedure and the sedation                            options and risks were discussed with the patient.                            All questions were answered, and informed consent                            was obtained. Prior Anticoagulants: The patient has                            taken Coumadin (warfarin), last dose was 4 days                            prior to procedure. ASA Grade Assessment: III - A                            patient with severe systemic disease. After                            reviewing the risks and benefits, the patient was                            deemed in satisfactory condition to undergo the                            procedure.  After obtaining informed consent, the colonoscope                            was passed under direct vision. Throughout the                            procedure, the patient's blood pressure, pulse, and                            oxygen saturations were monitored continuously. The                            Olympus CF-HQ190 [id number]) Colonoscope was                            introduced through the anus and advanced to the the                             terminal ileum, with identification of the                            appendiceal orifice and IC valve. The colonoscopy                            was performed without difficulty. The patient                            tolerated the procedure well. The quality of the                            bowel preparation was good. The terminal ileum,                            ileocecal valve, appendiceal orifice, and rectum                            were photographed. Scope In: 4:06:43 PM Scope Out: 4:26:59 PM Scope Withdrawal Time: 0 hours 15 minutes 20 seconds  Total Procedure Duration: 0 hours 20 minutes 16 seconds  Findings:                 The perianal and digital rectal examinations were                            normal.                           Three sessile polyps were found in the proximal                            descending colon. The polyps were diminutive in                            size. These polyps were removed with a cold snare.  Resection and retrieval were complete.                           A 8 mm polyp was found in the mid descending colon.                            The polyp was pedunculated. The polyp was removed                            with a hot snare. Resection and retrieval were                            complete.                           The exam was otherwise without abnormality on                            direct and retroflexion views. Complications:            No immediate complications. Estimated Blood Loss:     Estimated blood loss was minimal. Impression:               - Three diminutive polyps in the proximal                            descending colon, removed with a cold snare.                            Resected and retrieved.                           - One 8 mm polyp in the mid descending colon,                            removed with a hot snare. Resected and retrieved.                           - The  examination was otherwise normal on direct                            and retroflexion views.                           Functional constipation and possible side effect of                            ADD medication. Recommendation:           - Patient has a contact number available for                            emergencies. The signs and symptoms of potential                            delayed complications were discussed with  the                            patient. Return to normal activities tomorrow.                            Written discharge instructions were provided to the                            patient.                           - Resume previous diet.                           - Resume Coumadin (warfarin) at prior dose today.                            Refer to Coumadin Clinic for further adjustment of                            therapy.                           - Continue Linzess for relief of constipation.                           - Await pathology results.                           - Repeat colonoscopy is recommended for                            surveillance. The colonoscopy date will be                            determined after pathology results from today's                            exam become available for review.                           - See the other procedure note for documentation of                            additional recommendations. Jessa Stinson L. [name], MD 03/22/2021 4:41:03 PM This report has been signed electronically.

## 2021-03-22 NOTE — Progress Notes (Signed)
Called to room to assist during endoscopic procedure.  Patient ID and intended procedure confirmed with present staff. Received instructions for my participation in the procedure from the performing physician.  

## 2021-03-22 NOTE — Progress Notes (Signed)
A/ox3, pleased with MAC, report to RN 

## 2021-03-22 NOTE — Op Note (Signed)
Max Ward Patient Name: Max Ward Procedure Date: 03/22/2021 3:49 PM MRN: [id number] Endoscopist: Mallie Mussel L. Max Ward , MD Age: 50 Referring MD:  Date of Birth: [birth year] Gender: Male Account #: [id number] Procedure:                Upper GI endoscopy Indications:              Esophageal reflux symptoms that persist despite                            appropriate therapy Medicines:                Monitored Anesthesia Care Procedure:                Pre-Anesthesia Assessment:                           - Prior to the procedure, a History and Physical                            was performed, and patient medications and                            allergies were reviewed. The patient's tolerance of                            previous anesthesia was also reviewed. The risks                            and benefits of the procedure and the sedation                            options and risks were discussed with the patient.                            All questions were answered, and informed consent                            was obtained. Prior Anticoagulants: The patient has                            taken Coumadin (warfarin), last dose was 4 days                            prior to procedure. ASA Grade Assessment: III - A                            patient with severe systemic disease. After                            reviewing the risks and benefits, the patient was                            deemed in satisfactory condition to undergo the  procedure.                           After obtaining informed consent, the endoscope was                            passed under direct vision. Throughout the                            procedure, the patient's blood pressure, pulse, and                            oxygen saturations were monitored continuously. The                            Endoscope was introduced through the mouth, and                             advanced to the second part of duodenum. The upper                            GI endoscopy was accomplished without difficulty.                            The patient tolerated the procedure well. Scope In: Scope Out: Findings:                 The esophagus was normal.                           The stomach was normal.                           The cardia and gastric fundus were normal on                            retroflexion. (Hill Grade 1)                           The examined duodenum was normal. Complications:            No immediate complications. Estimated Blood Loss:     Estimated blood loss: none. Impression:               - Normal esophagus.                           - Normal stomach.                           - Normal examined duodenum.                           - No specimens collected. Recommendation:           - Patient has a contact number available for  emergencies. The signs and symptoms of potential                            delayed complications were discussed with the                            patient. Return to normal activities tomorrow.                            Written discharge instructions were provided to the                            patient.                           - Resume previous diet.                           - Resume Coumadin (warfarin) at prior dose today.                            Refer to Coumadin Clinic for further adjustment of                            therapy.                           - Follow an antireflux regimen indefinitely.                           - See the other procedure note for documentation of                            additional recommendations. Max Ward L. Max Carrow, MD 03/22/2021 4:42:56 PM This report has been signed electronically.

## 2021-03-22 NOTE — Progress Notes (Signed)
VS taken by [facility]

## 2021-03-24 ENCOUNTER — Telehealth: Payer: Self-pay | Admitting: *Deleted

## 2021-03-24 NOTE — Telephone Encounter (Signed)
No answer for post procedure call back. Left voicemail.   

## 2021-03-27 ENCOUNTER — Telehealth: Payer: Self-pay

## 2021-03-27 NOTE — Telephone Encounter (Signed)
Called and spoke w/pt who stated that he is overdue because he can't get strips and that he has been having a nervous breakdown and can't call to replace them. He stated that he would like to make an appt in office until straightened out but that he is unable to schedule an appt at this time

## 2021-03-28 ENCOUNTER — Encounter: Payer: Self-pay | Admitting: Gastroenterology

## 2021-04-03 ENCOUNTER — Telehealth: Payer: Self-pay

## 2021-04-03 NOTE — Telephone Encounter (Signed)
Called and spoke w/pt and discussed that he is overdue for warfarin self test and the pt stated that he was out of strips. I instructed him that he needs to call and get strips ordered and make an appt in office while awaiting shipment but he stated that he would call us back when he is good and ready

## 2021-04-10 LAB — POCT INR: INR: 2.5 (ref 2.0–3.0)

## 2021-04-11 ENCOUNTER — Ambulatory Visit (INDEPENDENT_AMBULATORY_CARE_PROVIDER_SITE_OTHER): Payer: Medicare Other | Admitting: Internal Medicine

## 2021-04-11 DIAGNOSIS — Z7901 Long term (current) use of anticoagulants: Secondary | ICD-10-CM | POA: Diagnosis not present

## 2021-04-11 DIAGNOSIS — Z952 Presence of prosthetic heart valve: Secondary | ICD-10-CM

## 2021-05-01 NOTE — Telephone Encounter (Signed)
This encounter was created in error - please disregard.

## 2021-05-08 ENCOUNTER — Telehealth: Payer: Self-pay

## 2021-05-08 NOTE — Telephone Encounter (Signed)
WARFARIN SELF TESTER OVERDUE

## 2021-05-12 LAB — POCT INR: INR: 2.5 (ref 2.0–3.0)

## 2021-05-15 ENCOUNTER — Ambulatory Visit (INDEPENDENT_AMBULATORY_CARE_PROVIDER_SITE_OTHER): Payer: Medicare Other | Admitting: Cardiology

## 2021-05-15 DIAGNOSIS — Z952 Presence of prosthetic heart valve: Secondary | ICD-10-CM | POA: Diagnosis not present

## 2021-05-15 DIAGNOSIS — Z7901 Long term (current) use of anticoagulants: Secondary | ICD-10-CM

## 2021-05-17 ENCOUNTER — Other Ambulatory Visit: Payer: Self-pay | Admitting: Family

## 2021-05-23 LAB — POCT INR: INR: 1.2 — AB (ref 2.0–3.0)

## 2021-05-26 ENCOUNTER — Telehealth: Payer: Self-pay

## 2021-05-26 NOTE — Telephone Encounter (Signed)
Lpm to check INR 

## 2021-05-29 ENCOUNTER — Ambulatory Visit (INDEPENDENT_AMBULATORY_CARE_PROVIDER_SITE_OTHER): Payer: Medicare Other | Admitting: Internal Medicine

## 2021-05-29 DIAGNOSIS — Z7901 Long term (current) use of anticoagulants: Secondary | ICD-10-CM

## 2021-05-29 DIAGNOSIS — Z952 Presence of prosthetic heart valve: Secondary | ICD-10-CM

## 2021-05-29 LAB — POCT INR: INR: 1.9 — AB (ref 2.0–3.0)

## 2021-06-05 ENCOUNTER — Telehealth: Payer: Self-pay

## 2021-06-05 NOTE — Telephone Encounter (Signed)
DUE FR A SELF TEST TODAY LMOM

## 2021-06-06 NOTE — Telephone Encounter (Signed)
Lmom for overdue inr 

## 2021-06-07 NOTE — Telephone Encounter (Signed)
Lmom for overdue inr 

## 2021-06-08 NOTE — Telephone Encounter (Signed)
Lmom for overdue inr 

## 2021-06-09 ENCOUNTER — Telehealth: Payer: Self-pay

## 2021-06-09 NOTE — Telephone Encounter (Signed)
Spoke with patient and asked if he had checked in INR (was due on 8/22). Patient stated he had been sick and was at his dad's. I requested for him to check his INR today and call office with results. Patient verbalized understanding and stated he would try to check INR and call with results as soon as possible.

## 2021-06-13 ENCOUNTER — Telehealth: Payer: Self-pay

## 2021-06-13 NOTE — Telephone Encounter (Signed)
LMOM FOR OVERDUE INR 

## 2021-06-14 NOTE — Telephone Encounter (Signed)
Lmom for overdue inr 

## 2021-06-15 NOTE — Telephone Encounter (Signed)
Lmom for overdue inr 

## 2021-06-16 ENCOUNTER — Ambulatory Visit (INDEPENDENT_AMBULATORY_CARE_PROVIDER_SITE_OTHER): Payer: Medicare Other | Admitting: Cardiovascular Disease

## 2021-06-16 DIAGNOSIS — Z7901 Long term (current) use of anticoagulants: Secondary | ICD-10-CM

## 2021-06-16 DIAGNOSIS — Z5181 Encounter for therapeutic drug level monitoring: Secondary | ICD-10-CM | POA: Diagnosis not present

## 2021-06-16 DIAGNOSIS — Z952 Presence of prosthetic heart valve: Secondary | ICD-10-CM | POA: Diagnosis not present

## 2021-06-16 LAB — POCT INR: INR: 2.2 (ref 2.0–3.0)

## 2021-07-03 ENCOUNTER — Telehealth: Payer: Self-pay

## 2021-07-03 NOTE — Telephone Encounter (Signed)
Overdue inr

## 2021-07-04 ENCOUNTER — Ambulatory Visit (INDEPENDENT_AMBULATORY_CARE_PROVIDER_SITE_OTHER): Payer: Medicare Other | Admitting: Cardiology

## 2021-07-04 DIAGNOSIS — Z7901 Long term (current) use of anticoagulants: Secondary | ICD-10-CM

## 2021-07-04 DIAGNOSIS — Z952 Presence of prosthetic heart valve: Secondary | ICD-10-CM

## 2021-07-04 DIAGNOSIS — Z5181 Encounter for therapeutic drug level monitoring: Secondary | ICD-10-CM | POA: Diagnosis not present

## 2021-07-04 LAB — POCT INR: INR: 5.5 — AB (ref 2.0–3.0)

## 2021-07-10 ENCOUNTER — Telehealth: Payer: Self-pay

## 2021-07-10 NOTE — Telephone Encounter (Signed)
Called and lmomed for overdue inr 

## 2021-07-11 NOTE — Telephone Encounter (Signed)
Lmom for overdue inr 

## 2021-07-12 NOTE — Telephone Encounter (Signed)
Lmom for overdue inr 

## 2021-07-14 ENCOUNTER — Ambulatory Visit (INDEPENDENT_AMBULATORY_CARE_PROVIDER_SITE_OTHER): Payer: Medicare Other | Admitting: Cardiology

## 2021-07-14 DIAGNOSIS — Z952 Presence of prosthetic heart valve: Secondary | ICD-10-CM

## 2021-07-14 DIAGNOSIS — Z7901 Long term (current) use of anticoagulants: Secondary | ICD-10-CM

## 2021-07-14 DIAGNOSIS — Z5181 Encounter for therapeutic drug level monitoring: Secondary | ICD-10-CM

## 2021-07-14 LAB — POCT INR: INR: 2.6 (ref 2.0–3.0)

## 2021-07-21 ENCOUNTER — Telehealth: Payer: Self-pay

## 2021-07-21 NOTE — Telephone Encounter (Signed)
Lpm to check INR 

## 2021-07-24 NOTE — Telephone Encounter (Signed)
Lmom for overdue to self test

## 2021-07-26 ENCOUNTER — Ambulatory Visit (INDEPENDENT_AMBULATORY_CARE_PROVIDER_SITE_OTHER): Payer: Medicare Other | Admitting: Cardiology

## 2021-07-26 ENCOUNTER — Telehealth: Payer: Self-pay

## 2021-07-26 DIAGNOSIS — Z952 Presence of prosthetic heart valve: Secondary | ICD-10-CM

## 2021-07-26 DIAGNOSIS — Z5181 Encounter for therapeutic drug level monitoring: Secondary | ICD-10-CM | POA: Diagnosis not present

## 2021-07-26 DIAGNOSIS — Z7901 Long term (current) use of anticoagulants: Secondary | ICD-10-CM

## 2021-07-26 LAB — POCT INR: INR: 3.6 — AB (ref 2.0–3.0)

## 2021-07-26 NOTE — Telephone Encounter (Signed)
Lpm to check INR 

## 2021-08-05 ENCOUNTER — Other Ambulatory Visit: Payer: Self-pay | Admitting: Gastroenterology

## 2021-08-07 ENCOUNTER — Other Ambulatory Visit: Payer: Self-pay | Admitting: *Deleted

## 2021-08-07 ENCOUNTER — Ambulatory Visit (INDEPENDENT_AMBULATORY_CARE_PROVIDER_SITE_OTHER): Payer: Medicare Other | Admitting: Cardiology

## 2021-08-07 DIAGNOSIS — Z5181 Encounter for therapeutic drug level monitoring: Secondary | ICD-10-CM | POA: Diagnosis not present

## 2021-08-07 LAB — POCT INR: INR: 5.2 — AB (ref 2.0–3.0)

## 2021-08-07 MED ORDER — WARFARIN SODIUM 5 MG PO TABS: ORAL_TABLET | ORAL | 0 refills | Status: DC

## 2021-08-07 NOTE — Telephone Encounter (Signed)
Prescription refill request received for warfarin Lov: acharya, 02/27/2021 Next INR check: 11/4 Warfarin tablet strength: 5mg    Refill sent.

## 2021-08-07 NOTE — Patient Instructions (Signed)
Description   Called and spoke to pt and instructed him to hold warfarin 10/25, 10/26 and 10/27 and then start taking warfarin 1 tablet daily except for 1/2 a tablet on Mondays. Recheck INR on 11/4- Self tester. [facility] [phone].   04/11/21: *Patient will call back to let us know if will get back injection or not*

## 2021-08-18 ENCOUNTER — Telehealth: Payer: Self-pay

## 2021-08-18 NOTE — Telephone Encounter (Signed)
I spoke to the patient and reminded him to check INR this weekend.  He verbalized understanding

## 2021-08-22 ENCOUNTER — Ambulatory Visit (INDEPENDENT_AMBULATORY_CARE_PROVIDER_SITE_OTHER): Payer: Medicare Other | Admitting: Cardiology

## 2021-08-22 DIAGNOSIS — Z5181 Encounter for therapeutic drug level monitoring: Secondary | ICD-10-CM | POA: Diagnosis not present

## 2021-08-22 LAB — POCT INR: INR: 5.2 — AB (ref 2.0–3.0)

## 2021-08-22 NOTE — Patient Instructions (Addendum)
Description   Called and spoke to pt instructed him to hold warfarin 11/9, 11/10 and 11/11. Then START taking warfarin 1 tablet daily except for 1/2 a tablet on Mondays. Recheck INR in 1 week.  04/11/21: *Patient will call back to let us know if will get back injection or not*

## 2021-08-31 ENCOUNTER — Telehealth: Payer: Self-pay

## 2021-08-31 NOTE — Telephone Encounter (Signed)
I spoke to the patient and reminded him to check INR.  He verbalized understanding

## 2021-09-04 ENCOUNTER — Telehealth: Payer: Self-pay

## 2021-09-04 NOTE — Telephone Encounter (Signed)
Lpm to check INR 

## 2021-09-06 ENCOUNTER — Ambulatory Visit (INDEPENDENT_AMBULATORY_CARE_PROVIDER_SITE_OTHER): Payer: Medicare Other | Admitting: Cardiovascular Disease

## 2021-09-06 DIAGNOSIS — Z952 Presence of prosthetic heart valve: Secondary | ICD-10-CM | POA: Diagnosis not present

## 2021-09-06 DIAGNOSIS — Z5181 Encounter for therapeutic drug level monitoring: Secondary | ICD-10-CM | POA: Diagnosis not present

## 2021-09-06 DIAGNOSIS — Z7901 Long term (current) use of anticoagulants: Secondary | ICD-10-CM

## 2021-09-06 LAB — POCT INR: INR: 3.2 — AB (ref 2.0–3.0)

## 2021-09-15 ENCOUNTER — Ambulatory Visit (INDEPENDENT_AMBULATORY_CARE_PROVIDER_SITE_OTHER): Payer: Medicare Other | Admitting: Cardiovascular Disease

## 2021-09-15 ENCOUNTER — Telehealth: Payer: Self-pay

## 2021-09-15 DIAGNOSIS — Z5181 Encounter for therapeutic drug level monitoring: Secondary | ICD-10-CM

## 2021-09-15 DIAGNOSIS — Z952 Presence of prosthetic heart valve: Secondary | ICD-10-CM | POA: Diagnosis not present

## 2021-09-15 DIAGNOSIS — Z7901 Long term (current) use of anticoagulants: Secondary | ICD-10-CM

## 2021-09-15 LAB — POCT INR: INR: 1.4 — AB (ref 2.0–3.0)

## 2021-09-15 NOTE — Telephone Encounter (Signed)
Lpm to check INR 

## 2021-09-22 ENCOUNTER — Ambulatory Visit (INDEPENDENT_AMBULATORY_CARE_PROVIDER_SITE_OTHER): Payer: Medicare Other | Admitting: Cardiovascular Disease

## 2021-09-22 DIAGNOSIS — Z952 Presence of prosthetic heart valve: Secondary | ICD-10-CM | POA: Diagnosis not present

## 2021-09-22 DIAGNOSIS — Z5181 Encounter for therapeutic drug level monitoring: Secondary | ICD-10-CM | POA: Diagnosis not present

## 2021-09-22 DIAGNOSIS — Z7901 Long term (current) use of anticoagulants: Secondary | ICD-10-CM

## 2021-09-22 LAB — POCT INR: INR: 1.7 — AB (ref 2.0–3.0)

## 2021-09-29 ENCOUNTER — Telehealth: Payer: Self-pay

## 2021-09-29 NOTE — Telephone Encounter (Signed)
I spoke to the patient and reminded him to check INR.

## 2021-10-03 ENCOUNTER — Ambulatory Visit (INDEPENDENT_AMBULATORY_CARE_PROVIDER_SITE_OTHER): Payer: Medicare Other | Admitting: Cardiovascular Disease

## 2021-10-03 DIAGNOSIS — Z5181 Encounter for therapeutic drug level monitoring: Secondary | ICD-10-CM | POA: Diagnosis not present

## 2021-10-03 DIAGNOSIS — Z952 Presence of prosthetic heart valve: Secondary | ICD-10-CM | POA: Diagnosis not present

## 2021-10-03 LAB — POCT INR: INR: 3.9 — AB (ref 2.0–3.0)

## 2021-10-03 NOTE — Patient Instructions (Signed)
Description   Called and spoke to pt and instructed him to hold warfarin today, tomorrow take 1/2 a tablet of warfarin,  then continue to take warfarin 1 tablet daily except for 1/2 a tablet on Mondays. Recheck INR in 1 week.   04/11/21: *Patient will call back to let us know if will get back injection or not*

## 2021-10-10 ENCOUNTER — Encounter: Payer: Self-pay | Admitting: Pharmacist

## 2021-10-10 DIAGNOSIS — Z952 Presence of prosthetic heart valve: Secondary | ICD-10-CM

## 2021-10-10 DIAGNOSIS — Z7901 Long term (current) use of anticoagulants: Secondary | ICD-10-CM

## 2021-10-10 NOTE — Progress Notes (Signed)
LMOM

## 2021-10-11 ENCOUNTER — Ambulatory Visit (INDEPENDENT_AMBULATORY_CARE_PROVIDER_SITE_OTHER): Payer: Medicare Other

## 2021-10-11 DIAGNOSIS — Z952 Presence of prosthetic heart valve: Secondary | ICD-10-CM

## 2021-10-11 DIAGNOSIS — Z7901 Long term (current) use of anticoagulants: Secondary | ICD-10-CM | POA: Diagnosis not present

## 2021-10-11 DIAGNOSIS — Z5181 Encounter for therapeutic drug level monitoring: Secondary | ICD-10-CM | POA: Diagnosis not present

## 2021-10-11 LAB — POCT INR
INR: 2.4 (ref 2.0–3.0)
INR: 2.4 (ref 2.0–3.0)
INR: 2.4 (ref 2.0–3.0)
INR: 2.4 (ref 2.0–3.0)

## 2021-10-11 NOTE — Progress Notes (Signed)
This encounter was created in error - please disregard.

## 2021-10-11 NOTE — Patient Instructions (Signed)
Description   Called and spoke to pt and instructed to continue to take warfarin 1 tablet daily except for 1/2 a tablet on Mondays. Recheck INR in 1 week.

## 2021-10-19 ENCOUNTER — Telehealth: Payer: Self-pay

## 2021-10-19 NOTE — Telephone Encounter (Signed)
Lpm to check INR 

## 2021-10-24 ENCOUNTER — Ambulatory Visit (INDEPENDENT_AMBULATORY_CARE_PROVIDER_SITE_OTHER): Payer: Medicare Other | Admitting: Cardiovascular Disease

## 2021-10-24 DIAGNOSIS — Z5181 Encounter for therapeutic drug level monitoring: Secondary | ICD-10-CM

## 2021-10-24 DIAGNOSIS — Z952 Presence of prosthetic heart valve: Secondary | ICD-10-CM

## 2021-10-24 DIAGNOSIS — Z7901 Long term (current) use of anticoagulants: Secondary | ICD-10-CM

## 2021-10-24 LAB — POCT INR: INR: 3.9 — AB (ref 2.0–3.0)

## 2021-10-31 ENCOUNTER — Telehealth: Payer: Self-pay

## 2021-10-31 NOTE — Telephone Encounter (Signed)
Lmom for due inr self tester

## 2021-11-01 ENCOUNTER — Telehealth: Payer: Self-pay

## 2021-11-01 NOTE — Telephone Encounter (Signed)
Lpm to check INR 

## 2021-11-02 ENCOUNTER — Ambulatory Visit (INDEPENDENT_AMBULATORY_CARE_PROVIDER_SITE_OTHER): Payer: Medicare Other | Admitting: Internal Medicine

## 2021-11-02 DIAGNOSIS — Z7901 Long term (current) use of anticoagulants: Secondary | ICD-10-CM

## 2021-11-02 DIAGNOSIS — Z952 Presence of prosthetic heart valve: Secondary | ICD-10-CM | POA: Diagnosis not present

## 2021-11-02 DIAGNOSIS — Z5181 Encounter for therapeutic drug level monitoring: Secondary | ICD-10-CM

## 2021-11-02 LAB — POCT INR: INR: 1.2 — AB (ref 2.0–3.0)

## 2021-11-04 ENCOUNTER — Other Ambulatory Visit: Payer: Self-pay | Admitting: Internal Medicine

## 2021-11-10 ENCOUNTER — Ambulatory Visit (INDEPENDENT_AMBULATORY_CARE_PROVIDER_SITE_OTHER): Payer: Medicare Other | Admitting: Cardiology

## 2021-11-10 DIAGNOSIS — Z952 Presence of prosthetic heart valve: Secondary | ICD-10-CM

## 2021-11-10 DIAGNOSIS — Z7901 Long term (current) use of anticoagulants: Secondary | ICD-10-CM

## 2021-11-10 DIAGNOSIS — Z5181 Encounter for therapeutic drug level monitoring: Secondary | ICD-10-CM | POA: Diagnosis not present

## 2021-11-10 LAB — POCT INR: INR: 3.5 — AB (ref 2.0–3.0)

## 2021-11-17 ENCOUNTER — Telehealth: Payer: Self-pay

## 2021-11-17 NOTE — Telephone Encounter (Signed)
Spoke to patient reminding him to check INR.  Verbalized understanding

## 2021-11-21 ENCOUNTER — Ambulatory Visit (INDEPENDENT_AMBULATORY_CARE_PROVIDER_SITE_OTHER): Payer: Medicare Other | Admitting: Pharmacist Clinician (PhC)/ Clinical Pharmacy Specialist

## 2021-11-21 DIAGNOSIS — Z7901 Long term (current) use of anticoagulants: Secondary | ICD-10-CM

## 2021-11-21 DIAGNOSIS — Z952 Presence of prosthetic heart valve: Secondary | ICD-10-CM | POA: Diagnosis not present

## 2021-11-21 DIAGNOSIS — Z5181 Encounter for therapeutic drug level monitoring: Secondary | ICD-10-CM | POA: Diagnosis not present

## 2021-11-21 LAB — POCT INR: INR: 3.7 — AB (ref 2.0–3.0)

## 2021-11-30 ENCOUNTER — Telehealth: Payer: Self-pay

## 2021-11-30 NOTE — Telephone Encounter (Signed)
Lpm to check INR 

## 2021-12-04 ENCOUNTER — Ambulatory Visit (INDEPENDENT_AMBULATORY_CARE_PROVIDER_SITE_OTHER): Payer: Medicare Other | Admitting: Cardiology

## 2021-12-04 DIAGNOSIS — Z7901 Long term (current) use of anticoagulants: Secondary | ICD-10-CM

## 2021-12-04 DIAGNOSIS — Z5181 Encounter for therapeutic drug level monitoring: Secondary | ICD-10-CM | POA: Diagnosis not present

## 2021-12-04 DIAGNOSIS — Z952 Presence of prosthetic heart valve: Secondary | ICD-10-CM

## 2021-12-04 LAB — POCT INR: INR: 2.6 (ref 2.0–3.0)

## 2021-12-11 ENCOUNTER — Ambulatory Visit (INDEPENDENT_AMBULATORY_CARE_PROVIDER_SITE_OTHER): Payer: Medicare Other | Admitting: Cardiology

## 2021-12-11 DIAGNOSIS — Z5181 Encounter for therapeutic drug level monitoring: Secondary | ICD-10-CM

## 2021-12-11 DIAGNOSIS — Z7901 Long term (current) use of anticoagulants: Secondary | ICD-10-CM | POA: Diagnosis not present

## 2021-12-11 DIAGNOSIS — Z952 Presence of prosthetic heart valve: Secondary | ICD-10-CM | POA: Diagnosis not present

## 2021-12-11 LAB — POCT INR: INR: 3 (ref 2.0–3.0)

## 2021-12-19 ENCOUNTER — Telehealth: Payer: Self-pay

## 2021-12-19 NOTE — Telephone Encounter (Signed)
Called and spoke w/pt and instructed them to check inr they stated that they also needed an refill for warfarin I advised the pt that once we receive their result we will be happy to send the refill and the pt voiced understanding.  ?

## 2021-12-20 LAB — POCT INR: INR: 1.8 — AB (ref 2.0–3.0)

## 2021-12-21 ENCOUNTER — Other Ambulatory Visit: Payer: Self-pay

## 2021-12-21 ENCOUNTER — Ambulatory Visit (INDEPENDENT_AMBULATORY_CARE_PROVIDER_SITE_OTHER): Payer: Medicare Other | Admitting: Internal Medicine

## 2021-12-21 DIAGNOSIS — Z952 Presence of prosthetic heart valve: Secondary | ICD-10-CM

## 2021-12-21 DIAGNOSIS — Z5181 Encounter for therapeutic drug level monitoring: Secondary | ICD-10-CM | POA: Diagnosis not present

## 2021-12-21 DIAGNOSIS — Z7901 Long term (current) use of anticoagulants: Secondary | ICD-10-CM

## 2021-12-21 MED ORDER — WARFARIN SODIUM 5 MG PO TABS: ORAL_TABLET | ORAL | 0 refills | Status: DC

## 2022-01-03 ENCOUNTER — Telehealth: Payer: Self-pay

## 2022-01-03 NOTE — Telephone Encounter (Signed)
Reminded pt to check INR. Verbalized understanding °

## 2022-01-08 LAB — POCT INR: INR: 1.9 — AB (ref 2.0–3.0)

## 2022-01-09 ENCOUNTER — Ambulatory Visit (INDEPENDENT_AMBULATORY_CARE_PROVIDER_SITE_OTHER): Payer: Medicare Other | Admitting: Cardiology

## 2022-01-09 DIAGNOSIS — Z5181 Encounter for therapeutic drug level monitoring: Secondary | ICD-10-CM | POA: Diagnosis not present

## 2022-01-09 NOTE — Patient Instructions (Signed)
Description   ?Called and spoke to pt and made him aware to start taking warfarin 1 tablet daily. Recheck INR in 1 week. [facility] [phone] ?  ? ? ?

## 2022-01-17 ENCOUNTER — Telehealth: Payer: Self-pay

## 2022-01-17 LAB — POCT INR: INR: 3 (ref 2.0–3.0)

## 2022-01-17 NOTE — Telephone Encounter (Signed)
Lpm to check INR 

## 2022-01-18 ENCOUNTER — Ambulatory Visit (INDEPENDENT_AMBULATORY_CARE_PROVIDER_SITE_OTHER): Payer: Medicare Other | Admitting: Internal Medicine

## 2022-01-18 DIAGNOSIS — Z7901 Long term (current) use of anticoagulants: Secondary | ICD-10-CM

## 2022-01-18 DIAGNOSIS — Z5181 Encounter for therapeutic drug level monitoring: Secondary | ICD-10-CM

## 2022-01-18 DIAGNOSIS — Z952 Presence of prosthetic heart valve: Secondary | ICD-10-CM

## 2022-01-29 NOTE — Progress Notes (Deleted)
Cardiology Office Note:    Date:  01/29/2022   ID:  Gadiel John Garlitz, DOB [birth year], MRN [id number]  PCP:  Houston Siren., MD   [facility] Providers Cardiologist:  Elouise Munroe, MD { Click to update primary MD,subspecialty MD or APP then REFRESH:1}    Referring MD: Houston Siren., MD   No chief complaint on file. ***  History of Present Illness:    ARYE WEYENBERG is a 51 y.o. male with a hx of anxiety, BPD 1, CAD, bicuspid aortic valve s/p mechanical AVR in 2014 and 46 mm ascending aortic dilation. Apparently s/p SVT ablation at [facility]. Dr. Margaretann Loveless has noted that his valves have been difficult to visualize on echocardiogram. Echo repeated 02/2021 and revealed stable valve function. He was deemed optimized to undergo colonoscopy without bridging with lovenox. Heart monitor 04/2019 showed a high burden of PVCs (25%). Dr. Margaretann Loveless recommended follow up with EP, but pt deferred. He was instructed to increase lopressor.   He also follows with cardiology at [facility] and carries a history of WPW. Appears to follow with two cardiologists. He has a history of labile INR, now followed in our coumadin clinic.   He presents today for preoperative risk evaluation for dental work.     Mechanical AVR - hx of bicuspid aortic valve - needs SBE PPX   Chronic coumadin therapy INR   Preoperative risk evaluation for MACE   Interruption of coumadin therapy I do not have a formal request. Will need more information about planned dental work. If extraction of less than 3 teeth, does not need to stop coumadin.     Past Medical History:  Diagnosis Date   Anal fissure    Anemia    Anxiety    Arthritis    Asthma    Bipolar 1 disorder (HCC)    Complication of anesthesia    woke up during colonoscoy   DDD (degenerative disc disease), cervical    Depression    DJD (degenerative joint disease)    GERD (gastroesophageal reflux disease)    HLD (hyperlipidemia)    Hypertension     SVT (supraventricular tachycardia) (HCC)    s/p prior ablation by Dr Seabron Spates at [facility] for PJRT    Past Surgical History:  Procedure Laterality Date   BONE TUMOR EXCISION     skull   CARDIAC CATHETERIZATION  2018   [facility] reveals normal cors   GANGLION CYST EXCISION Right 09/03/2018   Procedure: REMOVAL GANGLION OF RIGHT WRIST AND RIGHT ELBOW;  Surgeon: Leandrew Koyanagi, MD;  Location: [facility];  Service: Orthopedics;  Laterality: Right;  OTHER SITE IS ELBOW   MECHANICAL AORTIC VALVE REPLACEMENT      Current Medications: No outpatient medications have been marked as taking for the 01/30/22 encounter (Appointment) with Ledora Bottcher, [place].     Allergies:   Molds & smuts, Valium [diazepam], and Vicodin [hydrocodone-acetaminophen]   Social History   Socioeconomic History   Marital status: Legally Separated    Spouse name: Not on file   Number of children: 2   Years of education: Not on file   Highest education level: Not on file  Occupational History   Occupation: disabled  Tobacco Use   Smoking status: Every Day    Packs/day: 1.00    Years: 15.00    Pack years: 15.00    Types: Cigarettes   Smokeless tobacco: Never   Tobacco comments:    1/2 pack  or less  per day Jan 2020, just "puffs" on cigars now  Vaping Use   Vaping Use: Former  Substance and Sexual Activity   Alcohol use: Yes    Comment: 1-3 beers on occ.    Drug use: No   Sexual activity: Not on file  Other Topics Concern   Not on file  Social History Narrative   Lives in [place] with a friend.     disabled   Social Determinants of Radio broadcast assistant Strain: Not on file  Food Insecurity: Not on file  Transportation Needs: Not on file  Physical Activity: Not on file  Stress: Not on file  Social Connections: Not on file     Family History: The patient's ***family history includes Brain cancer in his maternal aunt; COPD in his father; Cancer in his paternal uncle; Colon cancer  in his maternal uncle; Dementia in his father; Diabetes in his paternal grandmother; Emphysema in his father; Esophageal cancer in his maternal grandfather; Lung cancer in his mother. There is no history of Stomach cancer.  ROS:   Please see the history of present illness.    *** All other systems reviewed and are negative.  EKGs/Labs/Other Studies Reviewed:    The following studies were reviewed today:  Echo 03/03/21:  1. Mechanical AVR NOS. DVI 0.44. No rocking or paravalular AI. Aortic  valve regurgitation is not visualized.   2. There is borderline dilatation of the ascending aorta, measuring 38  mm, but within normal limits for age and BSA.   3. Left ventricular ejection fraction, by estimation, is 55 to 60%. The  left ventricle has normal function. The left ventricle has no regional  wall motion abnormalities. There is mild concentric left ventricular  hypertrophy. Left ventricular diastolic  parameters were normal.   4. Right ventricular systolic function is normal. The right ventricular  size is normal.   5. The mitral valve is grossly normal. Trivial mitral valve  regurgitation. No evidence of mitral stenosis.   6. The inferior vena cava is normal in size with greater than 50%  respiratory variability, suggesting right atrial pressure of 3 mmHg.   EKG:  EKG is *** ordered today.  The ekg ordered today demonstrates ***  Recent Labs: No results found for requested labs within last 8760 hours.  Recent Lipid Panel No results found for: CHOL, TRIG, HDL, CHOLHDL, VLDL, LDLCALC, LDLDIRECT   Risk Assessment/Calculations:   {Does this patient have ATRIAL FIBRILLATION?:[phone]}       Physical Exam:    VS:  There were no vitals taken for this visit.    Wt Readings from Last 3 Encounters:  03/22/21 224 lb (101.6 kg)  02/27/21 224 lb 9.6 oz (101.9 kg)  01/31/21 224 lb (101.6 kg)     GEN: *** Well nourished, well developed in no acute distress HEENT: Normal NECK: No  JVD; No carotid bruits LYMPHATICS: No lymphadenopathy CARDIAC: ***RRR, no murmurs, rubs, gallops RESPIRATORY:  Clear to auscultation without rales, wheezing or rhonchi  ABDOMEN: Soft, non-tender, non-distended MUSCULOSKELETAL:  No edema; No deformity  SKIN: Warm and dry NEUROLOGIC:  Alert and oriented x 3 PSYCHIATRIC:  Normal affect   ASSESSMENT:    No diagnosis found. PLAN:    In order of problems listed above:  ***      {Are you ordering a CV Procedure (e.g. stress test, cath, DCCV, TEE, etc)?   Press F2        :[id number]}    Medication Adjustments/Labs and Tests Ordered:  Current medicines are reviewed at length with the patient today.  Concerns regarding medicines are outlined above.  No orders of the defined types were placed in this encounter.  No orders of the defined types were placed in this encounter.   There are no Patient Instructions on file for this visit.   Signed, Ledora Bottcher, PA  01/29/2022 11:10 PM    [facility]

## 2022-01-30 ENCOUNTER — Ambulatory Visit: Payer: Medicare Other | Admitting: Physician Assistant

## 2022-01-30 NOTE — Progress Notes (Signed)
? ? ?Office Visit  ?  ?Patient Name: Max Ward ?Date of Encounter: 02/01/2022 ? ?Primary Care Provider:  Houston Siren., MD ?Primary Cardiologist:  Elouise Munroe, MD ?Primary Electrophysiologist: None ?Chief Complaint  ?  ?Surgical Clearance for Dental Procedure ? ? Patient Profile: ?AVR s/p Mechanical AV Valve ?Essential HTN ?CAD ?Aortic dilation 46 mm ?HLD ?GERD ?Bipolar I and Depression ?PVCs  ? ? Recent Studies: ? ?03/2019 Zio Monitor: High PVC's, no VT or SVT ?02/2021 2D Echo: EF 55-60%, no AVR, nor LV function, mild LVH, borderline dilation of ascending aorta measuring 38 mm ? ?History of Present Illness  ?  ?Max Ward is a 51 y.o. male with PMH of HTN, HLD, GERD, bipolar 1 and depression, CAD, bicuspid AVR s/p mechanical AV valve 2014 (on Coumadin).  Patient presented to ED on 06/2018 with complaints of atypical chest pain.  Nuclear stress test was performed and revealed inferior ischemia.  LHC was performed and showed no occlusive disease and very mild calcifications.  Patient has history of PVCs and was referred to EP in 2020 for management. ? ?Mr. Max Ward patient was last seen by Dr. Margaretann Loveless on 02/2021 for preprocedural visit prior to colonoscopy.  Patient was deemed intermediate risk for his procedure and was instructed to hold Coumadin without bridging due to stable AV prosthesis. ? ?Since last being seen in our clinic the patient reports doing well and has no acute cardiac issues or concerns at this time.  Patient presents today for procedural clearance for dental procedure.  He is planning to have 4 teeth extracted and will require SBE prophylaxis and guidance for holding Coumadin. He denies chest pain, dyspnea, PND, orthopnea, nausea, vomiting, dizziness, syncope, edema, weight gain, or early satiety.  He was concerned today that his primary care advised him that he was having a heart attack during last office visit.  His EKG today was reassuring and indicated sinus rhythm with  PVCs. ? ?Past Medical History  ?  ?Past Medical History:  ?Diagnosis Date  ? Anal fissure   ? Anemia   ? Anxiety   ? Arthritis   ? Asthma   ? Bipolar 1 disorder ([place])   ? Complication of anesthesia   ? woke up during colonoscoy  ? DDD (degenerative disc disease), cervical   ? Depression   ? DJD (degenerative joint disease)   ? GERD (gastroesophageal reflux disease)   ? HLD (hyperlipidemia)   ? Hypertension   ? SVT (supraventricular tachycardia) (HCC)   ? s/p prior ablation by Dr Seabron Spates at [facility] for [place]  ? ?Past Surgical History:  ?Procedure Laterality Date  ? BONE TUMOR EXCISION    ? skull  ? CARDIAC CATHETERIZATION  2018  ? [facility] reveals normal cors  ? GANGLION CYST EXCISION Right 09/03/2018  ? Procedure: REMOVAL GANGLION OF RIGHT WRIST AND RIGHT ELBOW;  Surgeon: Leandrew Koyanagi, MD;  Location: [facility];  Service: Orthopedics;  Laterality: Right;  OTHER SITE IS ELBOW  ? MECHANICAL AORTIC VALVE REPLACEMENT    ? ? ?Allergies ? ?Allergies  ?Allergen Reactions  ? M[employer] Other (See Comments)  ?  Unknown  ? Valium [Diazepam] Palpitations  ? Vicodin [Hydrocodone-Acetaminophen] Palpitations  ? ? ?Home Medications  ?  ?Current Outpatient Medications  ?Medication Sig Dispense Refill  ? ADVAIR DISKUS 250-50 MCG/DOSE AEPB INHALE 1 PUFF BY MOUTH TWICE A DAY 180 each 1  ? albuterol (VENTOLIN HFA) 108 (90 Base) MCG/ACT inhaler INHALE 1-2 PUFFS  BY MOUTH EVERY 4-6 HOURS AS NEEDED AND AS DIRECTED 18 each 5  ? alprazolam (XANAX) 2 MG tablet Take 2 mg by mouth 3 (three) times daily as needed for sleep.    ? amoxicillin (AMOXIL) 500 MG tablet Take 4 tablets (2,000 mg total) by mouth once for 1 dose. 30-60 minutes before dental procedure 4 tablet 0  ? amphetamine-dextroamphetamine (ADDERALL XR) 25 MG 24 hr capsule Take 1 capsule by mouth 2 (two) times daily.    ? Asenapine Maleate 10 MG SUBL Place 30 mg under the tongue at bedtime.    ? azelastine (ASTELIN) 0.1 % nasal spray Place 1 spray into both nostrils 2  (two) times daily. Use in each nostril as directed    ? cetirizine (ZYRTEC) 10 MG tablet Take 1 tablet (10 mg total) by mouth at bedtime. Additional refills from pcp: Dr.Kremer 30 tablet 0  ? dicyclomine (BENTYL) 10 MG capsule TAKE 1 CAPSULE BY MOUTH 2 TIMES DAILY. 180 capsule 1  ? esomeprazole (NEXIUM) 40 MG capsule Take 1 capsule (40 mg total) by mouth daily at 12 noon. 30 capsule 5  ? fluticasone (FLONASE) 50 MCG/ACT nasal spray Place 1 spray into both nostrils daily. 16 g 2  ? hydrOXYzine (VISTARIL) 50 MG capsule Take 1 capsule by mouth as needed for anxiety.    ? metoprolol succinate (TOPROL-XL) 50 MG 24 hr tablet Take 50 mg by mouth daily. Take with or immediately following a meal.    ? montelukast (SINGULAIR) 10 MG tablet TAKE 1 TABLET BY MOUTH EVERY DAY 90 tablet 0  ? traZODone (DESYREL) 100 MG tablet Take 300 mg by mouth at bedtime.    ? warfarin (COUMADIN) 5 MG tablet Take 1/2 a tablet to 1 tablet by mouth daily as directed by the coumadin clinic. 90 tablet 0  ? linaclotide (LINZESS) 290 MCG CAPS capsule Take 1 capsule (290 mcg total) by mouth daily before breakfast. (Patient not taking: Reported on 02/27/2021) 30 capsule 3  ? ?No current facility-administered medications for this visit.  ?  ? ?Review of Systems  ?Please see the history of present illness.    ?(+) Back pain ?(+) Indigestion ? ?All other systems reviewed and are otherwise negative except as noted above. ? ?Physical Exam  ?  ?Wt Readings from Last 3 Encounters:  ?02/01/22 230 lb (104.3 kg)  ?03/22/21 224 lb (101.6 kg)  ?02/27/21 224 lb 9.6 oz (101.9 kg)  ? ?VS: ?Vitals:  ? 02/01/22 1342  ?BP: 116/82  ?Pulse: 87  ?SpO2: 93%  ?,Body mass index is 34.46 kg/m?. ? ?Constitutional:   ?   Appearance: Healthy appearance. Not in distress.  ?Neck:  ?   Vascular: JVD normal.  ?Pulmonary:  ?   Effort: Pulmonary effort is normal.  ?   Breath sounds: No wheezing. No rales.  ?Cardiovascular:  ?   Normal rate. Irregular rhythm with normal rate S1. Normal S2  audible click.  Murmurs: No murmur ?Edema: ?   Peripheral edema absent.  ?Abdominal:  ?   Palpations: Abdomen is soft. There is no hepatomegaly.  ?Skin: ?   General: Skin is warm and dry.  ?Neurological:  ?   General: No focal deficit present.  ?   Mental Status: Alert and oriented to person, place and time.  ?   Cranial Nerves: Cranial nerves are intact.  ?EKG/LABS/Other Studies Reviewed  ?  ?ECG personally reviewed by me today -sinus rhythm with PVCs and left ventricular hypertrophy with rate of 87- no  acute changes. ? ?Lab Results  ?Component Value Date  ? WBC 10.0 09/01/2019  ? HGB 16.4 09/01/2019  ? HCT 47.8 09/01/2019  ? MCV 89.7 09/01/2019  ? PLT 240 09/01/2019  ? ?Lab Results  ?Component Value Date  ? CREATININE 1.14 08/31/2019  ? BUN 11 08/31/2019  ? NA 136 08/31/2019  ? K 4.5 08/31/2019  ? CL 103 08/31/2019  ? CO2 20 (L) 08/31/2019  ? ?Lab Results  ?Component Value Date  ? ALT 35 09/01/2019  ? AST 23 09/01/2019  ? ALKPHOS 64 09/01/2019  ? BILITOT 1.5 (H) 09/01/2019  ? ?No results found for: CHOL, HDL, LDLCALC, LDLDIRECT, TRIG, CHOLHDL  ?No results found for: HGBA1C ? ?Assessment & Plan  ?  ?1.Preoperative Surgical Clearance: Dental extraction ?746} ?Mr. Raney's perioperative risk of a major cardiac event is 0.4% according to the Revised Cardiac Risk Index (RCRI).  Therefore, he is at low risk for perioperative complications.   His functional capacity is good at 5.08 METs according to the Duke Activity Status Index (DASI). ?Recommendations: ?According to ACC/AHA guidelines, no further cardiovascular testing needed.  The patient may proceed to surgery at acceptable risk.   ?Antiplatelet and/or Anticoagulation Recommendations: ?Patient's information will be forwarded to preoperative clearance team with recommendations provided by pharmacy regarding Coumadin after information provided regarding amount of teeth and nature of procedure ? ?2. S/P AVR with Mechanical Valve: ?-Patient is currently on Coumadin  and recommendations for holding will be determined once patient provides information regarding nature of procedure and amount of teeth being extracted. ?-SBE prophylaxis discussed and patient prescribed

## 2022-02-01 ENCOUNTER — Encounter: Payer: Self-pay | Admitting: General Practice

## 2022-02-01 ENCOUNTER — Ambulatory Visit (INDEPENDENT_AMBULATORY_CARE_PROVIDER_SITE_OTHER): Payer: Medicare Other | Admitting: Nurse Practitioner

## 2022-02-01 VITALS — BP 116/82 | HR 87 | Ht 68.5 in | Wt 230.0 lb

## 2022-02-01 DIAGNOSIS — Z01810 Encounter for preprocedural cardiovascular examination: Secondary | ICD-10-CM | POA: Diagnosis not present

## 2022-02-01 DIAGNOSIS — I10 Essential (primary) hypertension: Secondary | ICD-10-CM | POA: Diagnosis not present

## 2022-02-01 DIAGNOSIS — Z952 Presence of prosthetic heart valve: Secondary | ICD-10-CM | POA: Diagnosis not present

## 2022-02-01 MED ORDER — AMOXICILLIN 500 MG PO TABS: 2000.0000 mg | ORAL_TABLET | Freq: Once | ORAL | 0 refills | Status: AC

## 2022-02-01 NOTE — Patient Instructions (Signed)
Medication Instructions:  ?Take 2,'000mg'$  of Amoxicillin 30-60 minutes before your dental procedure  ? ?*If you need a refill on your cardiac medications before your next appointment, please call your pharmacy* ? ?Lab Work:   Testing/Procedures:  ?NONE    NONE ?If you have labs (blood work) drawn today and your tests are completely normal, you will receive your results only by: ?MyChart Message (if you have MyChart) OR  A paper copy in the mail ?If you have any lab test that is abnormal or we need to change your treatment, we will call you to review the results. ? ?Special Instructions ?PLEASE HAVE DENTIST FAX OVER PRE-OP CARDIAC CLEARANCE REQUEST [phone] ? ?Follow-Up: ?Your next appointment:  9-12 month(s) In Person with Elouise Munroe, MD    ? ?Please call our office 2 months in advance to schedule this appointment  ? ?At [facility], you and your health needs are our priority.  As part of our continuing mission to provide you with exceptional heart care, we have created designated Provider Care Teams.  These Care Teams include your primary Cardiologist (physician) and Advanced Practice Providers (APPs -  Physician Assistants and Nurse Practitioners) who all work together to provide you with the care you need, when you need it. ? ?We recommend signing up for the patient portal called "MyChart".  Sign up information is provided on this After Visit Summary.  MyChart is used to connect with patients for Virtual Visits (Telemedicine).  Patients are able to view lab/test results, encounter notes, upcoming appointments, etc.  Non-urgent messages can be sent to your provider as well.   ?To learn more about what you can do with MyChart, go to [email or web address].   ? ? ? ?Important Information About Sugar ? ? ? ? ? ? ? ?  ? ?

## 2022-02-02 ENCOUNTER — Telehealth: Payer: Self-pay

## 2022-02-02 ENCOUNTER — Ambulatory Visit (INDEPENDENT_AMBULATORY_CARE_PROVIDER_SITE_OTHER): Payer: Medicare Other | Admitting: Internal Medicine

## 2022-02-02 DIAGNOSIS — Z7901 Long term (current) use of anticoagulants: Secondary | ICD-10-CM

## 2022-02-02 DIAGNOSIS — Z5181 Encounter for therapeutic drug level monitoring: Secondary | ICD-10-CM | POA: Diagnosis not present

## 2022-02-02 DIAGNOSIS — Z952 Presence of prosthetic heart valve: Secondary | ICD-10-CM | POA: Diagnosis not present

## 2022-02-02 LAB — POCT INR: INR: 2.6 (ref 2.0–3.0)

## 2022-02-02 NOTE — Telephone Encounter (Signed)
done

## 2022-02-08 ENCOUNTER — Telehealth: Payer: Self-pay | Admitting: Internal Medicine

## 2022-02-08 NOTE — Telephone Encounter (Signed)
Message left that office note was faxed clearing pt for procedure ./cy ?

## 2022-02-08 NOTE — Telephone Encounter (Signed)
Sandy calling to f/u on Medical Clearance that was sent via fax on 02/01/22. She states that they have not gotten an response. She also sent again today via fax to [phone]. Please advise ?

## 2022-02-10 LAB — POCT INR: INR: 2.5 (ref 2.0–3.0)

## 2022-02-12 ENCOUNTER — Telehealth: Payer: Self-pay | Admitting: Internal Medicine

## 2022-02-12 ENCOUNTER — Ambulatory Visit (INDEPENDENT_AMBULATORY_CARE_PROVIDER_SITE_OTHER): Payer: Medicare Other | Admitting: Cardiology

## 2022-02-12 DIAGNOSIS — Z7901 Long term (current) use of anticoagulants: Secondary | ICD-10-CM

## 2022-02-12 DIAGNOSIS — Z5181 Encounter for therapeutic drug level monitoring: Secondary | ICD-10-CM

## 2022-02-12 DIAGNOSIS — Z952 Presence of prosthetic heart valve: Secondary | ICD-10-CM | POA: Diagnosis not present

## 2022-02-12 NOTE — Telephone Encounter (Signed)
Patient with diagnosis of mechanical AVR on warfarin for anticoagulation.   ? ?Procedure: extraction of 4 teeth ?Date of procedure: TBD ? ?Patient DOES require pre-op antibiotics for dental procedure. ? ?Per office protocol, patient can hold warfarin for 5 days prior to procedure.   ? ?Patient will NOT need bridging with Lovenox (enoxaparin) around procedure. ? ?Please resume warfarin same day as procedure ? ?

## 2022-02-12 NOTE — Telephone Encounter (Signed)
? ?  Patient Name: Max Ward  ?DOB: 1971-07-13 ?MRN: [id number] ? ?Primary Cardiologist: Elouise Munroe, MD ? ?Chart reviewed as part of pre-operative protocol coverage. To summarize recommendations: ? ?- Patient was seen in office 02/01/22 for pre-operative evaluation and cleared to proceed as requested, with recommendation to observe SBE prophylaxis guidelines with amoxicillin prescribed as 2g (30-60 minutes prior to dental procedure (rx confirmed per [facility] on 02/01/22).  ? ?- Regarding anticoagulation, "Per office protocol, patient can hold warfarin for 5 days prior to procedure.Patient will NOT need bridging with Lovenox (enoxaparin) around procedure. Please resume warfarin same day as procedure" ? ?Will route this bundled recommendation and Ambrose Pancoast NP's recent office note to requesting provider via Epic fax function. Please call with questions. ? ?Charlie Pitter, PA-C ?02/12/2022, 5:02 PM ? ? ?

## 2022-02-12 NOTE — Telephone Encounter (Signed)
Will route to pharm team for input on anticoagulation, otherwise it looks like pt had preop visit for this 02/01/22 to reference for clearance. ?

## 2022-02-12 NOTE — Telephone Encounter (Signed)
? ?  Pre-operative Risk Assessment  ?  ?Patient Name: Max Ward  ?DOB: [birth year] ?MRN: [id number]  ? ? ? ?Request for Surgical Clearance   ? ?Procedure:  Dental Extraction - Amount of Teeth to be Pulled:  4 ? ?Date of Surgery:  Clearance TBD                              ?   ?Surgeon:  Dr. Tamela Oddi  ?Surgeon's Group or Practice Name:  [facility] ?Phone number:  [phone] ?Fax number:  [phone] ?  ?Type of Clearance Requested:   ?- Pharmacy:  Hold Warfarin (Coumadin) TBD by Cardiology  ?  ?Type of Anesthesia:  Local  ?  ?Additional requests/questions:   Ask to speak with [name], needs clarification on holding coumadin and when to resume. States it was not listed on previous clearance received.   ? ?Signed, ?Trilby Drummer   ?02/12/2022, 8:58 AM   ?

## 2022-02-16 NOTE — Telephone Encounter (Signed)
Records faxed to [facility]. ?

## 2022-02-16 NOTE — Telephone Encounter (Signed)
Pre-op covering team: [place] has requested a paper copy be faxed. Can you please print [name]'s note below as well as the office visit note from 02/01/2022 with Ambrose Pancoast and fax them to requested number? ? ?Thank you so much! ?Brette Cast ?

## 2022-02-16 NOTE — Telephone Encounter (Signed)
[facility] is requesting a paper copy of this documentation be faxed to their office. Please fax to [phone] ?

## 2022-02-21 LAB — POCT INR: INR: 2.5 (ref 2.0–3.0)

## 2022-02-22 ENCOUNTER — Telehealth: Payer: Self-pay | Admitting: Pharmacist

## 2022-02-22 NOTE — Telephone Encounter (Signed)
Patient called and was confused regarding whether he needed a Lovenox bridge for his dental procedure.  Advised that no bridge was required and he should hold warfarin for 5 days.  Next INR is due 02/26/22.  Patient's father just passed away. ?

## 2022-03-01 ENCOUNTER — Telehealth: Payer: Self-pay

## 2022-03-01 NOTE — Telephone Encounter (Signed)
Reminded patient to check INR 

## 2022-03-09 ENCOUNTER — Ambulatory Visit (INDEPENDENT_AMBULATORY_CARE_PROVIDER_SITE_OTHER): Payer: Medicare Other | Admitting: Cardiology

## 2022-03-09 DIAGNOSIS — Z952 Presence of prosthetic heart valve: Secondary | ICD-10-CM

## 2022-03-09 DIAGNOSIS — Z5181 Encounter for therapeutic drug level monitoring: Secondary | ICD-10-CM | POA: Diagnosis not present

## 2022-03-09 DIAGNOSIS — Z7901 Long term (current) use of anticoagulants: Secondary | ICD-10-CM

## 2022-03-09 LAB — POCT INR: INR: 3.5 — AB (ref 2.0–3.0)

## 2022-03-16 ENCOUNTER — Telehealth: Payer: Self-pay

## 2022-03-16 NOTE — Telephone Encounter (Signed)
Lpm to check INR 

## 2022-03-27 ENCOUNTER — Telehealth: Payer: Self-pay | Admitting: *Deleted

## 2022-03-27 NOTE — Telephone Encounter (Addendum)
Pt self checks INR. Received result pt's INR was 3.0 on 03/22/2022. However, did not receive INR result until today 03/27/2022.    Called pt who stated that he has been holding his warfarin for 5 days because he had 4 teeth removed today. (Clearence note from 02/12/2022 and phone note from 02/22/2022.)  Pt stated the doctor who removed his teeth stated he could resume his warfarin today if his bleeding stops or tomorrow. Pt stated that his mouth is still bleeding and will resume warfarin tomorrow.   Informed pt to recheck INR on 04/02/2022 and to call the coumadin clinic when he checks INR.

## 2022-04-02 ENCOUNTER — Telehealth: Payer: Self-pay

## 2022-04-02 NOTE — Telephone Encounter (Signed)
Lpm to check INR 

## 2022-04-03 ENCOUNTER — Ambulatory Visit (INDEPENDENT_AMBULATORY_CARE_PROVIDER_SITE_OTHER): Payer: Medicare Other | Admitting: Cardiovascular Disease

## 2022-04-03 DIAGNOSIS — Z952 Presence of prosthetic heart valve: Secondary | ICD-10-CM

## 2022-04-03 DIAGNOSIS — Z7901 Long term (current) use of anticoagulants: Secondary | ICD-10-CM

## 2022-04-03 DIAGNOSIS — Z5181 Encounter for therapeutic drug level monitoring: Secondary | ICD-10-CM | POA: Diagnosis not present

## 2022-04-03 LAB — POCT INR: INR: 2.5 (ref 2.0–3.0)

## 2022-04-16 ENCOUNTER — Ambulatory Visit (INDEPENDENT_AMBULATORY_CARE_PROVIDER_SITE_OTHER): Payer: Medicare Other | Admitting: *Deleted

## 2022-04-16 DIAGNOSIS — Z7901 Long term (current) use of anticoagulants: Secondary | ICD-10-CM | POA: Diagnosis not present

## 2022-04-16 DIAGNOSIS — Z952 Presence of prosthetic heart valve: Secondary | ICD-10-CM

## 2022-04-16 LAB — POCT INR: INR: 2.3 (ref 2.0–3.0)

## 2022-04-16 NOTE — Patient Instructions (Signed)
Description   Spoke with pt and instructed pt to continue taking warfarin 1 tablet daily. Recheck INR in 2 weeks. Coumadin Clinic [phone];

## 2022-04-30 ENCOUNTER — Ambulatory Visit (INDEPENDENT_AMBULATORY_CARE_PROVIDER_SITE_OTHER): Payer: Medicare Other

## 2022-04-30 DIAGNOSIS — Z952 Presence of prosthetic heart valve: Secondary | ICD-10-CM | POA: Diagnosis not present

## 2022-04-30 DIAGNOSIS — Z7901 Long term (current) use of anticoagulants: Secondary | ICD-10-CM | POA: Diagnosis not present

## 2022-04-30 LAB — POCT INR: INR: 2.9 (ref 2.0–3.0)

## 2022-04-30 NOTE — Patient Instructions (Signed)
Description   Spoke with pt and instructed to only take 0.5 tablet today and then continue taking warfarin 1 tablet daily. Recheck INR in 2 weeks.  Coumadin Clinic [phone];

## 2022-05-01 ENCOUNTER — Telehealth: Payer: Self-pay

## 2022-05-01 ENCOUNTER — Encounter (INDEPENDENT_AMBULATORY_CARE_PROVIDER_SITE_OTHER): Payer: Medicare Other | Admitting: *Deleted

## 2022-05-01 DIAGNOSIS — Z952 Presence of prosthetic heart valve: Secondary | ICD-10-CM | POA: Diagnosis not present

## 2022-05-01 DIAGNOSIS — Z7901 Long term (current) use of anticoagulants: Secondary | ICD-10-CM | POA: Diagnosis not present

## 2022-05-01 LAB — POCT INR: INR: 2.9 (ref 2.0–3.0)

## 2022-05-01 NOTE — Telephone Encounter (Signed)
Lpmtcb and discuss INR result. °

## 2022-05-01 NOTE — Progress Notes (Signed)
This encounter was created in error - please disregard.

## 2022-05-01 NOTE — Telephone Encounter (Signed)
INR result completed and discussed with pt on 04/30/2022; see anticoagulation encounter for details.

## 2022-05-15 ENCOUNTER — Telehealth: Payer: Self-pay

## 2022-05-15 NOTE — Telephone Encounter (Signed)
Lpm to check INR 

## 2022-05-15 NOTE — Telephone Encounter (Signed)
done

## 2022-05-16 ENCOUNTER — Ambulatory Visit (INDEPENDENT_AMBULATORY_CARE_PROVIDER_SITE_OTHER): Payer: Medicare Other | Admitting: *Deleted

## 2022-05-16 DIAGNOSIS — Z7901 Long term (current) use of anticoagulants: Secondary | ICD-10-CM | POA: Diagnosis not present

## 2022-05-16 DIAGNOSIS — Z952 Presence of prosthetic heart valve: Secondary | ICD-10-CM

## 2022-05-16 LAB — POCT INR: INR: 4.4 — AB (ref 2.0–3.0)

## 2022-05-16 NOTE — Patient Instructions (Addendum)
Description   Spoke with pt and instructed to hold today and tomorrow's dose of warfarin then continue taking warfarin 1 tablet daily. Recheck INR in 1 week (normally 2 weeks).  Coumadin Clinic [phone]

## 2022-05-24 ENCOUNTER — Telehealth: Payer: Self-pay

## 2022-05-24 NOTE — Telephone Encounter (Signed)
Lpm to check INR 

## 2022-05-25 ENCOUNTER — Telehealth: Payer: Self-pay

## 2022-05-25 NOTE — Telephone Encounter (Signed)
Lpm to check INR 

## 2022-05-28 ENCOUNTER — Telehealth: Payer: Self-pay

## 2022-05-28 NOTE — Telephone Encounter (Signed)
Pt overdue to check INR. Called, no answer. Left message.

## 2022-05-29 ENCOUNTER — Telehealth: Payer: Self-pay

## 2022-05-29 NOTE — Telephone Encounter (Signed)
Lpm to check INR 

## 2022-05-30 ENCOUNTER — Telehealth: Payer: Self-pay | Admitting: *Deleted

## 2022-05-30 ENCOUNTER — Ambulatory Visit (INDEPENDENT_AMBULATORY_CARE_PROVIDER_SITE_OTHER): Payer: Medicare Other | Admitting: *Deleted

## 2022-05-30 DIAGNOSIS — Z952 Presence of prosthetic heart valve: Secondary | ICD-10-CM | POA: Diagnosis not present

## 2022-05-30 DIAGNOSIS — Z7901 Long term (current) use of anticoagulants: Secondary | ICD-10-CM | POA: Diagnosis not present

## 2022-05-30 LAB — POCT INR: INR: 3.3 — AB (ref 2.0–3.0)

## 2022-05-30 NOTE — Patient Instructions (Signed)
Description   Spoke with pt and instructed to hold today's dose of warfarin then start taking warfarin 1 tablet daily except 1/2 on Sundays. Recheck INR in 2 weeks-advised that he needs to adhere to routine. Coumadin Clinic [phone]

## 2022-05-30 NOTE — Telephone Encounter (Signed)
Called pt since he is overdue for INR monitoring; left a message to perform home monitoring test and report to company. Will continue to follow up.

## 2022-06-15 ENCOUNTER — Telehealth: Payer: Self-pay

## 2022-06-15 NOTE — Telephone Encounter (Signed)
Lpm to check INR 

## 2022-06-20 ENCOUNTER — Telehealth: Payer: Self-pay

## 2022-06-20 NOTE — Telephone Encounter (Signed)
Pt was due to check INR on 06/13/22. Pt has been contacted multiple times concerning overdue INR. Called pt today, no answer. Left voicemail making pt aware that INR should be checked as instructed to be eligible for home testing.

## 2022-06-22 ENCOUNTER — Telehealth: Payer: Self-pay

## 2022-06-22 NOTE — Telephone Encounter (Signed)
Lpm to check INR 

## 2022-06-25 ENCOUNTER — Telehealth: Payer: Self-pay

## 2022-06-25 NOTE — Telephone Encounter (Signed)
Lpm to check INR 

## 2022-07-03 ENCOUNTER — Telehealth: Payer: Self-pay | Admitting: *Deleted

## 2022-07-03 NOTE — Telephone Encounter (Signed)
Called pt since he is overdue for INR monitoring; there was no answer so left a message for him to call back.

## 2022-07-09 ENCOUNTER — Ambulatory Visit (INDEPENDENT_AMBULATORY_CARE_PROVIDER_SITE_OTHER): Payer: Medicare Other

## 2022-07-09 DIAGNOSIS — Z952 Presence of prosthetic heart valve: Secondary | ICD-10-CM | POA: Diagnosis not present

## 2022-07-09 DIAGNOSIS — Z7901 Long term (current) use of anticoagulants: Secondary | ICD-10-CM | POA: Diagnosis not present

## 2022-07-09 LAB — POCT INR: INR: 2.7 (ref 2.0–3.0)

## 2022-07-09 NOTE — Patient Instructions (Signed)
Description   Spoke with pt and instructed to only take 1/2 tablet today and then continue taking warfarin 1 tablet daily except 1/2 on Sundays.  Recheck INR in 2 weeks-advised that he needs to recommended recheck time.  Coumadin Clinic [phone]

## 2022-07-23 ENCOUNTER — Encounter: Payer: Self-pay | Admitting: Internal Medicine

## 2022-07-23 ENCOUNTER — Ambulatory Visit (INDEPENDENT_AMBULATORY_CARE_PROVIDER_SITE_OTHER): Payer: Medicare Other

## 2022-07-23 DIAGNOSIS — Z5181 Encounter for therapeutic drug level monitoring: Secondary | ICD-10-CM

## 2022-07-23 LAB — POCT INR: INR: 2.2 (ref 2.0–3.0)

## 2022-07-23 NOTE — Progress Notes (Signed)
This encounter was created in error - please disregard.

## 2022-07-23 NOTE — Patient Instructions (Signed)
Description   Spoke with pt and instructed to continue taking warfarin 1 tablet daily except 1/2 on Sundays.  Recheck INR in 2 weeks [facility] [phone]       

## 2022-08-07 ENCOUNTER — Telehealth: Payer: Self-pay

## 2022-08-07 NOTE — Telephone Encounter (Signed)
Lpm to check INR 

## 2022-08-14 ENCOUNTER — Telehealth: Payer: Self-pay | Admitting: *Deleted

## 2022-08-14 LAB — POCT INR: INR: 2.8 (ref 2.0–3.0)

## 2022-08-14 NOTE — Telephone Encounter (Signed)
Called pt since he is overdue for INR monitoring; there was no answer therefore, left a message for him to call back regarding this.

## 2022-08-15 ENCOUNTER — Ambulatory Visit (INDEPENDENT_AMBULATORY_CARE_PROVIDER_SITE_OTHER): Payer: Medicare Other

## 2022-08-15 DIAGNOSIS — Z952 Presence of prosthetic heart valve: Secondary | ICD-10-CM

## 2022-08-15 DIAGNOSIS — Z7901 Long term (current) use of anticoagulants: Secondary | ICD-10-CM

## 2022-08-15 NOTE — Patient Instructions (Signed)
Description   Spoke with pt and instructed to continue taking warfarin 1 tablet daily except 1/2 on Sundays.  Recheck INR in 2 weeks [facility] [phone]

## 2022-08-28 ENCOUNTER — Telehealth: Payer: Self-pay

## 2022-08-28 NOTE — Telephone Encounter (Signed)
Lpm to check INR 

## 2022-09-03 ENCOUNTER — Ambulatory Visit (INDEPENDENT_AMBULATORY_CARE_PROVIDER_SITE_OTHER): Payer: Medicare Other | Admitting: *Deleted

## 2022-09-03 ENCOUNTER — Telehealth: Payer: Self-pay

## 2022-09-03 DIAGNOSIS — Z952 Presence of prosthetic heart valve: Secondary | ICD-10-CM

## 2022-09-03 DIAGNOSIS — Z7901 Long term (current) use of anticoagulants: Secondary | ICD-10-CM | POA: Diagnosis not present

## 2022-09-03 LAB — POCT INR: INR: 2 (ref 2.0–3.0)

## 2022-09-03 NOTE — Patient Instructions (Signed)
Description   Spoke with pt and instructed to continue taking warfarin 1 tablet daily except 1/2 tablet on Sundays. Recheck INR in 2 weeks. Coumadin Clinic [phone]

## 2022-09-03 NOTE — Telephone Encounter (Signed)
Pt overdue to check INR. Called pt, no answer. Left message on voicemail.

## 2022-09-21 ENCOUNTER — Telehealth: Payer: Self-pay

## 2022-09-21 NOTE — Telephone Encounter (Signed)
Lm to check INR

## 2022-09-24 ENCOUNTER — Telehealth: Payer: Self-pay | Admitting: *Deleted

## 2022-09-24 NOTE — Telephone Encounter (Signed)
Called pt since he is overdue for Anticoagulation Monitoring; there was no answer therefore, left another message to call back regarding overdue INR. Will await a call back.

## 2022-09-25 ENCOUNTER — Ambulatory Visit (INDEPENDENT_AMBULATORY_CARE_PROVIDER_SITE_OTHER): Payer: Medicare Other | Admitting: *Deleted

## 2022-09-25 DIAGNOSIS — Z7901 Long term (current) use of anticoagulants: Secondary | ICD-10-CM | POA: Diagnosis not present

## 2022-09-25 DIAGNOSIS — Z952 Presence of prosthetic heart valve: Secondary | ICD-10-CM

## 2022-09-25 LAB — POCT INR: INR: 2.1 (ref 2.0–3.0)

## 2022-09-25 NOTE — Patient Instructions (Signed)
Description   Spoke with pt and instructed to continue taking warfarin 1 tablet daily except 1/2 tablet on Sundays. Recheck INR in 2 weeks. Coumadin Clinic [phone]

## 2022-10-10 ENCOUNTER — Ambulatory Visit (INDEPENDENT_AMBULATORY_CARE_PROVIDER_SITE_OTHER): Payer: Medicare Other

## 2022-10-10 DIAGNOSIS — Z952 Presence of prosthetic heart valve: Secondary | ICD-10-CM

## 2022-10-10 DIAGNOSIS — Z5181 Encounter for therapeutic drug level monitoring: Secondary | ICD-10-CM

## 2022-10-10 LAB — POCT INR: INR: 3 (ref 2.0–3.0)

## 2022-10-10 MED ORDER — WARFARIN SODIUM 5 MG PO TABS: ORAL_TABLET | ORAL | 0 refills | Status: DC

## 2022-10-10 NOTE — Patient Instructions (Signed)
Description   Spoke with pt and instructed to only take 0.5 tablet today and then continue taking warfarin 1 tablet daily except 1/2 tablet on Sundays. Recheck INR in 2 weeks.  Coumadin Clinic [phone]

## 2022-10-25 ENCOUNTER — Telehealth: Payer: Self-pay | Admitting: *Deleted

## 2022-10-25 NOTE — Telephone Encounter (Signed)
Called pt since INR was due on yesterday. There was no answer therefore, left a message for him to call back regarding this.

## 2022-10-30 ENCOUNTER — Telehealth: Payer: Self-pay

## 2022-10-30 ENCOUNTER — Telehealth: Payer: Self-pay | Admitting: *Deleted

## 2022-10-30 NOTE — Telephone Encounter (Signed)
Left a message to call back in reference to performing INR result since it is overdue and we have been trying to reach him since last week.

## 2022-10-30 NOTE — Telephone Encounter (Signed)
Lpm to check INR 

## 2022-10-31 ENCOUNTER — Telehealth: Payer: Self-pay | Admitting: *Deleted

## 2022-10-31 ENCOUNTER — Ambulatory Visit (INDEPENDENT_AMBULATORY_CARE_PROVIDER_SITE_OTHER): Payer: Medicare Other | Admitting: *Deleted

## 2022-10-31 DIAGNOSIS — Z7901 Long term (current) use of anticoagulants: Secondary | ICD-10-CM | POA: Diagnosis not present

## 2022-10-31 DIAGNOSIS — Z952 Presence of prosthetic heart valve: Secondary | ICD-10-CM

## 2022-10-31 LAB — POCT INR: INR: 2.2 (ref 2.0–3.0)

## 2022-10-31 NOTE — Patient Instructions (Addendum)
  Description   Spoke with pt and instructed to continue taking warfarin 1 tablet daily except 1/2 tablet on Sundays. Recheck INR in 2 weeks.  Coumadin Clinic [phone]

## 2022-10-31 NOTE — Telephone Encounter (Signed)
Spoke with pt and advised INR is overdue and we need the INR result. He stated he would check it now.

## 2022-11-14 ENCOUNTER — Telehealth: Payer: Self-pay | Admitting: *Deleted

## 2022-11-14 ENCOUNTER — Ambulatory Visit (INDEPENDENT_AMBULATORY_CARE_PROVIDER_SITE_OTHER): Payer: Medicare Other | Admitting: *Deleted

## 2022-11-14 DIAGNOSIS — Z952 Presence of prosthetic heart valve: Secondary | ICD-10-CM

## 2022-11-14 DIAGNOSIS — Z7901 Long term (current) use of anticoagulants: Secondary | ICD-10-CM

## 2022-11-14 LAB — POCT INR: INR: 2.3 (ref 2.0–3.0)

## 2022-11-14 NOTE — Telephone Encounter (Signed)
Called pt since he is due to have done; there was no answer therefore left a message to call back. Will await.

## 2022-11-14 NOTE — Patient Instructions (Signed)
Description   Spoke with pt and instructed to continue taking warfarin 1 tablet daily except 1/2 tablet on Sundays. Recheck INR in 2 weeks.  Coumadin Clinic [phone]

## 2022-11-29 ENCOUNTER — Ambulatory Visit (INDEPENDENT_AMBULATORY_CARE_PROVIDER_SITE_OTHER): Payer: Medicare Other

## 2022-11-29 ENCOUNTER — Telehealth: Payer: Self-pay

## 2022-11-29 DIAGNOSIS — Z5181 Encounter for therapeutic drug level monitoring: Secondary | ICD-10-CM

## 2022-11-29 LAB — POCT INR: INR: 2.5 (ref 2.0–3.0)

## 2022-11-29 NOTE — Patient Instructions (Signed)
Description   Spoke with pt and instructed to continue taking warfarin 1 tablet daily except 1/2 tablet on Sundays. Recheck INR in 2 weeks.  Coumadin Clinic [phone]

## 2022-11-29 NOTE — Telephone Encounter (Signed)
INR overdue. Called pt, no answer. Left message on voicemail.

## 2022-12-13 ENCOUNTER — Telehealth: Payer: Self-pay | Admitting: *Deleted

## 2022-12-13 LAB — POCT INR: INR: 2.7 (ref 2.0–3.0)

## 2022-12-13 NOTE — Telephone Encounter (Signed)
Called pt since INR is due. There was no answer therefore left a message to obtain an INR and call back.

## 2022-12-14 ENCOUNTER — Ambulatory Visit: Payer: Self-pay | Admitting: *Deleted

## 2022-12-14 DIAGNOSIS — Z952 Presence of prosthetic heart valve: Secondary | ICD-10-CM

## 2022-12-14 DIAGNOSIS — Z7901 Long term (current) use of anticoagulants: Secondary | ICD-10-CM

## 2022-12-14 NOTE — Patient Instructions (Signed)
Description   Spoke with pt and instructed since you took 1/2 tablet yesterday then continue taking warfarin 1 tablet daily except 1/2 tablet on Sundays. Recheck INR in 2 weeks.  Coumadin Clinic [phone]

## 2022-12-25 ENCOUNTER — Ambulatory Visit (INDEPENDENT_AMBULATORY_CARE_PROVIDER_SITE_OTHER): Payer: Medicare Other | Admitting: *Deleted

## 2022-12-25 DIAGNOSIS — Z7901 Long term (current) use of anticoagulants: Secondary | ICD-10-CM | POA: Diagnosis not present

## 2022-12-25 DIAGNOSIS — Z952 Presence of prosthetic heart valve: Secondary | ICD-10-CM

## 2022-12-25 LAB — POCT INR: INR: 2 (ref 2.0–3.0)

## 2022-12-26 ENCOUNTER — Other Ambulatory Visit: Payer: Self-pay

## 2022-12-26 ENCOUNTER — Ambulatory Visit (HOSPITAL_COMMUNITY)
Admission: EM | Admit: 2022-12-26 | Discharge: 2022-12-26 | Disposition: A | Discharge status: Home / Self Care | Payer: Medicare Other | Attending: Emergency Medicine | Admitting: Emergency Medicine

## 2022-12-26 ENCOUNTER — Encounter (HOSPITAL_COMMUNITY): Payer: Self-pay | Admitting: Emergency Medicine

## 2022-12-26 DIAGNOSIS — H60331 Swimmer's ear, right ear: Secondary | ICD-10-CM | POA: Diagnosis not present

## 2022-12-26 DIAGNOSIS — J029 Acute pharyngitis, unspecified: Secondary | ICD-10-CM

## 2022-12-26 DIAGNOSIS — J302 Other seasonal allergic rhinitis: Secondary | ICD-10-CM

## 2022-12-26 DIAGNOSIS — J0120 Acute ethmoidal sinusitis, unspecified: Secondary | ICD-10-CM | POA: Diagnosis not present

## 2022-12-26 MED ORDER — CETIRIZINE HCL 10 MG PO TABS: 10.0000 mg | ORAL_TABLET | Freq: Every day | ORAL | 0 refills | Status: AC

## 2022-12-26 MED ORDER — CIPROFLOXACIN-DEXAMETHASONE 0.3-0.1 % OT SUSP: 4.0000 [drp] | Freq: Two times a day (BID) | OTIC | 0 refills | Status: AC

## 2022-12-26 MED ORDER — AMOXICILLIN-POT CLAVULANATE 875-125 MG PO TABS: 1.0000 | ORAL_TABLET | Freq: Two times a day (BID) | ORAL | 0 refills | Status: AC

## 2022-12-26 MED ORDER — METHYLPREDNISOLONE SODIUM SUCC 125 MG IJ SOLR
60.0000 mg | Freq: Once | INTRAMUSCULAR | Status: AC
  Administered 2022-12-26: 60 mg via INTRAMUSCULAR

## 2022-12-26 MED ORDER — METHYLPREDNISOLONE SODIUM SUCC 125 MG IJ SOLR
INTRAMUSCULAR | Status: AC
  Filled 2022-12-26: qty 2

## 2022-12-26 NOTE — ED Provider Notes (Signed)
[facility]    CSN: [id number] Arrival date & time: 12/26/22  1020      History   Chief Complaint Chief Complaint  Patient presents with   URI   Otalgia    HPI Max Ward is a 52 y.o. male.   Reports ongoing right ear pain, over a month.  Earlier this week he was sleeping and woke to purulent drainage from his right ear on his pillow.  Reports fullness.  History of external ear infections.  Also reports ongoing right-sided sinus pain and pressure over the past month.  Was referred to an ENT by previous provider months ago, is trying to follow back up with this. Hx of smoking 1 PPD, but has been cutting down.     The history is provided by the patient and medical records.  URI Presenting symptoms: congestion, ear pain and sore throat   Presenting symptoms: no cough and no fever   Associated symptoms: headaches and sinus pain   Otalgia Associated symptoms: congestion, ear discharge, headaches and sore throat   Associated symptoms: no abdominal pain, no cough and no fever     Past Medical History:  Diagnosis Date   Anal fissure    Anemia    Anxiety    Arthritis    Asthma    Bipolar 1 disorder ([place])    Complication of anesthesia    woke up during colonoscoy   DDD (degenerative disc disease), cervical    Depression    DJD (degenerative joint disease)    GERD (gastroesophageal reflux disease)    HLD (hyperlipidemia)    Hypertension    SVT (supraventricular tachycardia)    s/p prior ablation by Dr Seabron Spates at [facility]    Patient Active Problem List   Diagnosis Date Noted   Chronic constipation 02/03/2021   Otitis externa 01/05/2020   Irritable bowel syndrome with constipation 10/29/2019   Generalized abdominal pain 10/29/2019   Hospital discharge follow-up 09/08/2019   Ganglion cyst of volar aspect of right wrist 09/03/2018   Moderate persistent asthma 08/05/2018   PVC (premature ventricular contraction) 07/14/2018   SOB (shortness of  breath) 07/14/2018   Finger pain, right 06/24/2018   Pain in right elbow 06/24/2018   Pain in right wrist 06/24/2018   Ganglion cyst 12/19/2017   Finger injury, right, sequela 12/19/2017   Palpitations 08/29/2017   Dental caries 06/27/2017   Chronic pain of right knee 03/13/2017   Abnormal coagulation profile 07/11/2016   Dysfunction of both eustachian tubes 07/11/2016   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 07/11/2016   Right ear pain 07/11/2016   Mixed hyperlipidemia 02/21/2016   Seasonal allergic rhinitis due to pollen 02/21/2016   Encounter for therapeutic drug monitoring 02/16/2016   Common migraine 02/15/2016   High risk medication use 02/15/2016   History of traumatic head injury 02/15/2016   Low back pain 02/15/2016   Neck pain 02/15/2016   Status post mechanical aortic valve replacement 11/25/2015   ADHD (attention deficit hyperactivity disorder) 10/18/2015   Allergic rhinitis 10/18/2015   Anemia 10/18/2015   Asthma 10/18/2015   Gastroesophageal reflux disease 10/18/2015   Herniated nucleus pulposus, L4-5 10/18/2015   Insomnia 10/18/2015   Pain syndrome, chronic 10/18/2015   SVT (supraventricular tachycardia) 10/18/2015   Essential hypertension 05/30/2015   Chronic anticoagulation 05/30/2015   Postural dizziness with presyncope 05/30/2015   Bipolar affective disorder ([place]) 08/09/2011   Chest pain 08/09/2011   Anxiety 08/09/2011   Depressed affect 08/09/2011   Permanent  form of junctional reciprocating tachycardia 08/09/2011   Tobacco use 08/09/2011    Past Surgical History:  Procedure Laterality Date   BONE TUMOR EXCISION     skull   CARDIAC CATHETERIZATION  2018   [facility] reveals normal cors   GANGLION CYST EXCISION Right 09/03/2018   Procedure: REMOVAL GANGLION OF RIGHT WRIST AND RIGHT ELBOW;  Surgeon: Leandrew Koyanagi, MD;  Location: [facility];  Service: Orthopedics;  Laterality: Right;  OTHER SITE IS ELBOW   MECHANICAL AORTIC VALVE REPLACEMENT          Home Medications    Prior to Admission medications   Medication Sig Start Date End Date Taking? Authorizing Provider  amoxicillin-clavulanate (AUGMENTIN) 875-125 MG tablet Take 1 tablet by mouth every 12 (twelve) hours. 12/26/22  Yes Louretta Shorten, [place], FNP  ciprofloxacin-dexamethasone (CIPRODEX) OTIC suspension Place 4 drops into the right ear 2 (two) times daily for 7 days. 12/26/22 01/02/23 Yes Monifa Blanchette, [place], FNP  ADVAIR DISKUS 250-50 MCG/DOSE AEPB INHALE 1 PUFF BY MOUTH TWICE A DAY 08/04/19   Margaretha Seeds, MD  albuterol (VENTOLIN HFA) 108 (90 Base) MCG/ACT inhaler INHALE 1-2 PUFFS BY MOUTH EVERY 4-6 HOURS AS NEEDED AND AS DIRECTED 09/05/20   Kennyth Arnold, FNP  alprazolam Duanne Moron) 2 MG tablet Take 2 mg by mouth 3 (three) times daily as needed for sleep.    [provider]  amphetamine-dextroamphetamine (ADDERALL XR) 25 MG 24 hr capsule Take 1 capsule by mouth 2 (two) times daily. Patient not taking: Reported on 12/26/2022 08/05/19   [provider]  Asenapine Maleate 10 MG SUBL Place 30 mg under the tongue at bedtime.    [provider]  azelastine (ASTELIN) 0.1 % nasal spray Place 1 spray into both nostrils 2 (two) times daily. Use in each nostril as directed    [provider]  cetirizine (ZYRTEC) 10 MG tablet Take 1 tablet (10 mg total) by mouth daily. Additional refills from pcp: Dr.Kremer 12/26/22 01/25/23  [place] Riggle, [place], FNP  dicyclomine (BENTYL) 10 MG capsule TAKE 1 CAPSULE BY MOUTH 2 TIMES DAILY. 08/07/21   Zehr, Laban Emperor, PA-C  esomeprazole (NEXIUM) 40 MG capsule Take 1 capsule (40 mg total) by mouth daily at 12 noon. 10/29/19   Zehr, Laban Emperor, PA-C  fluticasone (FLONASE) 50 MCG/ACT nasal spray Place 1 spray into both nostrils daily. 10/31/18   Margaretha Seeds, MD  hydrOXYzine (VISTARIL) 50 MG capsule Take 1 capsule by mouth as needed for anxiety. 01/01/19   [provider]  linaclotide Rolan Lipa) 290 MCG CAPS  capsule Take 1 capsule (290 mcg total) by mouth daily before breakfast. Patient not taking: Reported on 02/27/2021 02/02/21   Zehr, Janett Billow D, PA-C  metoprolol succinate (TOPROL-XL) 50 MG 24 hr tablet Take 50 mg by mouth daily. Take with or immediately following a meal.    [provider]  montelukast (SINGULAIR) 10 MG tablet TAKE 1 TABLET BY MOUTH EVERY DAY Patient not taking: Reported on 12/26/2022 07/06/19   Margaretha Seeds, MD  traZODone (DESYREL) 100 MG tablet Take 300 mg by mouth at bedtime. 09/11/19   [provider]  warfarin (COUMADIN) 5 MG tablet Take 1/2 tablet to 1 tablet by mouth daily as directed by the coumadin clinic. 10/10/22   Elouise Munroe, MD    Family History Family History  Problem Relation Age of Onset   Lung cancer Mother    Emphysema Father    Dementia Father  COPD Father    Esophageal cancer Maternal Grandfather    Diabetes Paternal Grandmother    Brain cancer Maternal Aunt    Colon cancer Maternal Uncle    Cancer Paternal Uncle    Stomach cancer Neg Hx     Social History Social History   Tobacco Use   Smoking status: Every Day    Packs/day: 1.00    Years: 15.00    Total pack years: 15.00    Types: Cigarettes   Smokeless tobacco: Never   Tobacco comments:    1/2 pack  or less per day Jan 2020, just "puffs" on cigars now  Vaping Use   Vaping Use: Former  Substance Use Topics   Alcohol use: Yes    Comment: 1-3 beers on occ.    Drug use: No     Allergies   Molds & smuts, Valium [diazepam], and Vicodin [hydrocodone-acetaminophen]   Review of Systems Review of Systems  Constitutional:  Negative for fever.  HENT:  Positive for congestion, ear discharge, ear pain, postnasal drip, sinus pressure, sinus pain and sore throat.   Eyes:  Negative for discharge.  Respiratory:  Negative for cough and shortness of breath.   Cardiovascular:  Negative for chest pain.  Gastrointestinal:  Negative for abdominal distention and  abdominal pain.  Genitourinary:  Negative for dysuria.  Neurological:  Positive for headaches. Negative for seizures.     Physical Exam Triage Vital Signs ED Triage Vitals  Enc Vitals Group     BP 12/26/22 1209 (!) 152/108     Pulse Rate 12/26/22 1209 91     Resp 12/26/22 1209 20     Temp 12/26/22 1209 97.8 F (36.6 C)     Temp Source 12/26/22 1209 Oral     SpO2 12/26/22 1209 94 %     Weight --      Height --      Head Circumference --      Peak Flow --      Pain Score 12/26/22 1206 4     Pain Loc --      Pain Edu? --      Excl. in Page? --    No data found.  Updated Vital Signs BP (!) 134/97 (BP Location: Right Arm)   Pulse 91   Temp 97.8 F (36.6 C) (Oral)   Resp 20   SpO2 94%   Visual Acuity Right Eye Distance:   Left Eye Distance:   Bilateral Distance:    Right Eye Near:   Left Eye Near:    Bilateral Near:     Physical Exam Vitals and nursing note reviewed.  Constitutional:      General: He is not in acute distress.    Appearance: He is well-developed.  HENT:     Head: Normocephalic and atraumatic.     Right Ear: Tympanic membrane normal. Drainage, swelling and tenderness present. There is no impacted cerumen. No foreign body.     Left Ear: Tympanic membrane normal. No drainage, swelling or tenderness. There is no impacted cerumen. No foreign body.     Nose: Congestion and rhinorrhea present.     Right Turbinates: Enlarged.     Left Turbinates: Enlarged.     Right Sinus: Maxillary sinus tenderness and frontal sinus tenderness present.     Mouth/Throat:     Mouth: Mucous membranes are moist.     Pharynx: Posterior oropharyngeal erythema present. No oropharyngeal exudate.  Eyes:     General: No scleral icterus.  Right eye: No discharge.        Left eye: No discharge.     Extraocular Movements: Extraocular movements intact.     Conjunctiva/sclera: Conjunctivae normal.     Pupils: Pupils are equal, round, and reactive to light.  Cardiovascular:      Rate and Rhythm: Normal rate and regular rhythm.     Pulses: Normal pulses.  Pulmonary:     Effort: Pulmonary effort is normal. No respiratory distress.     Breath sounds: Normal breath sounds.  Musculoskeletal:        General: No swelling. Normal range of motion.     Cervical back: Normal range of motion and neck supple.  Skin:    General: Skin is warm and dry.     Capillary Refill: Capillary refill takes less than 2 seconds.  Neurological:     Mental Status: He is alert.  Psychiatric:        Mood and Affect: Mood normal.        Behavior: Behavior is cooperative.      UC Treatments / Results  Labs (all labs ordered are listed, but only abnormal results are displayed) Labs Reviewed - No data to display  EKG   Radiology No results found.  Procedures Procedures (including critical care time)  Medications Ordered in UC Medications  methylPREDNISolone sodium succinate (SOLU-MEDROL) 125 mg/2 mL injection 60 mg (60 mg Intramuscular Given 12/26/22 1227)    Initial Impression / Assessment and Plan / UC Course  I have reviewed the triage vital signs and the nursing notes.  Pertinent labs & imaging results that were available during my care of the patient were reviewed by me and considered in my medical decision making (see chart for details).  Vitals and triage reviewed, patient is hemodynamically stable.  Right tragus manipulation eliciting pain, external ear canal with erythema and purulent drainage.  Tympanic membrane intact, without bulging or erythema.  Reports severe pain, will give IM steroid injection to help with inflammation and pain. External ear infection with intact tympanic membrane, will cover with Ciprodex.  Right sinus pain and pressure, tenderness to maxillary and frontal sinuses on palpation, will cover with Augmentin, has been persistent for a month.  Discussed need for follow-up with ENT.  Advised to restart on antihistamines.  Without red flag symptoms for  periorbital cellulitis, return and follow-up precautions discussed, patient verbalized understanding.    Final Clinical Impressions(s) / UC Diagnoses   Final diagnoses:  Acute swimmer's ear of right side  Acute ethmoidal sinusitis, recurrence not specified     Discharge Instructions      You have an external ear infection, please apply the antibiotic drops twice daily for the next 7 days.  He also appear to have a sinus infection as well, please take all antibiotics as prescribed and until finished.  Please also restart on your antihistamine.  If you do not hear back from your ENT, please reach out and schedule an appointment with Radene Journey.  Please return to clinic or seek immediate care if your symptoms worsen, you develop high fever despite medication, or no improvement despite antibiotics.     ED Prescriptions     Medication Sig Dispense Auth. Provider   amoxicillin-clavulanate (AUGMENTIN) 875-125 MG tablet Take 1 tablet by mouth every 12 (twelve) hours. 14 tablet [place], [place], [place]   ciprofloxacin-dexamethasone (CIPRODEX) OTIC suspension Place 4 drops into the right ear 2 (two) times daily for 7 days. 2.8 mL Garland Smouse, [place], FNP  cetirizine (ZYRTEC) 10 MG tablet Take 1 tablet (10 mg total) by mouth daily. Additional refills from pcp: Dr.Kremer 30 tablet Meir [place], [place], [place]      I have reviewed the PDMP during this encounter.   Kayla Weekes, [place], [place] 12/26/22 [phone]

## 2022-12-26 NOTE — ED Triage Notes (Addendum)
Right ear pain and pressure for the past 2-3 weeks.    Left ear hurting last night.    Patient reports minimal cough, sinus drainage in throat.    Patient has used ear drops in ears-but cannot remember the name .  Patient has been using otc sinus medicines.    Patient reports his PCP has retired.

## 2022-12-26 NOTE — Discharge Instructions (Addendum)
You have an external ear infection, please apply the antibiotic drops twice daily for the next 7 days.  He also appear to have a sinus infection as well, please take all antibiotics as prescribed and until finished.  Please also restart on your antihistamine.  If you do not hear back from your ENT, please reach out and schedule an appointment with Radene Journey.  Please return to clinic or seek immediate care if your symptoms worsen, you develop high fever despite medication, or no improvement despite antibiotics.

## 2023-01-10 ENCOUNTER — Ambulatory Visit: Payer: Medicare Other | Admitting: Family Medicine

## 2023-01-11 ENCOUNTER — Telehealth: Payer: Self-pay | Admitting: *Deleted

## 2023-01-11 NOTE — Telephone Encounter (Signed)
Pt due to have INR checked. Called pt and LMOM to call anticoagulation clinic back.

## 2023-01-13 LAB — POCT INR: INR: 2.9 (ref 2.0–3.0)

## 2023-01-14 ENCOUNTER — Ambulatory Visit (INDEPENDENT_AMBULATORY_CARE_PROVIDER_SITE_OTHER): Payer: Medicare Other | Admitting: *Deleted

## 2023-01-14 DIAGNOSIS — Z7901 Long term (current) use of anticoagulants: Secondary | ICD-10-CM

## 2023-01-14 DIAGNOSIS — Z952 Presence of prosthetic heart valve: Secondary | ICD-10-CM | POA: Diagnosis not present

## 2023-01-14 NOTE — Patient Instructions (Signed)
Description   Spoke with pt and instructed pt to take 1/2 tablet of warfarin today then continue taking warfarin 1 tablet daily except 1/2 tablet on Sundays. Recheck INR in 2 weeks.  [facility] Clinic [phone]

## 2023-01-20 ENCOUNTER — Other Ambulatory Visit: Payer: Self-pay | Admitting: Internal Medicine

## 2023-01-20 DIAGNOSIS — Z952 Presence of prosthetic heart valve: Secondary | ICD-10-CM

## 2023-01-28 ENCOUNTER — Telehealth: Payer: Self-pay | Admitting: *Deleted

## 2023-01-28 NOTE — Telephone Encounter (Signed)
Called pt since INR is due, there was no answer therefore left a voicemail to perform home monitoring and call back as soon as he can. Will await.

## 2023-01-29 ENCOUNTER — Telehealth: Payer: Self-pay | Admitting: *Deleted

## 2023-01-29 NOTE — Telephone Encounter (Signed)
Called pt since his INR was due on 01/28/23; there was no answer again. Therefore, left a message for the pt to call back. Will back.

## 2023-01-30 ENCOUNTER — Telehealth: Payer: Self-pay | Admitting: *Deleted

## 2023-01-30 NOTE — Telephone Encounter (Signed)
Called pt since INR is overdue; left another voicemail for the pt to call back soon. Will await.

## 2023-02-01 ENCOUNTER — Ambulatory Visit (INDEPENDENT_AMBULATORY_CARE_PROVIDER_SITE_OTHER): Payer: Medicare Other | Admitting: Cardiology

## 2023-02-01 DIAGNOSIS — Z952 Presence of prosthetic heart valve: Secondary | ICD-10-CM

## 2023-02-01 DIAGNOSIS — Z7901 Long term (current) use of anticoagulants: Secondary | ICD-10-CM

## 2023-02-01 DIAGNOSIS — Z5181 Encounter for therapeutic drug level monitoring: Secondary | ICD-10-CM | POA: Diagnosis not present

## 2023-02-01 LAB — POCT INR: INR: 2.3 (ref 2.0–3.0)

## 2023-02-01 NOTE — Patient Instructions (Signed)
Description   Spoke with pt and instructed pt to continue taking warfarin 1 tablet daily except 1/2 tablet on Sundays.  Recheck INR in 2 weeks.  [facility] [phone]

## 2023-02-19 ENCOUNTER — Other Ambulatory Visit: Payer: Self-pay | Admitting: Internal Medicine

## 2023-02-19 ENCOUNTER — Telehealth: Payer: Self-pay | Admitting: Internal Medicine

## 2023-02-19 ENCOUNTER — Ambulatory Visit (INDEPENDENT_AMBULATORY_CARE_PROVIDER_SITE_OTHER): Payer: Medicare Other | Admitting: *Deleted

## 2023-02-19 ENCOUNTER — Telehealth: Payer: Self-pay | Admitting: *Deleted

## 2023-02-19 DIAGNOSIS — Z952 Presence of prosthetic heart valve: Secondary | ICD-10-CM | POA: Diagnosis not present

## 2023-02-19 DIAGNOSIS — Z7901 Long term (current) use of anticoagulants: Secondary | ICD-10-CM

## 2023-02-19 LAB — POCT INR: INR: 2.3 (ref 2.0–3.0)

## 2023-02-19 MED ORDER — WARFARIN SODIUM 5 MG PO TABS: ORAL_TABLET | ORAL | 0 refills | Status: DC

## 2023-02-19 NOTE — Telephone Encounter (Signed)
Called pt since INR is overdue; there was no answer therefore, left a message for him to call back regarding this. Will await.

## 2023-02-19 NOTE — Telephone Encounter (Signed)
*  STAT* If patient is at the pharmacy, call can be transferred to refill team.   1. Which medications need to be refilled? (please list name of each medication and dose if known)  warfarin (COUMADIN) 5 MG tablet  metoprolol succinate (TOPROL-XL) 50 MG 24 hr tablet    2. Which pharmacy/location (including street and city if local pharmacy) is medication to be sent to?  [id number] - [place], [place] - [address]    3. Do they need a 30 day or 90 day supply? 90

## 2023-02-19 NOTE — Telephone Encounter (Signed)
Left message to call back  

## 2023-02-19 NOTE — Telephone Encounter (Signed)
Warfarin 5mg  refill Status post mechanical aortic valve replacement  Last INR today 02/19/23 Last OV 02/01/22 & has an appt pending on 07/19/23 with Dr Jacques Navy

## 2023-02-19 NOTE — Telephone Encounter (Signed)
Patient is requesting call back to discuss tests he believes he needs to have scheduled before his next appt with Dr. Jacques Navy.

## 2023-02-19 NOTE — Patient Instructions (Signed)
Description   Spoke with pt and instructed pt to continue taking warfarin 1 tablet daily except 1/2 tablet on Sundays.  Recheck INR in 2 weeks.  [facility]in Clinic [phone]      

## 2023-02-20 MED ORDER — METOPROLOL SUCCINATE ER 50 MG PO TB24: 50.0000 mg | ORAL_TABLET | Freq: Every day | ORAL | 1 refills | Status: DC

## 2023-02-21 NOTE — Telephone Encounter (Signed)
LVM to call office.

## 2023-02-22 NOTE — Telephone Encounter (Signed)
Patient states not having labs done "in a while" or Ultrasound.  He states usually has one yearly. And been 6 years since heart cath.  He has been having a bad diet and his cholesterol is high since covid.  States cannot take cholesterol meds as makes him so sick.  Has dental issues as well but his dentis wont do anything until he is cleared.  Discusses ear infection. Dental infection, other issues.  He has October appt with Dr Jacques Navy, saw her last in 2022.  He is scheduled with . [facility] for 5/31 and appt with Dr Jacques Navy left on in case he needs the follow up

## 2023-03-05 ENCOUNTER — Telehealth: Payer: Self-pay | Admitting: *Deleted

## 2023-03-05 ENCOUNTER — Ambulatory Visit (INDEPENDENT_AMBULATORY_CARE_PROVIDER_SITE_OTHER): Payer: Medicare Other | Admitting: Cardiology

## 2023-03-05 DIAGNOSIS — Z7901 Long term (current) use of anticoagulants: Secondary | ICD-10-CM

## 2023-03-05 DIAGNOSIS — Z952 Presence of prosthetic heart valve: Secondary | ICD-10-CM | POA: Diagnosis not present

## 2023-03-05 LAB — POCT INR: INR: 3.3 — AB (ref 2.0–3.0)

## 2023-03-05 NOTE — Telephone Encounter (Signed)
Called pt since INR is due, there was no answer. Left a message for him to call back. Will await and follow up.

## 2023-03-15 ENCOUNTER — Ambulatory Visit: Payer: Medicare Other | Attending: Nurse Practitioner | Admitting: Nurse Practitioner

## 2023-03-15 NOTE — Progress Notes (Deleted)
Office Visit    Patient Name: Max Ward [facility] Date of Encounter: 03/15/2023  Primary Care Provider:  Lester Cerrillos Hoyos., MD Primary Cardiologist:  Parke Poisson, MD  Chief Complaint    52 year old male with a history of nonobstructive CAD, bicuspid valve s/p mechanical AVR in 2014 on Coumadin, hypertension, hyperlipidemia, bipolar disorder, and GERD who presents for follow-up related to CAD and aortic Insufficiency.   Past Medical History    Past Medical History:  Diagnosis Date   Anal fissure    Anemia    Anxiety    Arthritis    Asthma    Bipolar 1 disorder (HCC)    Complication of anesthesia    woke up during colonoscoy   DDD (degenerative disc disease), cervical    Depression    DJD (degenerative joint disease)    GERD (gastroesophageal reflux disease)    HLD (hyperlipidemia)    Hypertension    SVT (supraventricular tachycardia)    s/p prior ablation by Dr Hollace Kinnier at [facility] for PJRT   Past Surgical History:  Procedure Laterality Date   BONE TUMOR EXCISION     skull   CARDIAC CATHETERIZATION  2018   [facility] reveals normal cors   GANGLION CYST EXCISION Right 09/03/2018   Procedure: REMOVAL GANGLION OF RIGHT WRIST AND RIGHT ELBOW;  Surgeon: Tarry Kos, MD;  Location: [facility];  Service: Orthopedics;  Laterality: Right;  OTHER SITE IS ELBOW   MECHANICAL AORTIC VALVE REPLACEMENT      Allergies  Allergies  Allergen Reactions   Molds & Smuts Other (See Comments)    Unknown   Valium [Diazepam] Palpitations   Vicodin [Hydrocodone-Acetaminophen] Palpitations     Labs/Other Studies Reviewed    The following studies were reviewed today:  Cardiac Studies & Procedures       ECHOCARDIOGRAM  ECHOCARDIOGRAM COMPLETE 03/03/2021  Narrative ECHOCARDIOGRAM REPORT    Patient Name:   HINTON COTA Date of Exam: 03/03/2021 Medical Rec #:  [id number]          Height:       69.0 in Accession #:    [id number]         Weight:       224.6  lb Date of Birth:  [birth year]          BSA:          2.170 m Patient Age:    49 years           BP:           126/84 mmHg Patient Gender: M                  HR:           65 bpm. Exam Location:  [address]  Procedure: 2D Echo, Cardiac Doppler and Color Doppler  Indications:    Z95.2 S/p AVR  History:        Patient has prior history of Echocardiogram examinations, most recent 12/12/2018. AVR (mechanical), Arrythmias:PVC and Tachycardia; Risk Factors:Hypertension and Dyslipidemia.  Sonographer:    Samule Ohm RDCS Referring Phys: [id number] Lynda Rainwater A ACHARYA  IMPRESSIONS   1. Mechanical AVR NOS. DVI 0.44. No rocking or paravalular AI. Aortic valve regurgitation is not visualized. 2. There is borderline dilatation of the ascending aorta, measuring 38 mm, but within normal limits for age and BSA. 3. Left ventricular ejection fraction, by estimation, is 55 to 60%. The left ventricle has normal function. The left ventricle has  no regional wall motion abnormalities. There is mild concentric left ventricular hypertrophy. Left ventricular diastolic parameters were normal. 4. Right ventricular systolic function is normal. The right ventricular size is normal. 5. The mitral valve is grossly normal. Trivial mitral valve regurgitation. No evidence of mitral stenosis. 6. The inferior vena cava is normal in size with greater than 50% respiratory variability, suggesting right atrial pressure of 3 mmHg.  Comparison(s): A prior study was performed on 12/12/2018. No significant change from prior study. Prior images reviewed side by side. En face imaging better optimized on 2020 study.  FINDINGS Left Ventricle: Left ventricular ejection fraction, by estimation, is 55 to 60%. The left ventricle has normal function. The left ventricle has no regional wall motion abnormalities. The left ventricular internal cavity size was normal in size. There is mild concentric left ventricular hypertrophy. Left  ventricular diastolic parameters were normal.  Right Ventricle: The right ventricular size is normal. No increase in right ventricular wall thickness. Right ventricular systolic function is normal.  Left Atrium: Left atrial size was normal in size.  Right Atrium: Right atrial size was normal in size.  Pericardium: There is no evidence of pericardial effusion.  Mitral Valve: The mitral valve is grossly normal. There is mild thickening of the mitral valve leaflet(s). Mild mitral annular calcification. Trivial mitral valve regurgitation. No evidence of mitral valve stenosis.  Tricuspid Valve: The tricuspid valve is normal in structure. Tricuspid valve regurgitation is not demonstrated. No evidence of tricuspid stenosis.  Aortic Valve: Mechanical AVR NOS. DVI 0.44. No rocking or paravalular AI. The aortic valve has been repaired/replaced. Aortic valve regurgitation is not visualized. Aortic valve mean gradient measures 9.8 mmHg. Aortic valve peak gradient measures 18.5 mmHg. Aortic valve area, by VTI measures 1.53 cm.  Pulmonic Valve: The pulmonic valve was normal in structure. Pulmonic valve regurgitation is not visualized. No evidence of pulmonic stenosis.  Aorta: The aortic root and ascending aorta are structurally normal, with no evidence of dilitation. There is borderline dilatation of the ascending aorta, measuring 38 mm.  Venous: The inferior vena cava is normal in size with greater than 50% respiratory variability, suggesting right atrial pressure of 3 mmHg.  IAS/Shunts: The atrial septum is grossly normal.   LEFT VENTRICLE PLAX 2D LVIDd:         5.70 cm  Diastology LVIDs:         4.00 cm  LV e' medial:    7.51 cm/s LV PW:         1.10 cm  LV E/e' medial:  9.5 LV IVS:        1.10 cm  LV e' lateral:   12.20 cm/s LVOT diam:     2.10 cm  LV E/e' lateral: 5.9 LV SV:         66 LV SV Index:   30 LVOT Area:     3.46 cm   RIGHT VENTRICLE             IVC RV S prime:     12.60  cm/s  IVC diam: 1.30 cm TAPSE (M-mode): 1.8 cm RVSP:           19.0 mmHg  LEFT ATRIUM             Index       RIGHT ATRIUM           Index LA diam:        3.60 cm 1.66 cm/m  RA Pressure: 3.00 mmHg LA Vol (A2C):  69.5 ml 32.02 ml/m RA Area:     19.60 cm LA Vol (A4C):   52.7 ml 24.28 ml/m RA Volume:   59.10 ml  27.23 ml/m LA Biplane Vol: 61.0 ml 28.11 ml/m AORTIC VALVE AV Area (Vmax):    1.50 cm AV Area (Vmean):   1.54 cm AV Area (VTI):     1.53 cm AV Vmax:           215.00 cm/s AV Vmean:          144.600 cm/s AV VTI:            0.429 m AV Peak Grad:      18.5 mmHg AV Mean Grad:      9.8 mmHg LVOT Vmax:         93.25 cm/s LVOT Vmean:        64.300 cm/s LVOT VTI:          0.190 m LVOT/AV VTI ratio: 0.44  AORTA Ao Root diam: 3.60 cm Ao Asc diam:  3.70 cm  MV E velocity: 71.70 cm/s  TRICUSPID VALVE MV A velocity: 64.30 cm/s  TR Peak grad:   16.0 mmHg MV E/A ratio:  1.12        TR Vmax:        200.00 cm/s Estimated RAP:  3.00 mmHg RVSP:           19.0 mmHg  SHUNTS Systemic VTI:  0.19 m Systemic Diam: 2.10 cm  Riley Lam MD Electronically signed by Riley Lam MD Signature Date/Time: 03/03/2021/11:48:52 AM    Final    MONITORS  LONG TERM MONITOR (3-14 DAYS) 05/30/2019  Narrative Indication: PVCs  Minimum HR (bpm): 53 Maximum HR (bpm): 132  Supraventricular Ectopy: 24 beats (<1% total beats) SVT: none  Ventricular Ectopy: [id number] (24.9% total beats) NSVT: none Ventricular Tachycardia: none  Pauses: none AV block: none  Atrial fibrillation: none  Diary events: no symptoms recorded, occasional patient triggered events may temporally correlate to PVCs  Impression Frequent PVCs, 24.9% of total beats.          Recent Labs: No results found for requested labs within last 365 days.  Recent Lipid Panel No results found for: "CHOL", "TRIG", "HDL", "CHOLHDL", "VLDL", "LDLCALC", "LDLDIRECT"  History of Present Illness     52 year old male with the above past medical history including nonobstructive CAD, bicuspid valve s/p mechanical AVR in 2014 on Coumadin, hypertension, hyperlipidemia, bipolar disorder, and GERD.  Nuclear stress test in 2019 in the setting of atypical chest pain revealed inferior ischemia.  Follow-up cardiac catheterization showed no occlusive disease, very mild calcifications.  Most recent echocardiogram in 02/2021 revealed stable mechanical AVR, 70%, normal LV systolic function, no RWMA, mild concentric LVH, normal RV systolic function, borderline dilation of the ascending aorta measuring 38 mm.  Additionally, he has a history of palpitations with high PVC burden, previously evaluated by EP.  He was last seen in the office on 02/01/2022 and was stable from a cardiac standpoint.  He denied symptoms concerning for angina.  He presents today for follow-up.  Since his last visit   Nonobstructive CAD: Aortic insufficiency/bicuspid aortic valve: Hypertension: Hyperlipidemia: Disposition:  Home Medications    Current Outpatient Medications  Medication Sig Dispense Refill   ADVAIR DISKUS 250-50 MCG/DOSE AEPB INHALE 1 PUFF BY MOUTH TWICE A DAY 180 each 1   albuterol (VENTOLIN HFA) 108 (90 Base) MCG/ACT inhaler INHALE 1-2 PUFFS BY MOUTH EVERY 4-6 HOURS AS NEEDED AND AS DIRECTED 18 each 5  alprazolam (XANAX) 2 MG tablet Take 2 mg by mouth 3 (three) times daily as needed for sleep.     amoxicillin-clavulanate (AUGMENTIN) 875-125 MG tablet Take 1 tablet by mouth every 12 (twelve) hours. 14 tablet 0   amphetamine-dextroamphetamine (ADDERALL XR) 25 MG 24 hr capsule Take 1 capsule by mouth 2 (two) times daily. (Patient not taking: Reported on 12/26/2022)     Asenapine Maleate 10 MG SUBL Place 30 mg under the tongue at bedtime.     azelastine (ASTELIN) 0.1 % nasal spray Place 1 spray into both nostrils 2 (two) times daily. Use in each nostril as directed     cetirizine (ZYRTEC) 10 MG tablet Take 1 tablet (10  mg total) by mouth daily. Additional refills from pcp: Dr.Kremer 30 tablet 0   dicyclomine (BENTYL) 10 MG capsule TAKE 1 CAPSULE BY MOUTH 2 TIMES DAILY. 180 capsule 1   esomeprazole (NEXIUM) 40 MG capsule Take 1 capsule (40 mg total) by mouth daily at 12 noon. 30 capsule 5   fluticasone (FLONASE) 50 MCG/ACT nasal spray Place 1 spray into both nostrils daily. 16 g 2   hydrOXYzine (VISTARIL) 50 MG capsule Take 1 capsule by mouth as needed for anxiety.     linaclotide (LINZESS) 290 MCG CAPS capsule Take 1 capsule (290 mcg total) by mouth daily before breakfast. (Patient not taking: Reported on 02/27/2021) 30 capsule 3   metoprolol succinate (TOPROL-XL) 50 MG 24 hr tablet Take 1 tablet (50 mg total) by mouth daily. Take with or immediately following a meal. 30 tablet 1   montelukast (SINGULAIR) 10 MG tablet TAKE 1 TABLET BY MOUTH EVERY DAY (Patient not taking: Reported on 12/26/2022) 90 tablet 0   traZODone (DESYREL) 100 MG tablet Take 300 mg by mouth at bedtime.     warfarin (COUMADIN) 5 MG tablet Take 1/2 tablet to 1 tablet by mouth daily as directed by the coumadin clinic. 90 tablet 0   No current facility-administered medications for this visit.     Review of Systems    ***.  All other systems reviewed and are otherwise negative except as noted above.    Physical Exam    VS:  There were no vitals taken for this visit. , BMI There is no height or weight on file to calculate BMI.     GEN: Well nourished, well developed, in no acute distress. HEENT: normal. Neck: Supple, no JVD, carotid bruits, or masses. Cardiac: RRR, no murmurs, rubs, or gallops. No clubbing, cyanosis, edema.  Radials/DP/PT 2+ and equal bilaterally.  Respiratory:  Respirations regular and unlabored, clear to auscultation bilaterally. GI: Soft, nontender, nondistended, BS + x 4. MS: no deformity or atrophy. Skin: warm and dry, no rash. Neuro:  Strength and sensation are intact. Psych: Normal affect.  Accessory Clinical  Findings    ECG personally reviewed by me today - *** - no acute changes.   Lab Results  Component Value Date   WBC 10.0 09/01/2019   HGB 16.4 09/01/2019   HCT 47.8 09/01/2019   MCV 89.7 09/01/2019   PLT 240 09/01/2019   Lab Results  Component Value Date   CREATININE 1.14 08/31/2019   BUN 11 08/31/2019   NA 136 08/31/2019   K 4.5 08/31/2019   CL 103 08/31/2019   CO2 20 (L) 08/31/2019   Lab Results  Component Value Date   ALT 35 09/01/2019   AST 23 09/01/2019   ALKPHOS 64 09/01/2019   BILITOT 1.5 (H) 09/01/2019   No results  found for: "CHOL", "HDL", "LDLCALC", "LDLDIRECT", "TRIG", "CHOLHDL"  No results found for: "HGBA1C"  Assessment & Plan    1.  ***  No BP recorded.  {Refresh Note OR Click here to enter BP  :1}***   Joylene Grapes, NP 03/15/2023, 7:24 AM

## 2023-03-19 ENCOUNTER — Telehealth: Payer: Self-pay | Admitting: *Deleted

## 2023-03-19 ENCOUNTER — Ambulatory Visit (INDEPENDENT_AMBULATORY_CARE_PROVIDER_SITE_OTHER): Payer: Medicare Other | Admitting: Cardiology

## 2023-03-19 DIAGNOSIS — Z7901 Long term (current) use of anticoagulants: Secondary | ICD-10-CM

## 2023-03-19 DIAGNOSIS — Z952 Presence of prosthetic heart valve: Secondary | ICD-10-CM

## 2023-03-19 LAB — POCT INR: INR: 1.5 — AB (ref 2.0–3.0)

## 2023-03-19 NOTE — Telephone Encounter (Signed)
Called pt since INR is due; he was still in the bed and states he will do now. Will follow up.

## 2023-03-19 NOTE — Patient Instructions (Signed)
Description   Spoke with pt and instructed pt to take 2 tablets of warfarin today then continue taking warfarin 1 tablet daily except 1/2 tablet on Sundays. Recheck INR in 2 weeks.  [facility] [phone]

## 2023-04-03 ENCOUNTER — Telehealth: Payer: Self-pay | Admitting: *Deleted

## 2023-04-03 NOTE — Telephone Encounter (Signed)
Called pt since INR is overdue; there was no answer therefore, left a message.

## 2023-04-04 ENCOUNTER — Telehealth: Payer: Self-pay | Admitting: *Deleted

## 2023-04-04 NOTE — Telephone Encounter (Signed)
Called pt since INR is due there was no answer and left a message.

## 2023-04-05 ENCOUNTER — Telehealth: Payer: Self-pay | Admitting: *Deleted

## 2023-04-05 ENCOUNTER — Ambulatory Visit (INDEPENDENT_AMBULATORY_CARE_PROVIDER_SITE_OTHER): Payer: Medicare Other | Admitting: *Deleted

## 2023-04-05 DIAGNOSIS — Z7901 Long term (current) use of anticoagulants: Secondary | ICD-10-CM | POA: Diagnosis not present

## 2023-04-05 DIAGNOSIS — Z952 Presence of prosthetic heart valve: Secondary | ICD-10-CM

## 2023-04-05 LAB — POCT INR: INR: 4.3 — AB (ref 2.0–3.0)

## 2023-04-05 NOTE — Telephone Encounter (Signed)
Called pt sine INR is overdue; there was no answer again.  We cannot continue to call him and leave messages and he never calls back. Will need to update pt to let him know his responsibilities with having the self testing machine as he should adhere to the timely recommendations of follow up and reporting results.

## 2023-04-05 NOTE — Patient Instructions (Signed)
Description   Spoke with pt and instructed pt to not take any warfarin today and tomorrow then continue taking warfarin 1 tablet daily except 1/2 tablet on Sundays. Recheck INR in 1 week.  [facility] [phone]

## 2023-04-11 ENCOUNTER — Telehealth: Payer: Self-pay | Admitting: *Deleted

## 2023-04-11 NOTE — Telephone Encounter (Signed)
Called pt since INR is due; there was no answer therefore left a message to call back. Will await a call back or follow up.

## 2023-04-12 ENCOUNTER — Ambulatory Visit (INDEPENDENT_AMBULATORY_CARE_PROVIDER_SITE_OTHER): Payer: Medicare Other

## 2023-04-12 ENCOUNTER — Telehealth: Payer: Self-pay

## 2023-04-12 DIAGNOSIS — Z7901 Long term (current) use of anticoagulants: Secondary | ICD-10-CM | POA: Diagnosis not present

## 2023-04-12 DIAGNOSIS — Z952 Presence of prosthetic heart valve: Secondary | ICD-10-CM | POA: Diagnosis not present

## 2023-04-12 LAB — POCT INR: INR: 3.8 — AB (ref 2.0–3.0)

## 2023-04-12 NOTE — Patient Instructions (Signed)
Description   Spoke with pt and instructed pt to not take any warfarin today and eat a salad and then continue taking warfarin 1 tablet daily except 1/2 tablet on Sundays.  Recheck INR in 1 week.  [facility] [phone]

## 2023-04-12 NOTE — Telephone Encounter (Signed)
Lpm to check INR 

## 2023-04-17 ENCOUNTER — Telehealth: Payer: Self-pay | Admitting: *Deleted

## 2023-04-17 ENCOUNTER — Ambulatory Visit (INDEPENDENT_AMBULATORY_CARE_PROVIDER_SITE_OTHER): Payer: Medicare Other | Admitting: *Deleted

## 2023-04-17 DIAGNOSIS — Z7901 Long term (current) use of anticoagulants: Secondary | ICD-10-CM

## 2023-04-17 DIAGNOSIS — Z952 Presence of prosthetic heart valve: Secondary | ICD-10-CM

## 2023-04-17 LAB — POCT INR: INR: 2.8 (ref 2.0–3.0)

## 2023-04-17 NOTE — Telephone Encounter (Signed)
Called pt since INR is due; left a message for him to complete the self testing. Will wait & follow up.

## 2023-04-17 NOTE — Patient Instructions (Addendum)
Description   Spoke with pt and instructed pt to take 1/2 tablet of warfarin today then continue taking warfarin 1 tablet daily except 1/2 tablet on Sundays.  Recheck INR in 1 week.  [facility] [phone]

## 2023-05-01 ENCOUNTER — Ambulatory Visit (INDEPENDENT_AMBULATORY_CARE_PROVIDER_SITE_OTHER): Payer: Medicare Other

## 2023-05-01 DIAGNOSIS — Z5181 Encounter for therapeutic drug level monitoring: Secondary | ICD-10-CM | POA: Diagnosis not present

## 2023-05-01 LAB — POCT INR: INR: 2.9 (ref 2.0–3.0)

## 2023-05-01 NOTE — Patient Instructions (Signed)
Description   Spoke with pt and instructed pt to START taking warfarin 1 tablet daily except 1/2 tablet on Sundays and Wednesdays.  Recheck INR in 1 week.  [facility] [phone]

## 2023-05-08 ENCOUNTER — Telehealth: Payer: Self-pay | Admitting: *Deleted

## 2023-05-08 NOTE — Telephone Encounter (Signed)
Called pt since INR is due; there was no answer so left a message for him to call back.

## 2023-05-09 ENCOUNTER — Ambulatory Visit (INDEPENDENT_AMBULATORY_CARE_PROVIDER_SITE_OTHER): Payer: Medicare Other

## 2023-05-09 DIAGNOSIS — Z5181 Encounter for therapeutic drug level monitoring: Secondary | ICD-10-CM

## 2023-05-09 LAB — POCT INR: INR: 2.8 (ref 2.0–3.0)

## 2023-05-09 NOTE — Patient Instructions (Signed)
Description   Spoke with pt and instructed pt to only take 1/2 tablet today and then continue taking warfarin 1 tablet daily except 1/2 tablet on Sundays and Wednesdays.  Recheck INR in 1 week.  [facility] [phone]

## 2023-05-15 ENCOUNTER — Telehealth: Payer: Self-pay | Admitting: *Deleted

## 2023-05-15 LAB — POCT INR: INR: 1.2 — AB (ref 2.0–3.0)

## 2023-05-15 NOTE — Telephone Encounter (Signed)
Spoke with pt and advised that INR is due; he states he was not feeling too well today on his birthday but he will get it done very soon. Will await and follow up.

## 2023-05-15 NOTE — Telephone Encounter (Signed)
Called pt since INR is due; there was no answer so left a message for him to call back.

## 2023-05-16 ENCOUNTER — Ambulatory Visit (INDEPENDENT_AMBULATORY_CARE_PROVIDER_SITE_OTHER): Payer: Medicare Other

## 2023-05-16 ENCOUNTER — Telehealth: Payer: Self-pay | Admitting: *Deleted

## 2023-05-16 DIAGNOSIS — Z952 Presence of prosthetic heart valve: Secondary | ICD-10-CM | POA: Diagnosis not present

## 2023-05-16 DIAGNOSIS — Z5181 Encounter for therapeutic drug level monitoring: Secondary | ICD-10-CM | POA: Diagnosis not present

## 2023-05-16 MED ORDER — WARFARIN SODIUM 5 MG PO TABS: ORAL_TABLET | ORAL | 0 refills | Status: DC

## 2023-05-16 NOTE — Telephone Encounter (Signed)
Pt called in stating his INR: 1.2

## 2023-05-16 NOTE — Telephone Encounter (Signed)
Called pt since INR is overdue; there was no answer so left a message.

## 2023-05-16 NOTE — Telephone Encounter (Signed)
Returned pt's call. Refer to anticoagulation encounter

## 2023-05-16 NOTE — Patient Instructions (Signed)
Description   Spoke with pt. Instructed to take 1.5 tablets today and 1.5 tablets tomorrow and then continue taking warfarin 1 tablet daily except 1/2 tablet on Sundays and Wednesdays.  Recheck INR in 1 week.  [facility] [phone]

## 2023-05-22 ENCOUNTER — Telehealth: Payer: Self-pay | Admitting: *Deleted

## 2023-05-22 NOTE — Telephone Encounter (Signed)
Called pt since INR is due; there was no answer so left a message to call back.

## 2023-05-23 ENCOUNTER — Ambulatory Visit (INDEPENDENT_AMBULATORY_CARE_PROVIDER_SITE_OTHER): Payer: Medicare Other

## 2023-05-23 ENCOUNTER — Telehealth: Payer: Self-pay | Admitting: *Deleted

## 2023-05-23 DIAGNOSIS — Z5181 Encounter for therapeutic drug level monitoring: Secondary | ICD-10-CM | POA: Diagnosis not present

## 2023-05-23 LAB — POCT INR: INR: 3.1 — AB (ref 2.0–3.0)

## 2023-05-23 NOTE — Telephone Encounter (Signed)
Called pt since INR is overdue; there was no answer so left another message. Pt needs to be reminded of compliance with testing recommendation dates and that if unable to follow will have to stop home monitoring and come into the office for appointments.

## 2023-05-23 NOTE — Patient Instructions (Signed)
Description   Spoke with pt. Instructed to only take 0.5 tablet today and then continue taking warfarin 1 tablet daily except 1/2 tablet on Sundays and Wednesdays.  Recheck INR in 1 week.  [facility] [phone]

## 2023-05-27 ENCOUNTER — Telehealth: Payer: Self-pay | Admitting: Internal Medicine

## 2023-05-27 MED ORDER — METOPROLOL SUCCINATE ER 50 MG PO TB24: 50.0000 mg | ORAL_TABLET | Freq: Every day | ORAL | 0 refills | Status: DC

## 2023-05-27 NOTE — Telephone Encounter (Signed)
*  STAT* If patient is at the pharmacy, call can be transferred to refill team.   1. Which medications need to be refilled? (please list name of each medication and dose if known)  new prescription forMetoprolol   2. Would you like to learn more about the convenience, safety, & potential cost savings by using the [facility]?    3. Are you open to using the [facility] (Type [facility].     Which pharmacy/location (including street and city if local pharmacy) is medication to be sent to? [facility] [address], [place]   5. Do they need a 30 day or 90 day supply? 90 days and refills- please call today, completely out

## 2023-05-27 NOTE — Telephone Encounter (Signed)
Spoke with patient and patient is aware of desired medication being sent to desired pharmacy for refills.

## 2023-05-30 ENCOUNTER — Telehealth: Payer: Self-pay | Admitting: *Deleted

## 2023-05-30 ENCOUNTER — Ambulatory Visit (INDEPENDENT_AMBULATORY_CARE_PROVIDER_SITE_OTHER): Payer: Medicare Other

## 2023-05-30 DIAGNOSIS — Z7901 Long term (current) use of anticoagulants: Secondary | ICD-10-CM

## 2023-05-30 DIAGNOSIS — Z952 Presence of prosthetic heart valve: Secondary | ICD-10-CM | POA: Diagnosis not present

## 2023-05-30 LAB — POCT INR: INR: 2.7 (ref 2.0–3.0)

## 2023-05-30 NOTE — Telephone Encounter (Signed)
Called pt since INR is due. There was no answer so left a message for pt to call back. Will await & follow up.

## 2023-05-30 NOTE — Patient Instructions (Signed)
 Description   Spoke with pt. Instructed to only take 0.5 tablet today and then continue taking warfarin 1 tablet daily except 1/2 tablet on Sundays and Wednesdays.  Recheck INR in 1 week.  [facility] [phone]

## 2023-06-06 ENCOUNTER — Telehealth: Payer: Self-pay

## 2023-06-06 NOTE — Telephone Encounter (Signed)
INR due. Called pt, no answer. Left message on voicemail.

## 2023-06-07 ENCOUNTER — Telehealth: Payer: Self-pay

## 2023-06-07 NOTE — Telephone Encounter (Signed)
INR overdue. Called, no answer. Left message on voicemail.

## 2023-06-11 ENCOUNTER — Telehealth: Payer: Self-pay

## 2023-06-11 ENCOUNTER — Ambulatory Visit (INDEPENDENT_AMBULATORY_CARE_PROVIDER_SITE_OTHER): Payer: Medicare Other | Admitting: Cardiovascular Disease

## 2023-06-11 DIAGNOSIS — Z5181 Encounter for therapeutic drug level monitoring: Secondary | ICD-10-CM | POA: Diagnosis not present

## 2023-06-11 DIAGNOSIS — Z952 Presence of prosthetic heart valve: Secondary | ICD-10-CM | POA: Diagnosis not present

## 2023-06-11 LAB — POCT INR: INR: 1.3 — AB (ref 2.0–3.0)

## 2023-06-11 NOTE — Patient Instructions (Signed)
Description   Called and spoke to pt and instructed him to take 1.5 tablets of warfarin today and tomorrow take 1 tablet of warfarin then continue to take warfarin 1 tablet daily except for 1/2 a tablet on Sundays and Wednesdays.  Recheck INR in 1 week.  [facility] [phone]

## 2023-06-11 NOTE — Telephone Encounter (Signed)
INR overdue. Called pt, no answer. Left detailed message on voicemail notifying pt that INR was due last week and self testing order will be discontinued if he is unable to check INR and report to [facility]. Anticoagulation Clinic can schedule an in person appt.

## 2023-06-20 ENCOUNTER — Ambulatory Visit (INDEPENDENT_AMBULATORY_CARE_PROVIDER_SITE_OTHER): Payer: Medicare Other | Admitting: *Deleted

## 2023-06-20 DIAGNOSIS — Z7901 Long term (current) use of anticoagulants: Secondary | ICD-10-CM

## 2023-06-20 DIAGNOSIS — Z952 Presence of prosthetic heart valve: Secondary | ICD-10-CM | POA: Diagnosis not present

## 2023-06-20 LAB — POCT INR: INR: 1.6 — AB (ref 2.0–3.0)

## 2023-06-20 NOTE — Patient Instructions (Signed)
Description   Called and spoke to pt and instructed him to take an extra 1/2 tablet today then continue warfarin taking 1 tablet of warfarin then continue to take warfarin 1 tablet daily except for 1/2 tablet on Sundays and Wednesdays.  Recheck INR in 1 week.  [facility] [phone]

## 2023-06-27 ENCOUNTER — Telehealth: Payer: Self-pay

## 2023-06-27 ENCOUNTER — Ambulatory Visit (INDEPENDENT_AMBULATORY_CARE_PROVIDER_SITE_OTHER): Payer: Medicare Other

## 2023-06-27 DIAGNOSIS — Z5181 Encounter for therapeutic drug level monitoring: Secondary | ICD-10-CM | POA: Diagnosis not present

## 2023-06-27 LAB — POCT INR: INR: 4.1 — AB (ref 2.0–3.0)

## 2023-06-27 NOTE — Patient Instructions (Signed)
Description   Called and spoke to pt and instructed him to HOLD today's dose and only take 1/2 tablet tomorrow and then continue warfarin taking 1 tablet of warfarin except for 1/2 tablet on Sundays and Wednesdays.   Recheck INR in 1 week.  [facility] [phone]

## 2023-06-27 NOTE — Telephone Encounter (Signed)
INR due. Called pt, no answer. Left message on voicemail.

## 2023-07-04 ENCOUNTER — Telehealth: Payer: Self-pay

## 2023-07-04 ENCOUNTER — Telehealth: Payer: Self-pay | Admitting: *Deleted

## 2023-07-04 NOTE — Telephone Encounter (Signed)
Left a message for the pt to call back regarding obtaining INR monitoring. Will await & follow up.

## 2023-07-04 NOTE — Telephone Encounter (Signed)
INR due. Called, no answer. Left message on voicemail.

## 2023-07-05 ENCOUNTER — Telehealth: Payer: Self-pay | Admitting: *Deleted

## 2023-07-05 NOTE — Telephone Encounter (Signed)
Pt overdue for INR check. Attempted to call pt. LMOM.

## 2023-07-08 ENCOUNTER — Ambulatory Visit (INDEPENDENT_AMBULATORY_CARE_PROVIDER_SITE_OTHER): Payer: Self-pay

## 2023-07-08 DIAGNOSIS — Z5181 Encounter for therapeutic drug level monitoring: Secondary | ICD-10-CM

## 2023-07-08 LAB — POCT INR: INR: 2.8 (ref 2.0–3.0)

## 2023-07-08 NOTE — Progress Notes (Signed)
This encounter was created in error - please disregard.

## 2023-07-08 NOTE — Patient Instructions (Signed)
Description   Called and spoke to pt and instructed him to only take 1/2 tablet today and then continue warfarin taking 1 tablet of warfarin except for 1/2 tablet on Sundays and Wednesdays.   Recheck INR in 1 week.  [facility] [phone]

## 2023-07-15 ENCOUNTER — Telehealth: Payer: Self-pay | Admitting: *Deleted

## 2023-07-15 NOTE — Telephone Encounter (Signed)
Called pt since INR is due; there was no answer so left him a message to call back. Will await and follow up.

## 2023-07-15 NOTE — Telephone Encounter (Signed)
Called pt since INR is due and he has not returned the call. Will await a call back from pt or call him back.

## 2023-07-16 ENCOUNTER — Telehealth: Payer: Self-pay | Admitting: *Deleted

## 2023-07-16 LAB — POCT INR: INR: 3.5 — AB (ref 2.0–3.0)

## 2023-07-16 NOTE — Telephone Encounter (Signed)
Called pt since INR testing is overdue. There was no answer so left another message for him to call back.

## 2023-07-17 ENCOUNTER — Ambulatory Visit (INDEPENDENT_AMBULATORY_CARE_PROVIDER_SITE_OTHER): Payer: Self-pay | Admitting: *Deleted

## 2023-07-17 ENCOUNTER — Telehealth: Payer: Self-pay | Admitting: *Deleted

## 2023-07-17 DIAGNOSIS — Z952 Presence of prosthetic heart valve: Secondary | ICD-10-CM | POA: Diagnosis not present

## 2023-07-17 DIAGNOSIS — Z7901 Long term (current) use of anticoagulants: Secondary | ICD-10-CM | POA: Diagnosis not present

## 2023-07-17 NOTE — Patient Instructions (Signed)
Description   Called and spoke to pt and instructed him since he did not take any warfarin last night then start taking warfarin taking 1 tablet of warfarin except for 1/2 tablet on Sundays, Wednesdays, and Fridays. Recheck INR in 1 week.  [facility] [phone]

## 2023-07-17 NOTE — Telephone Encounter (Signed)
Called pt since INR is overdue; there was no answer. Left another message for him to call back.

## 2023-07-19 ENCOUNTER — Ambulatory Visit: Payer: Medicare Other | Attending: Internal Medicine | Admitting: Internal Medicine

## 2023-07-23 ENCOUNTER — Telehealth: Payer: Self-pay | Admitting: *Deleted

## 2023-07-23 ENCOUNTER — Telehealth: Payer: Self-pay

## 2023-07-23 NOTE — Telephone Encounter (Signed)
INR due. Called pt, no answer. Left message on voicemail.

## 2023-07-23 NOTE — Telephone Encounter (Signed)
Called pt since INR is due; there was no answer so left another message.

## 2023-07-24 ENCOUNTER — Telehealth: Payer: Self-pay | Admitting: *Deleted

## 2023-07-24 NOTE — Telephone Encounter (Signed)
Called pt since INR is overdue. There was no answer so left him a message.  Pt will need to adhere to POC INR recommendations, make in office INR checks, or see if PCP to monitor POC INR. Will advise pt on these once we are able to speak with him.

## 2023-07-26 ENCOUNTER — Ambulatory Visit (INDEPENDENT_AMBULATORY_CARE_PROVIDER_SITE_OTHER): Payer: Medicare Other | Admitting: Cardiology

## 2023-07-26 ENCOUNTER — Telehealth: Payer: Self-pay | Admitting: *Deleted

## 2023-07-26 DIAGNOSIS — Z952 Presence of prosthetic heart valve: Secondary | ICD-10-CM | POA: Diagnosis not present

## 2023-07-26 DIAGNOSIS — Z7901 Long term (current) use of anticoagulants: Secondary | ICD-10-CM | POA: Diagnosis not present

## 2023-07-26 LAB — POCT INR: INR: 2.4 (ref 2.0–3.0)

## 2023-07-26 NOTE — Telephone Encounter (Signed)
Called pt since INR is overdue; there was no answer. Left another message for the t to call back regarding this matter.

## 2023-07-30 ENCOUNTER — Telehealth: Payer: Self-pay

## 2023-07-30 ENCOUNTER — Telehealth: Payer: Self-pay | Admitting: *Deleted

## 2023-07-30 NOTE — Telephone Encounter (Signed)
Called pt since INR is due; he states he will do it later.  Also, reminded pt he needs an appt with Cardiologist. He was last seen by Robin Searing on 02/01/22.

## 2023-07-30 NOTE — Telephone Encounter (Signed)
INR overdue. Pt states he has lost his INR machine and unable to check his INR. I made pt aware if he is unable to find machine, he will need to come in the office to the Coumadin Clinic. Scheduled pt a Coumadin Clinic for Monday, 08/05/23. Also made pt aware he needs to scheduled appt with Cardiologist while he is in the office on Monday at Check out. Pt verbalized understanding.

## 2023-08-05 ENCOUNTER — Ambulatory Visit: Payer: Medicare Other | Attending: Cardiology

## 2023-08-05 ENCOUNTER — Other Ambulatory Visit: Payer: Self-pay

## 2023-08-05 DIAGNOSIS — Z952 Presence of prosthetic heart valve: Secondary | ICD-10-CM | POA: Insufficient documentation

## 2023-08-05 DIAGNOSIS — Z7901 Long term (current) use of anticoagulants: Secondary | ICD-10-CM | POA: Insufficient documentation

## 2023-08-05 LAB — POCT INR: INR: 3.3 — AB (ref 2.0–3.0)

## 2023-08-05 MED ORDER — METOPROLOL SUCCINATE ER 50 MG PO TB24: 50.0000 mg | ORAL_TABLET | Freq: Every day | ORAL | 0 refills | Status: DC

## 2023-08-05 NOTE — Progress Notes (Signed)
Pt stopped by the office today to schedule follow up visit with Dr. Jacques Navy and to ask for a refill of metoprolol succinate 50mg  daily. Refill sent in. Rogelia Boga made appointment for pt and reached out to this RN for pt's medication refill.

## 2023-08-05 NOTE — Patient Instructions (Signed)
HOLD TONIGHT ONLY THEN continue taking warfarin taking 1 tablet of warfarin except for 1/2 tablet on Sundays, Wednesdays, and Fridays. Recheck INR in 1 week. [facility]in Clinic [phone]

## 2023-08-13 ENCOUNTER — Ambulatory Visit: Payer: Medicare Other | Attending: Internal Medicine | Admitting: *Deleted

## 2023-08-13 DIAGNOSIS — Z7901 Long term (current) use of anticoagulants: Secondary | ICD-10-CM | POA: Diagnosis not present

## 2023-08-13 DIAGNOSIS — Z952 Presence of prosthetic heart valve: Secondary | ICD-10-CM | POA: Diagnosis not present

## 2023-08-13 DIAGNOSIS — Z5181 Encounter for therapeutic drug level monitoring: Secondary | ICD-10-CM | POA: Diagnosis not present

## 2023-08-13 LAB — POCT INR: INR: 1.9 — AB (ref 2.0–3.0)

## 2023-08-13 NOTE — Patient Instructions (Signed)
Description   Today take 1.5 tablets of warfarin then continue taking warfarin taking 1 tablet of warfarin except for 1/2 tablet on Sundays, Wednesdays, and Fridays. Recheck INR in 1 week. [facility] [phone]

## 2023-08-14 ENCOUNTER — Ambulatory Visit: Payer: Medicare Other

## 2023-08-18 ENCOUNTER — Other Ambulatory Visit: Payer: Self-pay | Admitting: Internal Medicine

## 2023-08-18 DIAGNOSIS — Z952 Presence of prosthetic heart valve: Secondary | ICD-10-CM

## 2023-08-20 ENCOUNTER — Ambulatory Visit (INDEPENDENT_AMBULATORY_CARE_PROVIDER_SITE_OTHER): Payer: Medicare Other | Admitting: *Deleted

## 2023-08-20 DIAGNOSIS — Z7901 Long term (current) use of anticoagulants: Secondary | ICD-10-CM

## 2023-08-20 DIAGNOSIS — Z952 Presence of prosthetic heart valve: Secondary | ICD-10-CM

## 2023-08-20 LAB — POCT INR: INR: 3.5 — AB (ref 2.0–3.0)

## 2023-08-20 NOTE — Patient Instructions (Addendum)
Description   Spoke with pt and advised to take 1/2 tablet today and have a large serving of greens then continue taking warfarin taking 1 tablet of warfarin except for 1/2 tablet on Sundays, Wednesdays, and Fridays. Recheck INR in 1 week. [facility]in [facility] [phone]

## 2023-08-21 ENCOUNTER — Ambulatory Visit: Payer: Medicare Other

## 2023-08-27 ENCOUNTER — Telehealth: Payer: Self-pay | Admitting: *Deleted

## 2023-08-27 ENCOUNTER — Telehealth: Payer: Self-pay

## 2023-08-27 NOTE — Telephone Encounter (Signed)
I spoke to patient and reminded him to check INR, but he said that he has a "bad headache" and will check in the morning.

## 2023-08-27 NOTE — Telephone Encounter (Signed)
Called pt since INR is due. There was no answer so left a message. Will await a call back and follow up.

## 2023-08-28 ENCOUNTER — Ambulatory Visit (INDEPENDENT_AMBULATORY_CARE_PROVIDER_SITE_OTHER): Payer: Self-pay

## 2023-08-28 DIAGNOSIS — Z5181 Encounter for therapeutic drug level monitoring: Secondary | ICD-10-CM | POA: Diagnosis not present

## 2023-08-28 LAB — POCT INR: INR: 2.1 (ref 2.0–3.0)

## 2023-08-28 NOTE — Patient Instructions (Signed)
Description   Spoke with pt and advised continue taking warfarin taking 1 tablet of warfarin except for 1/2 tablet on Sundays, Wednesdays, and Fridays.  Stay consistent with greens each week (2 per week)  Recheck INR in 1 week. [facility] [phone]

## 2023-09-04 ENCOUNTER — Telehealth: Payer: Self-pay

## 2023-09-04 ENCOUNTER — Ambulatory Visit (INDEPENDENT_AMBULATORY_CARE_PROVIDER_SITE_OTHER): Payer: Medicare Other

## 2023-09-04 ENCOUNTER — Telehealth: Payer: Self-pay | Admitting: *Deleted

## 2023-09-04 DIAGNOSIS — Z7901 Long term (current) use of anticoagulants: Secondary | ICD-10-CM | POA: Diagnosis not present

## 2023-09-04 DIAGNOSIS — Z952 Presence of prosthetic heart valve: Secondary | ICD-10-CM | POA: Diagnosis not present

## 2023-09-04 LAB — POCT INR: INR: 2.9 (ref 2.0–3.0)

## 2023-09-04 NOTE — Telephone Encounter (Signed)
Called pt since INR is due. There was no answer so left a message to call back. Will follow up.

## 2023-09-04 NOTE — Telephone Encounter (Signed)
INR due. Called pt, no answer. Left message on voicemail.

## 2023-09-04 NOTE — Patient Instructions (Signed)
Description   Spoke with pt and advised him to skip today's dosage of Warfarin, then resume taking warfarin  1 tablet daily except for 1/2 tablet on Sundays, Wednesdays, and Fridays.  Stay consistent with greens each week (2 per week)  Recheck INR in 1 week. [facility] [phone]

## 2023-09-09 ENCOUNTER — Telehealth: Payer: Self-pay | Admitting: *Deleted

## 2023-09-09 NOTE — Telephone Encounter (Signed)
Called pt to remind him that his INR is due this week. There was no answer so left a message to remind him to perform INR and call us back once done preferably by Wednesday of this week.

## 2023-09-10 ENCOUNTER — Ambulatory Visit (INDEPENDENT_AMBULATORY_CARE_PROVIDER_SITE_OTHER): Payer: Medicare Other | Admitting: Internal Medicine

## 2023-09-10 ENCOUNTER — Telehealth: Payer: Self-pay | Admitting: *Deleted

## 2023-09-10 DIAGNOSIS — Z952 Presence of prosthetic heart valve: Secondary | ICD-10-CM

## 2023-09-10 DIAGNOSIS — Z7901 Long term (current) use of anticoagulants: Secondary | ICD-10-CM

## 2023-09-10 LAB — POCT INR: INR: 2.5 (ref 2.0–3.0)

## 2023-09-10 NOTE — Telephone Encounter (Signed)
Called pt since INR is due; there was no answer so left a message for him to call back and perform his home testing. Will await and follow up.

## 2023-09-10 NOTE — Telephone Encounter (Signed)
Spoke with pt and INR is due. He states he just came in from outside and is tired but he stated he will get it done.

## 2023-09-18 ENCOUNTER — Telehealth: Payer: Self-pay | Admitting: *Deleted

## 2023-09-18 ENCOUNTER — Telehealth: Payer: Self-pay

## 2023-09-18 ENCOUNTER — Ambulatory Visit (INDEPENDENT_AMBULATORY_CARE_PROVIDER_SITE_OTHER): Payer: Medicare Other

## 2023-09-18 DIAGNOSIS — Z7901 Long term (current) use of anticoagulants: Secondary | ICD-10-CM | POA: Diagnosis not present

## 2023-09-18 DIAGNOSIS — Z952 Presence of prosthetic heart valve: Secondary | ICD-10-CM

## 2023-09-18 LAB — POCT INR: INR: 4.5 — AB (ref 2.0–3.0)

## 2023-09-18 MED ORDER — WARFARIN SODIUM 5 MG PO TABS: ORAL_TABLET | ORAL | 0 refills | Status: DC

## 2023-09-18 NOTE — Telephone Encounter (Signed)
Called pt since INR is due; there was no answer so left a message for him to perform test and call back. Will await and follow up.

## 2023-09-18 NOTE — Patient Instructions (Signed)
Description   Called and spoke with pt. Instructed to HOLD today's dose and tomorrow's dose and then resume taking warfarin  1 tablet daily except for 1/2 tablet on Sundays, Wednesdays, and Fridays.  Stay consistent with greens each week (2 per week)  Recheck INR in 1 week. [facility] [phone]

## 2023-09-18 NOTE — Telephone Encounter (Signed)
INR due. Called pt, no answer. Left message on voicemail.

## 2023-09-25 ENCOUNTER — Telehealth: Payer: Self-pay | Admitting: *Deleted

## 2023-09-25 NOTE — Telephone Encounter (Signed)
Called pt since INR is due; there was no answer so left a message. Will await and follow up.

## 2023-09-26 ENCOUNTER — Ambulatory Visit (INDEPENDENT_AMBULATORY_CARE_PROVIDER_SITE_OTHER): Payer: Medicare Other | Admitting: *Deleted

## 2023-09-26 ENCOUNTER — Telehealth: Payer: Self-pay

## 2023-09-26 DIAGNOSIS — Z952 Presence of prosthetic heart valve: Secondary | ICD-10-CM | POA: Diagnosis not present

## 2023-09-26 DIAGNOSIS — Z7901 Long term (current) use of anticoagulants: Secondary | ICD-10-CM

## 2023-09-26 LAB — POCT INR: INR: 2 (ref 2.0–3.0)

## 2023-09-26 NOTE — Telephone Encounter (Signed)
INR overdue. Called pt, no answer. Left message on voicemail.

## 2023-09-26 NOTE — Patient Instructions (Signed)
Description   Called and spoke with pt and instructed to continue taking warfarin 1 tablet daily except for 1/2 tablet on Sundays, Wednesdays, and Fridays.  Stay consistent with greens each week (2 per week)  Recheck INR in 2 weeks.  [facility] [phone]

## 2023-10-10 ENCOUNTER — Telehealth: Payer: Self-pay

## 2023-10-10 NOTE — Telephone Encounter (Signed)
INR due. Called pt, no answer. Left message on voicemail.

## 2023-10-11 ENCOUNTER — Telehealth: Payer: Self-pay

## 2023-10-11 ENCOUNTER — Ambulatory Visit (INDEPENDENT_AMBULATORY_CARE_PROVIDER_SITE_OTHER): Payer: Self-pay

## 2023-10-11 DIAGNOSIS — Z5181 Encounter for therapeutic drug level monitoring: Secondary | ICD-10-CM | POA: Diagnosis not present

## 2023-10-11 LAB — POCT INR: INR: 2 (ref 2.0–3.0)

## 2023-10-11 NOTE — Patient Instructions (Signed)
Description   Called and spoke with pt and instructed to take 1 tablet today and then continue taking warfarin 1 tablet daily except for 1/2 tablet on Sundays, Wednesdays, and Fridays.  Stay consistent with greens each week (2 per week)  Recheck INR in 2 weeks.  [facility] [phone]

## 2023-10-11 NOTE — Telephone Encounter (Signed)
INR overdue. Called pt, no answer. Left message on voicemail with call back number.

## 2023-10-14 ENCOUNTER — Other Ambulatory Visit: Payer: Self-pay | Admitting: Internal Medicine

## 2023-10-14 DIAGNOSIS — Z952 Presence of prosthetic heart valve: Secondary | ICD-10-CM

## 2023-10-24 ENCOUNTER — Ambulatory Visit (INDEPENDENT_AMBULATORY_CARE_PROVIDER_SITE_OTHER): Payer: Medicare Other

## 2023-10-24 ENCOUNTER — Telehealth: Payer: Self-pay

## 2023-10-24 DIAGNOSIS — Z5181 Encounter for therapeutic drug level monitoring: Secondary | ICD-10-CM

## 2023-10-24 LAB — POCT INR: INR: 1.4 — AB (ref 2.0–3.0)

## 2023-10-24 NOTE — Patient Instructions (Signed)
 Description   Called and spoke with pt and instructed to take 1.5 tablets today and then continue taking warfarin 1 tablet daily except for 1/2 tablet on Sundays, Wednesdays, and Fridays.  Stay consistent with greens each week (2 per week)  Recheck INR in 2 weeks.  [facility] [phone]

## 2023-10-24 NOTE — Telephone Encounter (Signed)
 INR due. Called pt, no answer. Left message on voicemail.

## 2023-11-07 ENCOUNTER — Telehealth: Payer: Self-pay

## 2023-11-07 ENCOUNTER — Ambulatory Visit (INDEPENDENT_AMBULATORY_CARE_PROVIDER_SITE_OTHER): Payer: Medicare Other

## 2023-11-07 ENCOUNTER — Telehealth: Payer: Self-pay | Admitting: *Deleted

## 2023-11-07 DIAGNOSIS — Z5181 Encounter for therapeutic drug level monitoring: Secondary | ICD-10-CM | POA: Diagnosis not present

## 2023-11-07 LAB — POCT INR: INR: 1.5 — AB (ref 2.0–3.0)

## 2023-11-07 NOTE — Telephone Encounter (Signed)
Called pt since INR is due today; there was no answer so left him a message.

## 2023-11-07 NOTE — Telephone Encounter (Signed)
Pt's INR due. Called and spoke with pt. Pt states he will check INR later this afternoon.

## 2023-11-07 NOTE — Patient Instructions (Signed)
Description   Called and spoke with pt and instructed to take 2 tablets today and then continue taking warfarin 1 tablet daily except for 1/2 tablet on Sundays, Wednesdays, and Fridays.  Stay consistent with greens each week (2 per week)  Recheck INR in 2 weeks.  [facility] [phone]

## 2023-11-15 ENCOUNTER — Ambulatory Visit: Payer: Medicare Other | Attending: Internal Medicine | Admitting: Internal Medicine

## 2023-11-15 ENCOUNTER — Encounter: Payer: Self-pay | Admitting: Internal Medicine

## 2023-11-15 ENCOUNTER — Ambulatory Visit (INDEPENDENT_AMBULATORY_CARE_PROVIDER_SITE_OTHER): Payer: Medicare Other

## 2023-11-15 VITALS — BP 114/84 | HR 84 | Ht 69.0 in | Wt 182.0 lb

## 2023-11-15 DIAGNOSIS — I10 Essential (primary) hypertension: Secondary | ICD-10-CM | POA: Insufficient documentation

## 2023-11-15 DIAGNOSIS — Z952 Presence of prosthetic heart valve: Secondary | ICD-10-CM | POA: Insufficient documentation

## 2023-11-15 DIAGNOSIS — I493 Ventricular premature depolarization: Secondary | ICD-10-CM | POA: Diagnosis present

## 2023-11-15 DIAGNOSIS — I359 Nonrheumatic aortic valve disorder, unspecified: Secondary | ICD-10-CM | POA: Diagnosis present

## 2023-11-15 DIAGNOSIS — Z5181 Encounter for therapeutic drug level monitoring: Secondary | ICD-10-CM | POA: Diagnosis present

## 2023-11-15 DIAGNOSIS — Z7901 Long term (current) use of anticoagulants: Secondary | ICD-10-CM | POA: Insufficient documentation

## 2023-11-15 LAB — POCT INR: INR: 2.4 (ref 2.0–3.0)

## 2023-11-15 NOTE — Patient Instructions (Signed)
continue taking warfarin 1 tablet daily except for 1/2 tablet on Sundays, Wednesdays, and Fridays.  Stay consistent with greens each week (2 per week)  Recheck INR in 2 weeks.  [facility] [phone]

## 2023-11-15 NOTE — Progress Notes (Signed)
Cardiology Office Note:    Date:  11/15/2023   ID:  MOHMED FARVER, DOB [birth year], MRN [id number]  PCP:  Lester [place]., MD  Cardiologist:  Parke Poisson, MD  Electrophysiologist:  None   Referring MD: Lester ., MD   Chief Complaint/Reason for Referral: Mechanical AVR  History of Present Illness:    Max Ward is a 53 y.o. male with a history of anxiety, bipolar 1 disorder, CAD, and bicuspid aortic valve s/p AVR, and 46 mm ascending aorta dilatation by report previously.    Most recently seen in our practice by Robin Searing, NP April 2023.  He was doing overall well at that time.  He has seen a cardiologist outside our system since our last visit due to pain in his chest which he felt was his heart muscle or heart valve ripping, I am not able to access easily the reports from that visit but patient suggests no worrisome findings were found.  Overall he is doing better mentally and physically per his report and is participating in fasting which he feels helps.  This however has affected his INR which has been both supra and subtherapeutic over the last several months.  He does do home INR checks, but may benefit from reeducation with his new dietary plan.  Most recent echocardiogram was 2022 with stable mechanical aortic valve.  He is due for repeat echocardiogram with concerns of interval chest pain and known ascending aorta dilation.  Past Medical History:  Diagnosis Date   Anal fissure    Anemia    Anxiety    Arthritis    Asthma    Bipolar 1 disorder (HCC)    Complication of anesthesia    woke up during colonoscoy   DDD (degenerative disc disease), cervical    Depression    DJD (degenerative joint disease)    GERD (gastroesophageal reflux disease)    HLD (hyperlipidemia)    Hypertension    SVT (supraventricular tachycardia) (HCC)    s/p prior ablation by Dr Hollace Kinnier at [facility] for PJ[facility]    Past Surgical History:  Procedure Laterality Date   BONE  TUMOR EXCISION     skull   CARDIAC CATHETERIZATION  2018   [facility] reveals normal cors   GANGLION CYST EXCISION Right 09/03/2018   Procedure: REMOVAL GANGLION OF RIGHT WRIST AND RIGHT ELBOW;  Surgeon: Tarry Kos, MD;  Location: [facility];  Service: Orthopedics;  Laterality: Right;  OTHER SITE IS ELBOW   MECHANICAL AORTIC VALVE REPLACEMENT      Current Medications: Current Meds  Medication Sig   ADVAIR DISKUS 250-50 MCG/DOSE AEPB INHALE 1 PUFF BY MOUTH TWICE A DAY   albuterol (VENTOLIN HFA) 108 (90 Base) MCG/ACT inhaler INHALE 1-2 PUFFS BY MOUTH EVERY 4-6 HOURS AS NEEDED AND AS DIRECTED   alprazolam (XANAX) 2 MG tablet Take 2 mg by mouth 3 (three) times daily as needed for sleep.   amoxicillin-clavulanate (AUGMENTIN) 875-125 MG tablet Take 1 tablet by mouth every 12 (twelve) hours.   Asenapine Maleate 10 MG SUBL Place 30 mg under the tongue at bedtime.   azelastine (ASTELIN) 0.1 % nasal spray Place 1 spray into both nostrils 2 (two) times daily. Use in each nostril as directed   dicyclomine (BENTYL) 10 MG capsule TAKE 1 CAPSULE BY MOUTH 2 TIMES DAILY.   esomeprazole (NEXIUM) 40 MG capsule Take 1 capsule (40 mg total) by mouth daily at 12 noon.   fluticasone (FLONASE) 50 MCG/ACT  nasal spray Place 1 spray into both nostrils daily.   fluticasone furoate-vilanterol (BREO ELLIPTA) 100-25 MCG/ACT AEPB Inhale 1 puff into the lungs daily as needed.   hydrOXYzine (VISTARIL) 50 MG capsule Take 1 capsule by mouth as needed for anxiety.   linaclotide (LINZESS) 290 MCG CAPS capsule Take 1 capsule (290 mcg total) by mouth daily before breakfast.   metoprolol succinate (TOPROL-XL) 50 MG 24 hr tablet Take 1 tablet (50 mg total) by mouth daily. Take with or immediately following a meal. Please keep appointment with Dr. Jacques Navy in January for refills. Thank you!   montelukast (SINGULAIR) 10 MG tablet TAKE 1 TABLET BY MOUTH EVERY DAY   traZODone (DESYREL) 100 MG tablet Take 300 mg by mouth at  bedtime.   warfarin (COUMADIN) 5 MG tablet Take 1/2 tablet to 1 tablet by mouth daily as directed by the coumadin clinic.     Allergies:   Molds & smuts, Valium [diazepam], and Vicodin [hydrocodone-acetaminophen]   Social History   Tobacco Use   Smoking status: Every Day    Current packs/day: 1.00    Average packs/day: 1 pack/day for 15.0 years (15.0 ttl pk-yrs)    Types: Cigarettes   Smokeless tobacco: Never   Tobacco comments:    1/2 pack  or less per day Jan 2020, just "puffs" on cigars now  Vaping Use   Vaping status: Former  Substance Use Topics   Alcohol use: Yes    Comment: 1-3 beers on occ.    Drug use: No     Family History: The patient's family history includes Brain cancer in his maternal aunt; COPD in his father; Cancer in his paternal uncle; Colon cancer in his maternal uncle; Dementia in his father; Diabetes in his paternal grandmother; Emphysema in his father; Esophageal cancer in his maternal grandfather; Lung cancer in his mother. There is no history of Stomach cancer.  ROS:   Please see the history of present illness.    All other systems reviewed and are negative.  EKGs/Labs/Other Studies Reviewed:    The following studies were reviewed today:  EKG:  EKG Interpretation Date/Time:  Friday November 15 2023 09:34:15 EST Ventricular Rate:  84 PR Interval:  176 QRS Duration:  94 QT Interval:  388 QTC Calculation: 458 R Axis:   55  Text Interpretation: Sinus rhythm with occasional Premature ventricular complexes Confirmed by Weston Brass ([id number]) on 11/15/2023 9:54:37 AM     Recent Labs: No results found for requested labs within last 365 days.  Recent Lipid Panel No results found for: "CHOL", "TRIG", "HDL", "CHOLHDL", "VLDL", "LDLCALC", "LDLDIRECT"  Physical Exam:    VS:  BP 114/84 (BP Location: Left Arm, Patient Position: Sitting, Cuff Size: Normal)   Pulse 84   Ht 5\' 9"  (1.753 m)   Wt 182 lb (82.6 kg)   SpO2 98%   BMI 26.88 kg/m     Wt  Readings from Last 5 Encounters:  11/15/23 182 lb (82.6 kg)  02/01/22 230 lb (104.3 kg)  03/22/21 224 lb (101.6 kg)  02/27/21 224 lb 9.6 oz (101.9 kg)  01/31/21 224 lb (101.6 kg)    Constitutional: No acute distress Eyes: sclera non-icteric, normal conjunctiva and lids ENMT: normal dentition, moist mucous membranes Cardiovascular: Irregular rhythm, normal rate.  No murmurs.  S2 is clearly heard without the stethoscope, but does not have the crisp valve closure click I would have expected on auscultation.  Respiratory: clear to auscultation bilaterally GI : normal bowel sounds, soft and nontender.  No distention.   MSK: extremities warm, well perfused. No edema.  NEURO: grossly nonfocal exam, moves all extremities. PSYCH: alert and oriented x 3, normal mood and affect.   ASSESSMENT:    1. Essential hypertension   2. Aortic valve disorder   3. Status post mechanical aortic valve replacement   4. Encounter for therapeutic drug monitoring   5. Chronic anticoagulation   6. PVC (premature ventricular contraction)    PLAN:    Status post mechanical aortic valve replacement - Plan: ECHOCARDIOGRAM COMPLETE Chronic anticoagulation Long term (current) use of anticoagulants -Overall stable symptoms and I can hear the mechanical valve without the stethoscope suggesting stability of prosthesis despite variable INRs. -Repeat echocardiogram at this time   PVC (premature ventricular contraction) Palpitations SVT (supraventricular tachycardia) (HCC) -Patient feels he is stable on metoprolol, a few PVCs on EKG with no significant change in symptoms.  Bicuspid aortic valve Ascending aorta dilatation (HCC) -Patient has a report of a 46 mm ascending aorta, last echocardiogram does not demonstrate this.  We will reassess with current echocardiogram.  Weston Brass, MD, [facility] [place]  [facility]   Shared Decision Making/Informed Consent:       Medication Adjustments/Labs and Tests  Ordered: Current medicines are reviewed at length with the patient today.  Concerns regarding medicines are outlined above.   Orders Placed This Encounter  Procedures   EKG 12-Lead   ECHOCARDIOGRAM COMPLETE    No orders of the defined types were placed in this encounter.   Patient Instructions  Medication Instructions:  Continue current medications *If you need a refill on your cardiac medications before your next appointment, please call your pharmacy*   Lab Work: none If you have labs (blood work) drawn today and your tests are completely normal, you will receive your results only by: MyChart Message (if you have MyChart) OR A paper copy in the mail If you have any lab test that is abnormal or we need to change your treatment, we will call you to review the results.   Testing/Procedures: ECHO first available Your physician has requested that you have an echocardiogram. Echocardiography is a painless test that uses sound waves to create images of your heart. It provides your doctor with information about the size and shape of your heart and how well your heart's chambers and valves are working. This procedure takes approximately one hour. There are no restrictions for this procedure. Please do NOT wear cologne, perfume, aftershave, or lotions (deodorant is allowed). Please arrive 15 minutes prior to your appointment time.  Please note: We ask at that you not bring children with you during ultrasound (echo/ vascular) testing. Due to room size and safety concerns, children are not allowed in the ultrasound rooms during exams. Our front office staff cannot provide observation of children in our lobby area while testing is being conducted. An adult accompanying a patient to their appointment will only be allowed in the ultrasound room at the discretion of the ultrasound technician under special circumstances. We apologize for any inconvenience.    Follow-Up: At [facility],  you and your health needs are our priority.  As part of our continuing mission to provide you with exceptional heart care, we have created designated Provider Care Teams.  These Care Teams include your primary Cardiologist (physician) and Advanced Practice Providers (APPs -  Physician Assistants and Nurse Practitioners) who all work together to provide you with the care you need, when you need it.  We recommend signing  up for the patient portal called "MyChart".  Sign up information is provided on this After Visit Summary.  MyChart is used to connect with patients for Virtual Visits (Telemedicine).  Patients are able to view lab/test results, encounter notes, upcoming appointments, etc.  Non-urgent messages can be sent to your provider as well.   To learn more about what you can do with MyChart, go to [email or web address].    Your next appointment:   1 year(s)  Provider:   Dr. Jacques Navy or APP  Other Instructions Will f/u with Coumadin Clinic while here

## 2023-11-15 NOTE — Patient Instructions (Addendum)
Medication Instructions:  Continue current medications *If you need a refill on your cardiac medications before your next appointment, please call your pharmacy*   Lab Work: none If you have labs (blood work) drawn today and your tests are completely normal, you will receive your results only by: MyChart Message (if you have MyChart) OR A paper copy in the mail If you have any lab test that is abnormal or we need to change your treatment, we will call you to review the results.   Testing/Procedures: ECHO first available Your physician has requested that you have an echocardiogram. Echocardiography is a painless test that uses sound waves to create images of your heart. It provides your doctor with information about the size and shape of your heart and how well your heart's chambers and valves are working. This procedure takes approximately one hour. There are no restrictions for this procedure. Please do NOT wear cologne, perfume, aftershave, or lotions (deodorant is allowed). Please arrive 15 minutes prior to your appointment time.  Please note: We ask at that you not bring children with you during ultrasound (echo/ vascular) testing. Due to room size and safety concerns, children are not allowed in the ultrasound rooms during exams. Our front office staff cannot provide observation of children in our lobby area while testing is being conducted. An adult accompanying a patient to their appointment will only be allowed in the ultrasound room at the discretion of the ultrasound technician under special circumstances. We apologize for any inconvenience.    Follow-Up: At [facility], you and your health needs are our priority.  As part of our continuing mission to provide you with exceptional heart care, we have created designated Provider Care Teams.  These Care Teams include your primary Cardiologist (physician) and Advanced Practice Providers (APPs -  Physician Assistants and Nurse  Practitioners) who all work together to provide you with the care you need, when you need it.  We recommend signing up for the patient portal called "MyChart".  Sign up information is provided on this After Visit Summary.  MyChart is used to connect with patients for Virtual Visits (Telemedicine).  Patients are able to view lab/test results, encounter notes, upcoming appointments, etc.  Non-urgent messages can be sent to your provider as well.   To learn more about what you can do with MyChart, go to [email or web address].    Your next appointment:   1 year(s)  Provider:   Dr. Jacques Navy or APP  Other Instructions Will f/u with Coumadin Clinic while here

## 2023-11-28 ENCOUNTER — Ambulatory Visit (INDEPENDENT_AMBULATORY_CARE_PROVIDER_SITE_OTHER): Payer: Self-pay

## 2023-11-28 DIAGNOSIS — Z952 Presence of prosthetic heart valve: Secondary | ICD-10-CM | POA: Diagnosis not present

## 2023-11-28 DIAGNOSIS — Z7901 Long term (current) use of anticoagulants: Secondary | ICD-10-CM

## 2023-11-28 LAB — POCT INR: INR: 2 (ref 2.0–3.0)

## 2023-11-28 MED ORDER — WARFARIN SODIUM 5 MG PO TABS: ORAL_TABLET | ORAL | 1 refills | Status: DC

## 2023-11-28 NOTE — Patient Instructions (Signed)
Description   Spoke with pt, advised to continue taking warfarin 1 tablet daily except for 1/2 tablet on Sundays, Wednesdays, and Fridays.  Stay consistent with greens each week (2 per week)  Recheck INR in 2 weeks.  Coumadin Clinic [phone]

## 2023-12-09 ENCOUNTER — Ambulatory Visit (HOSPITAL_COMMUNITY): Payer: Medicare Other | Attending: Internal Medicine

## 2023-12-09 ENCOUNTER — Encounter (HOSPITAL_COMMUNITY): Payer: Self-pay

## 2023-12-09 DIAGNOSIS — I359 Nonrheumatic aortic valve disorder, unspecified: Secondary | ICD-10-CM | POA: Insufficient documentation

## 2023-12-09 LAB — ECHOCARDIOGRAM COMPLETE
AR max vel: 2.66 cm2
AV Area VTI: 2.69 cm2
AV Area mean vel: 2.36 cm2
AV Mean grad: 11.3 mm[Hg]
AV Peak grad: 18.8 mm[Hg]
Ao pk vel: 2.17 m/s
Area-P 1/2: 2.87 cm2
S' Lateral: 3.58 cm

## 2023-12-11 ENCOUNTER — Telehealth: Payer: Self-pay | Admitting: Internal Medicine

## 2023-12-11 NOTE — Telephone Encounter (Signed)
 Pt calling in for echo results

## 2023-12-11 NOTE — Telephone Encounter (Signed)
 Patient identification verified by 2 forms. Shade Flood, RN     Per DPR  LDVM regarding echo results with Dr. Lupe Carney comments.

## 2023-12-11 NOTE — Telephone Encounter (Signed)
 Patient identification verified by 2 forms. Shade Flood, RN     Patient returned call.  Patient made aware of the lab results. Verbalized understanding. No questions or concerns expressed at this time.   Patient will attend graduation soon. The event requires a lot of walking. Patient wants to know if he has any restrictions. Message sent to primary cardiologist for advise.   Patient agrees with plan, no questions at this time

## 2023-12-12 ENCOUNTER — Telehealth: Payer: Self-pay

## 2023-12-12 ENCOUNTER — Ambulatory Visit (INDEPENDENT_AMBULATORY_CARE_PROVIDER_SITE_OTHER): Payer: Self-pay

## 2023-12-12 DIAGNOSIS — Z5181 Encounter for therapeutic drug level monitoring: Secondary | ICD-10-CM | POA: Diagnosis not present

## 2023-12-12 LAB — POCT INR: INR: 1.6 — AB (ref 2.0–3.0)

## 2023-12-12 NOTE — Telephone Encounter (Signed)
 Called and spoke to pt regarding Dr. Lupe Carney activity recommendations.  He states that he usually gets SOB after walking from the parking lot at [employer], all the way to the back of the store.  Advised him to take breaks and sit down/rest at the upcoming graduation/event that he is planning to attend.  Pt verbalized understanding. No further concerns.

## 2023-12-12 NOTE — Telephone Encounter (Signed)
 INR due. Called pt, no answer. Left message on voicemail.

## 2023-12-12 NOTE — Patient Instructions (Signed)
 Description   Spoke with pt, advised to take 1.5 tablets today and then continue taking warfarin 1 tablet daily except for 1/2 tablet on Sundays, Wednesdays, and Fridays.  Stay consistent with greens each week (2 per week)  Recheck INR in 2 weeks.  Coumadin Clinic [phone]

## 2023-12-17 ENCOUNTER — Encounter: Payer: Self-pay | Admitting: *Deleted

## 2023-12-24 LAB — POCT INR: INR: 3.5 — AB (ref 2.0–3.0)

## 2023-12-25 ENCOUNTER — Ambulatory Visit (INDEPENDENT_AMBULATORY_CARE_PROVIDER_SITE_OTHER)

## 2023-12-25 DIAGNOSIS — Z7901 Long term (current) use of anticoagulants: Secondary | ICD-10-CM

## 2023-12-25 DIAGNOSIS — Z5181 Encounter for therapeutic drug level monitoring: Secondary | ICD-10-CM

## 2023-12-25 DIAGNOSIS — Z952 Presence of prosthetic heart valve: Secondary | ICD-10-CM

## 2024-01-08 ENCOUNTER — Telehealth: Payer: Self-pay

## 2024-01-08 ENCOUNTER — Telehealth: Payer: Self-pay | Admitting: *Deleted

## 2024-01-08 NOTE — Telephone Encounter (Signed)
 Pt returned coumadin clinic call and states he is out of town. Will check INR on Monday.

## 2024-01-08 NOTE — Telephone Encounter (Signed)
 Called pt since INR is due; there was no answer so left a message to call back regarding this.

## 2024-01-08 NOTE — Telephone Encounter (Signed)
 INR due. Called pt, no answer. Left message on voicemail.

## 2024-01-13 ENCOUNTER — Ambulatory Visit (INDEPENDENT_AMBULATORY_CARE_PROVIDER_SITE_OTHER): Payer: Self-pay | Admitting: Cardiology

## 2024-01-13 DIAGNOSIS — Z7901 Long term (current) use of anticoagulants: Secondary | ICD-10-CM | POA: Diagnosis not present

## 2024-01-13 DIAGNOSIS — Z952 Presence of prosthetic heart valve: Secondary | ICD-10-CM | POA: Diagnosis not present

## 2024-01-13 LAB — POCT INR: INR: 1.5 — AB (ref 2.0–3.0)

## 2024-01-14 ENCOUNTER — Other Ambulatory Visit: Payer: Self-pay | Admitting: Internal Medicine

## 2024-01-27 ENCOUNTER — Telehealth: Payer: Self-pay

## 2024-01-27 ENCOUNTER — Ambulatory Visit (INDEPENDENT_AMBULATORY_CARE_PROVIDER_SITE_OTHER): Payer: Self-pay | Admitting: Internal Medicine

## 2024-01-27 DIAGNOSIS — Z952 Presence of prosthetic heart valve: Secondary | ICD-10-CM | POA: Diagnosis not present

## 2024-01-27 DIAGNOSIS — Z7901 Long term (current) use of anticoagulants: Secondary | ICD-10-CM | POA: Diagnosis not present

## 2024-01-27 LAB — POCT INR: INR: 2.1 (ref 2.0–3.0)

## 2024-01-27 NOTE — Telephone Encounter (Signed)
 Lpm to check INR

## 2024-01-29 ENCOUNTER — Telehealth: Payer: Self-pay | Admitting: Internal Medicine

## 2024-01-29 MED ORDER — METOPROLOL SUCCINATE ER 50 MG PO TB24: 50.0000 mg | ORAL_TABLET | Freq: Every day | ORAL | 2 refills | Status: DC

## 2024-01-29 NOTE — Telephone Encounter (Signed)
 Pt's medication was sent to pt's pharmacy as requested. Confirmation received.

## 2024-01-29 NOTE — Telephone Encounter (Signed)
*  STAT* If patient is at the pharmacy, call can be transferred to refill team.   1. Which medications need to be refilled? (please list name of each medication and dose if known) metoprolol succinate (TOPROL-XL) 50 MG 24 hr tablet   2. Which pharmacy/location (including street and city if local pharmacy) is medication to be sent to? [id number] - [place], [place] - [address] [phone]   3. Do they need a 30 day or 90 day supply? 90

## 2024-02-10 ENCOUNTER — Telehealth: Payer: Self-pay

## 2024-02-10 ENCOUNTER — Ambulatory Visit (INDEPENDENT_AMBULATORY_CARE_PROVIDER_SITE_OTHER): Payer: Self-pay | Admitting: Cardiovascular Disease

## 2024-02-10 DIAGNOSIS — Z7901 Long term (current) use of anticoagulants: Secondary | ICD-10-CM

## 2024-02-10 DIAGNOSIS — Z952 Presence of prosthetic heart valve: Secondary | ICD-10-CM

## 2024-02-10 LAB — POCT INR: INR: 1.8 — AB (ref 2.0–3.0)

## 2024-02-10 NOTE — Telephone Encounter (Signed)
 Lpm to check INR

## 2024-02-24 ENCOUNTER — Telehealth: Payer: Self-pay

## 2024-02-24 NOTE — Telephone Encounter (Signed)
 Lpm to check INR

## 2024-02-25 ENCOUNTER — Ambulatory Visit (INDEPENDENT_AMBULATORY_CARE_PROVIDER_SITE_OTHER): Payer: Self-pay | Admitting: *Deleted

## 2024-02-25 ENCOUNTER — Telehealth: Payer: Self-pay | Admitting: *Deleted

## 2024-02-25 DIAGNOSIS — Z952 Presence of prosthetic heart valve: Secondary | ICD-10-CM

## 2024-02-25 DIAGNOSIS — Z7901 Long term (current) use of anticoagulants: Secondary | ICD-10-CM

## 2024-02-25 LAB — POCT INR: INR: 1.6 — AB (ref 2.0–3.0)

## 2024-02-25 NOTE — Patient Instructions (Signed)
 Description   Spoke with pt and advised to take 1.5 tablets today then START taking warfarin 1 tablet daily except for 1/2 tablet on Sundays and Wednesdays.  Stay consistent with greens each week (2 per week)  Recheck INR in 2 weeks.  Coumadin  Clinic [phone]

## 2024-02-25 NOTE — Telephone Encounter (Signed)
 Spoke with pt and he states he states will get it done now. Will await and follow up.

## 2024-02-25 NOTE — Telephone Encounter (Signed)
 Called pt since his INR is overdue; there was no answer so left a message.

## 2024-03-10 ENCOUNTER — Telehealth: Payer: Self-pay

## 2024-03-10 NOTE — Telephone Encounter (Signed)
 Lpm to check INR

## 2024-03-11 ENCOUNTER — Telehealth: Payer: Self-pay

## 2024-03-11 NOTE — Telephone Encounter (Signed)
INR overdue. Called pt, no answer. Left message on voicemail.

## 2024-03-13 ENCOUNTER — Telehealth: Payer: Self-pay

## 2024-03-13 NOTE — Telephone Encounter (Signed)
 Lpm to check INR today

## 2024-03-16 ENCOUNTER — Telehealth: Payer: Self-pay

## 2024-03-16 NOTE — Telephone Encounter (Signed)
 Called and spoke with pt concerning INR that was due on 03/10/24. Pt states he is out of town and does not have very good cell phone service. Pt states he also tried to check his INR yesterday but his machine is "messing up". I attempted to troubleshoot the INR machine issues with pt; however, pt states he will call [employer] Wednesday to resolve the issue when he gets back home. Pt also states he attempted to call the Coumadin  Clinic to make us  aware last week but "couldn't get through". I made him aware he should always call our direct coumadin  clinic line [phone]) with issues/concerns for Warfarin and INR results. I provided pt with this number but pt states "I don't have anything to write with and I will probably forget it". I did educated pt on the importance of checking INR as ordered by the coumadin  clinic and the risk of not monitoring correctly. I also made him aware if INR is not checked within the next few days and he is unable to check with his self-monitoring machine, we will need to scheduled an in-person visit with the coumadin  clinic to check INR. Pt verbalized understanding and and states he will call by Wednesday when he is back in town with an update and hopefully INR results.

## 2024-03-18 ENCOUNTER — Telehealth: Payer: Self-pay | Admitting: *Deleted

## 2024-03-18 NOTE — Telephone Encounter (Signed)
 Called pt since INR is due today. He states he just woke and has not called the company. He states his ear is bothering and thinks it's sinus problems. Advised will need to make an appt in the office until machine is fixed. Asked what was wrong with it and he states the dates keeps blinking. Advised the machine needs to be set. Walked him through the process and he states he has hit another button and messed it up again. He states he will call back if he gets it fixed but also made an appointment in the office in the event he does call back with any results.

## 2024-03-23 ENCOUNTER — Ambulatory Visit (INDEPENDENT_AMBULATORY_CARE_PROVIDER_SITE_OTHER): Payer: Self-pay | Admitting: *Deleted

## 2024-03-23 DIAGNOSIS — Z5181 Encounter for therapeutic drug level monitoring: Secondary | ICD-10-CM

## 2024-03-23 DIAGNOSIS — Z7901 Long term (current) use of anticoagulants: Secondary | ICD-10-CM

## 2024-03-23 DIAGNOSIS — Z952 Presence of prosthetic heart valve: Secondary | ICD-10-CM

## 2024-03-23 LAB — POCT INR: INR: 1.7 — AB (ref 2.0–3.0)

## 2024-03-23 NOTE — Patient Instructions (Signed)
 Description   Spoke with pt and advised to take 1.5 tablets today then continue taking warfarin 1 tablet daily except for 1/2 tablet on Sundays and Wednesdays.  Stay consistent with greens each week (2 per week)  Recheck INR in 2 weeks.  Coumadin  Clinic [phone]

## 2024-03-24 ENCOUNTER — Ambulatory Visit

## 2024-04-06 ENCOUNTER — Telehealth: Payer: Self-pay | Admitting: *Deleted

## 2024-04-06 ENCOUNTER — Ambulatory Visit (INDEPENDENT_AMBULATORY_CARE_PROVIDER_SITE_OTHER): Payer: Self-pay | Admitting: Internal Medicine

## 2024-04-06 DIAGNOSIS — Z7901 Long term (current) use of anticoagulants: Secondary | ICD-10-CM

## 2024-04-06 DIAGNOSIS — Z952 Presence of prosthetic heart valve: Secondary | ICD-10-CM

## 2024-04-06 LAB — POCT INR: INR: 2.8 (ref 2.0–3.0)

## 2024-04-06 NOTE — Telephone Encounter (Signed)
 Called pt since he is due to have his INR done. He states he just woke up and he will get it done soon. Will await.

## 2024-04-06 NOTE — Progress Notes (Signed)
Please see anticoagulation encounter.

## 2024-04-20 ENCOUNTER — Ambulatory Visit (INDEPENDENT_AMBULATORY_CARE_PROVIDER_SITE_OTHER): Admitting: *Deleted

## 2024-04-20 ENCOUNTER — Telehealth: Payer: Self-pay | Admitting: *Deleted

## 2024-04-20 DIAGNOSIS — Z952 Presence of prosthetic heart valve: Secondary | ICD-10-CM | POA: Diagnosis not present

## 2024-04-20 DIAGNOSIS — Z7901 Long term (current) use of anticoagulants: Secondary | ICD-10-CM | POA: Diagnosis not present

## 2024-04-20 LAB — POCT INR: INR: 1.7 — AB (ref 2.0–3.0)

## 2024-04-20 NOTE — Progress Notes (Signed)
Please see anticoagulation encounter.

## 2024-04-20 NOTE — Patient Instructions (Signed)
 Description   Spoke with pt and advised to take 1.5 tablets of warfarin today then continue taking warfarin 1 tablet daily except for 1/2 tablet on Sundays and Wednesdays. Stay consistent with greens each week (2 per week)  Recheck INR in 2 weeks.  Coumadin  Clinic [phone]

## 2024-04-20 NOTE — Telephone Encounter (Signed)
 Called patient since INR is due; there was no answer so left a message.

## 2024-05-04 ENCOUNTER — Telehealth: Payer: Self-pay | Admitting: *Deleted

## 2024-05-04 ENCOUNTER — Telehealth: Payer: Self-pay

## 2024-05-04 NOTE — Telephone Encounter (Signed)
 Called pt since INR is due; there was no answer so left a message.

## 2024-05-04 NOTE — Telephone Encounter (Signed)
 Lpm to check INR

## 2024-05-05 ENCOUNTER — Telehealth: Payer: Self-pay | Admitting: *Deleted

## 2024-05-05 ENCOUNTER — Ambulatory Visit (INDEPENDENT_AMBULATORY_CARE_PROVIDER_SITE_OTHER): Admitting: Internal Medicine

## 2024-05-05 DIAGNOSIS — Z952 Presence of prosthetic heart valve: Secondary | ICD-10-CM

## 2024-05-05 DIAGNOSIS — Z7901 Long term (current) use of anticoagulants: Secondary | ICD-10-CM

## 2024-05-05 LAB — POCT INR: INR: 1.5 — AB (ref 2.0–3.0)

## 2024-05-05 NOTE — Telephone Encounter (Signed)
 Spoke with pt and he stated he will do it soon as he is still in bed and his ears are ringing. He states he will get it done by the end of the day. Will await and follow up

## 2024-05-05 NOTE — Progress Notes (Signed)
 Inr 1.5  Please see anticoagulation encounter

## 2024-05-18 ENCOUNTER — Telehealth: Payer: Self-pay | Admitting: *Deleted

## 2024-05-18 ENCOUNTER — Ambulatory Visit (INDEPENDENT_AMBULATORY_CARE_PROVIDER_SITE_OTHER): Payer: Self-pay | Admitting: *Deleted

## 2024-05-18 DIAGNOSIS — Z952 Presence of prosthetic heart valve: Secondary | ICD-10-CM

## 2024-05-18 DIAGNOSIS — Z7901 Long term (current) use of anticoagulants: Secondary | ICD-10-CM

## 2024-05-18 LAB — POCT INR: INR: 1.7 — AB (ref 2.0–3.0)

## 2024-05-18 NOTE — Telephone Encounter (Signed)
 Called patient since INR is due; there was no answer so left a message for him to perform test and call back. Will await a call back.

## 2024-05-18 NOTE — Patient Instructions (Addendum)
 Description   Spoke with pt and advised to take 1.5 tablets of warfarin today then START taking the dose you should be taking, which is, 1 tablet daily except for 1/2 tablet on Wednesdays ONLY. Stay consistent with greens each week (2 per week)  Recheck INR in 2 weeks.  [facility] [phone]

## 2024-05-18 NOTE — Progress Notes (Signed)
 INR 1.7  Please see anticoagulation encounter

## 2024-05-31 ENCOUNTER — Other Ambulatory Visit: Payer: Self-pay | Admitting: Internal Medicine

## 2024-05-31 DIAGNOSIS — Z952 Presence of prosthetic heart valve: Secondary | ICD-10-CM

## 2024-06-01 ENCOUNTER — Ambulatory Visit (INDEPENDENT_AMBULATORY_CARE_PROVIDER_SITE_OTHER): Payer: Self-pay | Admitting: Cardiology

## 2024-06-01 DIAGNOSIS — Z7901 Long term (current) use of anticoagulants: Secondary | ICD-10-CM

## 2024-06-01 DIAGNOSIS — Z952 Presence of prosthetic heart valve: Secondary | ICD-10-CM

## 2024-06-01 LAB — POCT INR: INR: 2.2 (ref 2.0–3.0)

## 2024-06-01 NOTE — Progress Notes (Signed)
 INR 2.2 Please see anticoagulation encounter   Spoke with pt and advised to continue 1 tablet daily except for 1/2 tablet on Wednesdays ONLY. Stay consistent with greens each week (2 per week)  Recheck INR in 2 weeks.  [facility] [phone]

## 2024-06-16 ENCOUNTER — Ambulatory Visit (INDEPENDENT_AMBULATORY_CARE_PROVIDER_SITE_OTHER): Payer: Self-pay | Admitting: *Deleted

## 2024-06-16 ENCOUNTER — Telehealth: Payer: Self-pay | Admitting: *Deleted

## 2024-06-16 DIAGNOSIS — Z952 Presence of prosthetic heart valve: Secondary | ICD-10-CM

## 2024-06-16 DIAGNOSIS — Z7901 Long term (current) use of anticoagulants: Secondary | ICD-10-CM

## 2024-06-16 LAB — POCT INR: INR: 2.2 (ref 2.0–3.0)

## 2024-06-16 NOTE — Patient Instructions (Signed)
 Description   INR-2.2; Spoke with pt and advised to continue 1 tablet daily except for 1/2 tablet on Wednesdays ONLY. Stay consistent with greens each week (2 per week). Recheck INR in 2 weeks.  [facility] [phone]

## 2024-06-16 NOTE — Telephone Encounter (Signed)
 Called patient since INR is due; he states he will take care of it later as he is in [place] now and heading home around 12 noon and take care of it.

## 2024-06-16 NOTE — Progress Notes (Signed)
 Description   INR-2.2; Spoke with pt and advised to continue 1 tablet daily except for 1/2 tablet on Wednesdays ONLY. Stay consistent with greens each week (2 per week). Recheck INR in 2 weeks.  [facility] [phone]

## 2024-06-30 ENCOUNTER — Ambulatory Visit (INDEPENDENT_AMBULATORY_CARE_PROVIDER_SITE_OTHER)

## 2024-06-30 DIAGNOSIS — Z7901 Long term (current) use of anticoagulants: Secondary | ICD-10-CM

## 2024-06-30 DIAGNOSIS — Z952 Presence of prosthetic heart valve: Secondary | ICD-10-CM

## 2024-06-30 LAB — POCT INR: INR: 1.4 — AB (ref 2.0–3.0)

## 2024-06-30 NOTE — Patient Instructions (Signed)
 Description   INR-1.4; Spoke with pt and advised to take 1.5 tablets today and 1 tablet tomorrow, then resume same dosage 1 tablet daily except for 1/2 tablet on Wednesdays ONLY. Stay consistent with greens each week (2 per week). Recheck INR in 2 weeks.  [facility] [phone]

## 2024-06-30 NOTE — Progress Notes (Signed)
 Description   INR-1.4; Spoke with pt and advised to take 1.5 tablets today and 1 tablet tomorrow, then resume same dosage 1 tablet daily except for 1/2 tablet on Wednesdays ONLY. Stay consistent with greens each week (2 per week). Recheck INR in 2 weeks.  [facility] [phone]

## 2024-07-06 ENCOUNTER — Other Ambulatory Visit: Payer: Self-pay | Admitting: Internal Medicine

## 2024-07-06 ENCOUNTER — Other Ambulatory Visit: Payer: Self-pay

## 2024-07-06 DIAGNOSIS — Z952 Presence of prosthetic heart valve: Secondary | ICD-10-CM

## 2024-07-06 MED ORDER — WARFARIN SODIUM 5 MG PO TABS: ORAL_TABLET | ORAL | 1 refills | Status: DC

## 2024-07-06 NOTE — Telephone Encounter (Signed)
*  STAT* If patient is at the pharmacy, call can be transferred to refill team.   1. Which medications need to be refilled? (please list name of each medication and dose if known) warfarin (COUMADIN ) 5 MG tablet    2. Would you like to learn more about the convenience, safety, & potential cost savings by using the [facility]?    3. Are you open to using the [facility] (Type [facility].  ).   4. Which pharmacy/location (including street and city if local pharmacy) is medication to be sent to? [id number] - [place], [place] - [address]    5. Do they need a 30 day or 90 day supply? 90 day

## 2024-07-07 ENCOUNTER — Ambulatory Visit (INDEPENDENT_AMBULATORY_CARE_PROVIDER_SITE_OTHER)

## 2024-07-07 DIAGNOSIS — Z952 Presence of prosthetic heart valve: Secondary | ICD-10-CM

## 2024-07-07 DIAGNOSIS — Z7901 Long term (current) use of anticoagulants: Secondary | ICD-10-CM

## 2024-07-07 LAB — POCT INR: INR: 1.6 — AB (ref 2.0–3.0)

## 2024-07-07 NOTE — Progress Notes (Signed)
 Description   INR-1.6; Spoke with pt and advised to take 1.5 tablets today and start taking 1 tablet daily.  Stay consistent with greens each week (2 per week). Recheck INR in 1 week.  [facility] [phone]

## 2024-07-07 NOTE — Patient Instructions (Signed)
 Description   INR-1.6; Spoke with pt and advised to take 1.5 tablets today and start taking 1 tablet daily.  Stay consistent with greens each week (2 per week). Recheck INR in 1 week.  [facility] [phone]

## 2024-07-08 ENCOUNTER — Telehealth: Payer: Self-pay | Admitting: Internal Medicine

## 2024-07-08 NOTE — Telephone Encounter (Signed)
 Patient calling verbalized he had  chest  pain that start this morning after lifting water. Per patient he is not sure if he pulled a muscle. Patient reports left arm went numb this morning,  and chest tightness on left side but reported able to move left arm now.  Patient reports he thought it may be anxiety attack.  Denies SOB, lightheaded, dizziness.  Denies check blood pressure. Patient reports symptoms have resolved now but left upper and lower extremity is still  tingling. Advise patient to go to Emergency Room. To be evaluated. Per patient he verbalized he prefers not to go to [facility] Emergency Room. Patient given address to [facility], Advise patient to call EMS. Patient verbalized if unable to drive or get a  ride, he will call EMS.

## 2024-07-08 NOTE — Telephone Encounter (Signed)
 Pt c/o of Chest Pain: STAT if active (IN THIS MOMENT) CP, including tightness, pressure, jaw pain, shoulder/upper arm/back pain, SOB, nausea, and vomiting.  1. Are you having CP right now (tightness, pressure, or discomfort)? Yes discomfort   2. Are you experiencing any other symptoms (ex. SOB, nausea, vomiting, sweating)? No  3. How long have you been experiencing CP? This morning   4. Is your CP continuous or coming and going? Continuous   5. Have you taken Nitroglycerin? No   6. If CP returns before callback, please consider calling 911. ?

## 2024-07-13 ENCOUNTER — Telehealth: Payer: Self-pay | Admitting: *Deleted

## 2024-07-13 NOTE — Telephone Encounter (Signed)
 Patient called to report that he was taking claritin d and wanted to know if it interacts with warfarin. Advised that the claritin d is safe to take with warfarin. Also, advised that he is due to have INR done tomorrow and we will need to stay on track. He states well I took my medication a little later than normal last night and to try to stay on a timely routine.   Then he states that he has placed a call to the doctor for anxiousness and chest pain and feeling like something is wrong, advised he should go to the ER for an urgent needs and he states he is not short of breath or anything but would rather come to the office than go to ER cause of all the germs and he don't like the hospital. Advised that if something is wrong he should go get it checked out or call for emergency transport if something is vital going on with him, he states he really just needs labs done to be sure he is fine and he is not having the chest pain now. He states from time to time he gets worked up and anxious and he thinks it is from that. Then he states his arm went numb last week and think it was a stroke, advised that he should go tot the ER and get this checked out since this is an urgent matter. Again, he states he will not go to the hospital. Advised he could call his PCP to check him out if he does not want to go to the ER. He states he will rest for now since he is not anxious at this time.

## 2024-07-14 ENCOUNTER — Telehealth: Payer: Self-pay | Admitting: *Deleted

## 2024-07-14 ENCOUNTER — Ambulatory Visit (INDEPENDENT_AMBULATORY_CARE_PROVIDER_SITE_OTHER): Payer: Self-pay

## 2024-07-14 DIAGNOSIS — Z952 Presence of prosthetic heart valve: Secondary | ICD-10-CM

## 2024-07-14 DIAGNOSIS — Z7901 Long term (current) use of anticoagulants: Secondary | ICD-10-CM

## 2024-07-14 LAB — POCT INR: INR: 1.6 — AB (ref 2.0–3.0)

## 2024-07-14 NOTE — Telephone Encounter (Signed)
 Patient called to report he went to check his INR this morning and he was out of strips and that he ordered them last week. 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 He states he will can come in to have INR checked but will have to come after the rain has stopped. He states he hopes his back in better by Thursday and that his sinus problem stops.

## 2024-07-16 ENCOUNTER — Encounter

## 2024-07-21 ENCOUNTER — Ambulatory Visit (INDEPENDENT_AMBULATORY_CARE_PROVIDER_SITE_OTHER): Admitting: Cardiology

## 2024-07-21 DIAGNOSIS — Z7901 Long term (current) use of anticoagulants: Secondary | ICD-10-CM

## 2024-07-21 DIAGNOSIS — Z952 Presence of prosthetic heart valve: Secondary | ICD-10-CM

## 2024-07-21 LAB — POCT INR: INR: 2.2 (ref 2.0–3.0)

## 2024-08-04 ENCOUNTER — Telehealth: Payer: Self-pay | Admitting: *Deleted

## 2024-08-04 ENCOUNTER — Ambulatory Visit (INDEPENDENT_AMBULATORY_CARE_PROVIDER_SITE_OTHER): Payer: Self-pay

## 2024-08-04 DIAGNOSIS — Z7901 Long term (current) use of anticoagulants: Secondary | ICD-10-CM

## 2024-08-04 DIAGNOSIS — Z952 Presence of prosthetic heart valve: Secondary | ICD-10-CM | POA: Diagnosis not present

## 2024-08-04 LAB — POCT INR: INR: 2.2 (ref 2.0–3.0)

## 2024-08-04 NOTE — Telephone Encounter (Signed)
 Called patient since INR is due; he states he just woke up and his allergies are bothering him as usual but he will get it done in a few. Will await and follow up.

## 2024-08-18 ENCOUNTER — Ambulatory Visit: Payer: Self-pay

## 2024-08-18 ENCOUNTER — Telehealth: Payer: Self-pay | Admitting: *Deleted

## 2024-08-18 DIAGNOSIS — Z7901 Long term (current) use of anticoagulants: Secondary | ICD-10-CM

## 2024-08-18 DIAGNOSIS — Z952 Presence of prosthetic heart valve: Secondary | ICD-10-CM

## 2024-08-18 LAB — POCT INR: INR: 2.5 (ref 2.0–3.0)

## 2024-08-18 NOTE — Telephone Encounter (Signed)
 Spoke with patient and advised INR was due. He states he will perform INR testing soon and call it in to company.

## 2024-09-01 ENCOUNTER — Ambulatory Visit (INDEPENDENT_AMBULATORY_CARE_PROVIDER_SITE_OTHER): Payer: Self-pay | Admitting: *Deleted

## 2024-09-01 ENCOUNTER — Telehealth: Payer: Self-pay | Admitting: *Deleted

## 2024-09-01 DIAGNOSIS — Z7901 Long term (current) use of anticoagulants: Secondary | ICD-10-CM

## 2024-09-01 DIAGNOSIS — Z5181 Encounter for therapeutic drug level monitoring: Secondary | ICD-10-CM

## 2024-09-01 DIAGNOSIS — Z952 Presence of prosthetic heart valve: Secondary | ICD-10-CM

## 2024-09-01 LAB — POCT INR: INR: 3.1 — AB (ref 2.0–3.0)

## 2024-09-01 NOTE — Telephone Encounter (Signed)
 Spoke with patient and he stated he will get it INR done soon.

## 2024-09-01 NOTE — Patient Instructions (Addendum)
 Description   INR-3.1; Spoke with pt and advised to take 1/2 tablet of warfarin today then continue taking 1 tablet daily.  Stay consistent with greens each week (2 per week). Recheck INR in 2 weeks.  [facility] [phone]

## 2024-09-01 NOTE — Progress Notes (Signed)
 Description   INR-3.1; Spoke with pt and advised to take 1/2 tablet of warfarin today then continue taking 1 tablet daily.  Stay consistent with greens each week (2 per week). Recheck INR in 2 weeks.  [facility] [phone]

## 2024-09-15 ENCOUNTER — Telehealth: Payer: Self-pay | Admitting: *Deleted

## 2024-09-15 ENCOUNTER — Ambulatory Visit: Payer: Self-pay | Admitting: *Deleted

## 2024-09-15 DIAGNOSIS — Z5181 Encounter for therapeutic drug level monitoring: Secondary | ICD-10-CM

## 2024-09-15 DIAGNOSIS — Z7901 Long term (current) use of anticoagulants: Secondary | ICD-10-CM

## 2024-09-15 DIAGNOSIS — Z952 Presence of prosthetic heart valve: Secondary | ICD-10-CM

## 2024-09-15 LAB — POCT INR: INR: 2.2 (ref 2.0–3.0)

## 2024-09-15 NOTE — Telephone Encounter (Signed)
 Called patient since INR is due, there was no answer so left a message to call back.

## 2024-09-15 NOTE — Progress Notes (Signed)
 Description   INR-2.2; Spoke with pt and advised to continue taking 1 tablet daily.  Stay consistent with greens each week (2 per week). Recheck INR in 2 weeks.  [facility] [phone]

## 2024-09-15 NOTE — Patient Instructions (Signed)
 Description   INR-2.2; Spoke with pt and advised to continue taking 1 tablet daily.  Stay consistent with greens each week (2 per week). Recheck INR in 2 weeks.  [facility] [phone]

## 2024-09-29 ENCOUNTER — Telehealth: Payer: Self-pay | Admitting: *Deleted

## 2024-09-29 ENCOUNTER — Other Ambulatory Visit: Payer: Self-pay | Admitting: *Deleted

## 2024-09-29 ENCOUNTER — Ambulatory Visit (INDEPENDENT_AMBULATORY_CARE_PROVIDER_SITE_OTHER): Admitting: *Deleted

## 2024-09-29 DIAGNOSIS — Z952 Presence of prosthetic heart valve: Secondary | ICD-10-CM

## 2024-09-29 DIAGNOSIS — Z7901 Long term (current) use of anticoagulants: Secondary | ICD-10-CM

## 2024-09-29 LAB — POCT INR: INR: 2.3 (ref 2.0–3.0)

## 2024-09-29 MED ORDER — WARFARIN SODIUM 5 MG PO TABS: ORAL_TABLET | ORAL | 1 refills | Status: AC

## 2024-09-29 NOTE — Telephone Encounter (Signed)
 Called patient since INR is due; there was no answer so left a message to call [phone] regarding this.

## 2024-09-29 NOTE — Patient Instructions (Signed)
 Description   INR-2.3; Spoke with pt and advised to continue taking 1 tablet daily.  Stay consistent with greens each week (2 per week). Recheck INR in 2 weeks.  [facility] [phone]

## 2024-09-29 NOTE — Telephone Encounter (Signed)
 Warfarin 5mg  Dx-Status post mechanical aortic valve replacement  Last INR Check-09/29/24 Last OV- 11/15/23  [facility] per phone conversation at this encounter.

## 2024-09-29 NOTE — Progress Notes (Signed)
 Description   INR-2.3; Spoke with pt and advised to continue taking 1 tablet daily.  Stay consistent with greens each week (2 per week). Recheck INR in 2 weeks.  [facility] [phone]

## 2024-10-13 ENCOUNTER — Telehealth: Payer: Self-pay | Admitting: *Deleted

## 2024-10-13 LAB — POCT INR: INR: 1.2 — AB (ref 2.0–3.0)

## 2024-10-13 NOTE — Telephone Encounter (Signed)
 Called patient since he was supposed to perform INR testing later today as he stated per previous phone call; there was no answer so left him a message.

## 2024-10-13 NOTE — Telephone Encounter (Signed)
 Spoke with patient and he states he had to lay back down since having a bad morning and that he will perform INR testing later today and call back.

## 2024-10-14 ENCOUNTER — Ambulatory Visit (INDEPENDENT_AMBULATORY_CARE_PROVIDER_SITE_OTHER): Payer: Self-pay

## 2024-10-14 DIAGNOSIS — Z7901 Long term (current) use of anticoagulants: Secondary | ICD-10-CM

## 2024-10-14 DIAGNOSIS — Z952 Presence of prosthetic heart valve: Secondary | ICD-10-CM

## 2024-10-20 ENCOUNTER — Telehealth: Payer: Self-pay | Admitting: *Deleted

## 2024-10-20 DIAGNOSIS — Z952 Presence of prosthetic heart valve: Secondary | ICD-10-CM

## 2024-10-20 DIAGNOSIS — Z7901 Long term (current) use of anticoagulants: Secondary | ICD-10-CM

## 2024-10-20 LAB — POCT INR: INR: 2.2 (ref 2.0–3.0)

## 2024-10-20 NOTE — Progress Notes (Signed)
 Description   INR-2.2; Spoke with pt and advised to continue taking 1 tablet daily.  Stay consistent with greens each week (2 per week). Recheck INR in 2 weeks.  [facility] [phone]

## 2024-10-20 NOTE — Patient Instructions (Signed)
 Description   INR-2.2; Spoke with pt and advised to continue taking 1 tablet daily.  Stay consistent with greens each week (2 per week). Recheck INR in 2 weeks.  [facility] [phone]

## 2024-10-20 NOTE — Telephone Encounter (Signed)
 Spoke with patient regarding INR being due and he states he will get it done soon and thanks for the reminder since he forgot. Will await

## 2024-11-03 ENCOUNTER — Telehealth: Payer: Self-pay | Admitting: *Deleted

## 2024-11-03 NOTE — Telephone Encounter (Signed)
 Called patient since INR is due; there was no answer so left a message to call back.

## 2024-11-03 NOTE — Telephone Encounter (Signed)
 Left another message to call back regarding INR testing.

## 2024-11-04 ENCOUNTER — Telehealth: Payer: Self-pay | Admitting: *Deleted

## 2024-11-04 ENCOUNTER — Ambulatory Visit: Payer: Self-pay | Admitting: *Deleted

## 2024-11-04 DIAGNOSIS — Z7901 Long term (current) use of anticoagulants: Secondary | ICD-10-CM

## 2024-11-04 DIAGNOSIS — Z5181 Encounter for therapeutic drug level monitoring: Secondary | ICD-10-CM

## 2024-11-04 DIAGNOSIS — Z952 Presence of prosthetic heart valve: Secondary | ICD-10-CM

## 2024-11-04 LAB — POCT INR: INR: 3.3 — AB (ref 2.0–3.0)

## 2024-11-04 NOTE — Telephone Encounter (Signed)
 Called patient since INR was due on yesterday. There was no answer so left a message.

## 2024-11-04 NOTE — Telephone Encounter (Signed)
 Patient calling stating he is having difficulties with the [employer], Patient is requesting a call back to help set up monitor correctly

## 2024-11-04 NOTE — Telephone Encounter (Signed)
 Patient aware that he will need to call the company if unable to reset machine.

## 2024-11-04 NOTE — Patient Instructions (Signed)
 Description   INR-3.3; Spoke with pt and advised not to take any warfarin today then continue taking 1 tablet daily. Stay consistent with greens each week (2 per week). Recheck INR in 2 weeks.  [facility] [phone]

## 2024-11-04 NOTE — Progress Notes (Signed)
 Description   INR-3.3; Spoke with pt and advised not to take any warfarin today then continue taking 1 tablet daily. Stay consistent with greens each week (2 per week). Recheck INR in 2 weeks.  [facility] [phone]

## 2024-11-10 ENCOUNTER — Other Ambulatory Visit: Payer: Self-pay | Admitting: Internal Medicine

## 2024-11-18 ENCOUNTER — Telehealth: Payer: Self-pay | Admitting: *Deleted

## 2024-11-18 DIAGNOSIS — Z7901 Long term (current) use of anticoagulants: Secondary | ICD-10-CM

## 2024-11-18 NOTE — Addendum Note (Signed)
 Addended by: DIONISIO SENIOR B on: 11/18/2024 03:18 PM   Modules accepted: Orders

## 2024-11-18 NOTE — Telephone Encounter (Signed)
 Called patient since INR is due and he states he got a new number (which is updated in the system) and that he is having machine issues. He states he has a headache and the dogs are barking. Advised he should replace the batteries and then call the company to assist if that does not work. He states he call them but can't get nobody by pressing 00, advised he should call then and listen to the prompts and usually it is 0 for the operator. He states the number he has is from 2010, so I told him to call [phone] and listen to the prompts. Advised to follow up with us  later or it he is able to test to get it done and call back.

## 2024-11-18 NOTE — Telephone Encounter (Signed)
 Spoke with patient and he states he will come in tomorrow to have INR checked since he did not get in touch with the home testing company. He states he has to come when there is less traffic so there was a 330pm appt available and he agreed to come. Gave him the address: [address] and advised if he cannot come for any reason please call us  back.  He states he will bring his machine to see what he is doing wrong. Advised that coming in ma be a better option so he does not have to come so frequently.

## 2024-11-18 NOTE — Telephone Encounter (Signed)
 Called patient since INR is due; the phone rings one time then a busy signal comes on. Called again and busy signal. Will try back later.

## 2024-11-19 ENCOUNTER — Other Ambulatory Visit: Payer: Self-pay

## 2024-11-19 ENCOUNTER — Telehealth: Payer: Self-pay

## 2024-11-19 ENCOUNTER — Ambulatory Visit

## 2024-11-19 DIAGNOSIS — Z952 Presence of prosthetic heart valve: Secondary | ICD-10-CM

## 2024-11-19 DIAGNOSIS — Z7901 Long term (current) use of anticoagulants: Secondary | ICD-10-CM

## 2024-11-19 LAB — POCT INR: INR: 1.8 — AB (ref 2.0–3.0)

## 2024-11-19 MED ORDER — METOPROLOL SUCCINATE ER 50 MG PO TB24: 50.0000 mg | ORAL_TABLET | Freq: Every day | ORAL | 0 refills | Status: AC

## 2024-11-19 NOTE — Telephone Encounter (Signed)
 Patient is out of Metapropal (sp?) and needs a refill..he has switched his drug store to [facility] on [address]    11-19-24 VB

## 2024-11-19 NOTE — Patient Instructions (Signed)
 Take 1.5 tablets tonight only then continue taking 1 tablet daily. Stay consistent with greens each week (2 per week). Recheck INR in 2 weeks.  [facility] [phone]

## 2024-11-19 NOTE — Telephone Encounter (Signed)
 Called pt. Refill for metoprolol  sent to pharmacy. Pt will keep appt in March. He would like a year supply sent at office visit.

## 2024-11-19 NOTE — Progress Notes (Signed)
 INR 1.8   Take 1.5 tablets tonight only then continue taking 1 tablet daily. Stay consistent with greens each week (2 per week). Recheck INR in 2 weeks.  [facility] [phone]

## 2025-01-12 ENCOUNTER — Ambulatory Visit: Admitting: Internal Medicine
